# Patient Record
Sex: Male | Born: 1955
Health system: Southern US, Community
[De-identification: ages and names within clinical notes are randomized; demographics above are authoritative.]

## PROBLEM LIST (undated history)

## (undated) DIAGNOSIS — H269 Unspecified cataract: Secondary | ICD-10-CM

## (undated) DIAGNOSIS — Z972 Presence of dental prosthetic device (complete) (partial): Secondary | ICD-10-CM

## (undated) DIAGNOSIS — E7800 Pure hypercholesterolemia, unspecified: Secondary | ICD-10-CM

## (undated) DIAGNOSIS — K635 Polyp of colon: Secondary | ICD-10-CM

## (undated) DIAGNOSIS — Z973 Presence of spectacles and contact lenses: Secondary | ICD-10-CM

## (undated) DIAGNOSIS — M1990 Unspecified osteoarthritis, unspecified site: Secondary | ICD-10-CM

## (undated) DIAGNOSIS — C61 Malignant neoplasm of prostate: Secondary | ICD-10-CM

## (undated) HISTORY — DX: Pure hypercholesterolemia, unspecified: E78.00

## (undated) HISTORY — PX: KNEE ARTHROPLASTY: SHX992

## (undated) HISTORY — DX: Polyp of colon: K63.5

## (undated) HISTORY — PX: PROSTATE BIOPSY: SHX241

## (undated) HISTORY — DX: Unspecified cataract: H26.9

## (undated) HISTORY — DX: Unspecified osteoarthritis, unspecified site: M19.90

## (undated) HISTORY — PX: COLONOSCOPY: SHX174

---

## 2005-03-27 ENCOUNTER — Ambulatory Visit (HOSPITAL_COMMUNITY): Admission: RE | Admit: 2005-03-27 | Discharge: 2005-03-27 | Payer: Self-pay | Admitting: Family Medicine

## 2007-07-13 ENCOUNTER — Ambulatory Visit (HOSPITAL_COMMUNITY): Admission: RE | Admit: 2007-07-13 | Discharge: 2007-07-13 | Payer: Self-pay | Admitting: Gastroenterology

## 2007-07-13 ENCOUNTER — Ambulatory Visit: Payer: Self-pay | Admitting: Gastroenterology

## 2008-07-06 ENCOUNTER — Ambulatory Visit (HOSPITAL_COMMUNITY): Admission: RE | Admit: 2008-07-06 | Discharge: 2008-07-06 | Payer: Self-pay | Admitting: Family Medicine

## 2010-07-04 ENCOUNTER — Ambulatory Visit (HOSPITAL_COMMUNITY): Admission: RE | Admit: 2010-07-04 | Discharge: 2010-07-04 | Payer: Self-pay | Admitting: Family Medicine

## 2010-07-09 ENCOUNTER — Ambulatory Visit: Payer: Self-pay | Admitting: Oncology

## 2010-07-12 LAB — CBC WITH DIFFERENTIAL/PLATELET
BASO%: 0.4 % (ref 0.0–2.0)
Basophils Absolute: 0 10*3/uL (ref 0.0–0.1)
EOS%: 0.8 % (ref 0.0–7.0)
Eosinophils Absolute: 0 10*3/uL (ref 0.0–0.5)
HCT: 41 % (ref 38.4–49.9)
HGB: 13.3 g/dL (ref 13.0–17.1)
LYMPH%: 41.4 % (ref 14.0–49.0)
MCH: 28 pg (ref 27.2–33.4)
MCHC: 32.5 g/dL (ref 32.0–36.0)
MCV: 86.3 fL (ref 79.3–98.0)
MONO#: 0.5 10*3/uL (ref 0.1–0.9)
MONO%: 16.6 % — ABNORMAL HIGH (ref 0.0–14.0)
NEUT#: 1.2 10*3/uL — ABNORMAL LOW (ref 1.5–6.5)
NEUT%: 40.8 % (ref 39.0–75.0)
Platelets: 332 10*3/uL (ref 140–400)
RBC: 4.76 10*6/uL (ref 4.20–5.82)
RDW: 13.7 % (ref 11.0–14.6)
WBC: 2.9 10*3/uL — ABNORMAL LOW (ref 4.0–10.3)
lymph#: 1.2 10*3/uL (ref 0.9–3.3)

## 2010-07-12 LAB — COMPREHENSIVE METABOLIC PANEL
ALT: 15 U/L (ref 0–53)
AST: 17 U/L (ref 0–37)
Albumin: 4.3 g/dL (ref 3.5–5.2)
Alkaline Phosphatase: 58 U/L (ref 39–117)
BUN: 17 mg/dL (ref 6–23)
CO2: 26 mEq/L (ref 19–32)
Calcium: 9.9 mg/dL (ref 8.4–10.5)
Chloride: 105 mEq/L (ref 96–112)
Creatinine, Ser: 0.8 mg/dL (ref 0.40–1.50)
Glucose, Bld: 94 mg/dL (ref 70–99)
Potassium: 4 mEq/L (ref 3.5–5.3)
Sodium: 141 mEq/L (ref 135–145)
Total Bilirubin: 0.4 mg/dL (ref 0.3–1.2)
Total Protein: 7.7 g/dL (ref 6.0–8.3)

## 2010-07-12 LAB — LACTATE DEHYDROGENASE: LDH: 110 U/L (ref 94–250)

## 2010-07-12 LAB — CHCC SMEAR

## 2010-07-27 ENCOUNTER — Ambulatory Visit (HOSPITAL_COMMUNITY): Admission: RE | Admit: 2010-07-27 | Discharge: 2010-07-27 | Payer: Self-pay | Admitting: Family Medicine

## 2010-08-03 ENCOUNTER — Encounter (HOSPITAL_COMMUNITY)
Admission: RE | Admit: 2010-08-03 | Discharge: 2010-09-02 | Payer: Self-pay | Source: Home / Self Care | Attending: Family Medicine | Admitting: Family Medicine

## 2010-09-24 ENCOUNTER — Ambulatory Visit (HOSPITAL_COMMUNITY)
Admission: RE | Admit: 2010-09-24 | Discharge: 2010-09-24 | Payer: Self-pay | Source: Home / Self Care | Attending: Neurosurgery | Admitting: Neurosurgery

## 2010-10-06 ENCOUNTER — Encounter: Payer: Self-pay | Admitting: Family Medicine

## 2010-11-13 ENCOUNTER — Other Ambulatory Visit: Payer: Self-pay | Admitting: Oncology

## 2010-11-13 ENCOUNTER — Encounter (HOSPITAL_BASED_OUTPATIENT_CLINIC_OR_DEPARTMENT_OTHER): Payer: BC Managed Care – PPO | Admitting: Oncology

## 2010-11-13 DIAGNOSIS — D72819 Decreased white blood cell count, unspecified: Secondary | ICD-10-CM

## 2010-11-13 LAB — CBC WITH DIFFERENTIAL/PLATELET
BASO%: 0.8 % (ref 0.0–2.0)
Basophils Absolute: 0 10*3/uL (ref 0.0–0.1)
EOS%: 1.9 % (ref 0.0–7.0)
Eosinophils Absolute: 0.1 10*3/uL (ref 0.0–0.5)
HCT: 38 % — ABNORMAL LOW (ref 38.4–49.9)
HGB: 12.6 g/dL — ABNORMAL LOW (ref 13.0–17.1)
LYMPH%: 50.3 % — ABNORMAL HIGH (ref 14.0–49.0)
MCH: 29 pg (ref 27.2–33.4)
MCHC: 33.3 g/dL (ref 32.0–36.0)
MCV: 87.2 fL (ref 79.3–98.0)
MONO#: 0.6 10*3/uL (ref 0.1–0.9)
MONO%: 15.8 % — ABNORMAL HIGH (ref 0.0–14.0)
NEUT#: 1.2 10*3/uL — ABNORMAL LOW (ref 1.5–6.5)
NEUT%: 31.2 % — ABNORMAL LOW (ref 39.0–75.0)
Platelets: 273 10*3/uL (ref 140–400)
RBC: 4.36 10*6/uL (ref 4.20–5.82)
RDW: 13 % (ref 11.0–14.6)
WBC: 3.7 10*3/uL — ABNORMAL LOW (ref 4.0–10.3)
lymph#: 1.9 10*3/uL (ref 0.9–3.3)

## 2010-11-13 LAB — CHCC SMEAR

## 2011-01-29 NOTE — Op Note (Signed)
NAME:  Ricardo Pacheco, Ricardo Pacheco               ACCOUNT NO.:  [id number]   MEDICAL RECORD NO.:  [id number]          PATIENT TYPE:  AMB   LOCATION:  DAY                           FACILITY:  [facility]   PHYSICIAN:  Kassie Mends, M.D.      DATE OF BIRTH:  [birth year]   DATE OF PROCEDURE:  07/13/2007  DATE OF DISCHARGE:                               OPERATIVE REPORT   REFERRING PHYSICIAN:  Donna Bernard, M.D.   PROCEDURE:  Colonoscopy.   INDICATION FOR EXAM:  Ricardo Pacheco is a 55 year old male who presents for  average-risk colon cancer screening.   FINDINGS:  1. Frequent sigmoid diverticula.  Otherwise no polyps, masses,      inflammatory changes or AVMs seen.  2. Normal retroflexed view of the rectum.   DIAGNOSIS:  Mild sigmoid diverticulosis.   RECOMMENDATIONS:  1. Screening colonoscopy in 10 years.  2. He should follow a high-fiber diet.  He is given a handout on high-      fiber diet and diverticulosis.   MEDICATIONS:  1. Demerol 50 mg IV.  2. Versed 5 mg IV.   PROCEDURE TECHNIQUE:  Physical exam was performed.  Informed consent was  obtained from the patient after explaining the benefits and risks of the  procedure.  The alternatives were discussed.  The patient was connected  to the monitor and placed in the left lateral position.  Continuous  oxygen was provided by nasal cannula and IV medicine administered  through an indwelling cannula.  After administration of sedation and  rectal exam, the  patient's rectum was intubated and the scope was advanced under direct  visualization to the cecum.  The scope was removed slowly by carefully  examining the color, texture, anatomy and integrity of the mucosa on the  way out.  The patient was recovered in endoscopy and discharged home in  satisfactory condition.      Kassie Mends, M.D.  Electronically Signed     SM/MEDQ  D:  07/13/2007  T:  07/13/2007  Job:  [id number]   cc:   Donna Bernard, M.D.  Fax: [phone]

## 2011-03-26 ENCOUNTER — Other Ambulatory Visit: Payer: Self-pay | Admitting: Oncology

## 2011-03-26 ENCOUNTER — Encounter (HOSPITAL_BASED_OUTPATIENT_CLINIC_OR_DEPARTMENT_OTHER): Payer: BC Managed Care – PPO | Admitting: Oncology

## 2011-03-26 DIAGNOSIS — D72819 Decreased white blood cell count, unspecified: Secondary | ICD-10-CM

## 2011-03-26 LAB — COMPREHENSIVE METABOLIC PANEL
ALT: 16 U/L (ref 0–53)
AST: 16 U/L (ref 0–37)
Albumin: 4.1 g/dL (ref 3.5–5.2)
Alkaline Phosphatase: 57 U/L (ref 39–117)
BUN: 14 mg/dL (ref 6–23)
CO2: 25 mEq/L (ref 19–32)
Calcium: 9.6 mg/dL (ref 8.4–10.5)
Chloride: 106 mEq/L (ref 96–112)
Creatinine, Ser: 0.8 mg/dL (ref 0.50–1.35)
Glucose, Bld: 96 mg/dL (ref 70–99)
Potassium: 4 mEq/L (ref 3.5–5.3)
Sodium: 141 mEq/L (ref 135–145)
Total Bilirubin: 0.4 mg/dL (ref 0.3–1.2)
Total Protein: 7.6 g/dL (ref 6.0–8.3)

## 2011-03-26 LAB — CBC WITH DIFFERENTIAL/PLATELET
BASO%: 0.4 % (ref 0.0–2.0)
Basophils Absolute: 0 10*3/uL (ref 0.0–0.1)
EOS%: 1.9 % (ref 0.0–7.0)
Eosinophils Absolute: 0.1 10*3/uL (ref 0.0–0.5)
HCT: 40 % (ref 38.4–49.9)
HGB: 13.5 g/dL (ref 13.0–17.1)
LYMPH%: 38.3 % (ref 14.0–49.0)
MCH: 29.3 pg (ref 27.2–33.4)
MCHC: 33.8 g/dL (ref 32.0–36.0)
MCV: 86.9 fL (ref 79.3–98.0)
MONO#: 0.5 10*3/uL (ref 0.1–0.9)
MONO%: 14.5 % — ABNORMAL HIGH (ref 0.0–14.0)
NEUT#: 1.5 10*3/uL (ref 1.5–6.5)
NEUT%: 44.9 % (ref 39.0–75.0)
Platelets: 297 10*3/uL (ref 140–400)
RBC: 4.61 10*6/uL (ref 4.20–5.82)
RDW: 13.4 % (ref 11.0–14.6)
WBC: 3.2 10*3/uL — ABNORMAL LOW (ref 4.0–10.3)
lymph#: 1.2 10*3/uL (ref 0.9–3.3)

## 2011-08-28 ENCOUNTER — Telehealth: Payer: Self-pay | Admitting: Oncology

## 2011-08-28 NOTE — Telephone Encounter (Signed)
l/m on home # with appt info for 09/2011/aom

## 2011-10-14 ENCOUNTER — Encounter: Payer: Self-pay | Admitting: *Deleted

## 2011-10-16 ENCOUNTER — Ambulatory Visit: Payer: BC Managed Care – PPO | Admitting: Oncology

## 2011-10-16 ENCOUNTER — Other Ambulatory Visit: Payer: BC Managed Care – PPO | Admitting: Lab

## 2011-11-08 ENCOUNTER — Ambulatory Visit (HOSPITAL_BASED_OUTPATIENT_CLINIC_OR_DEPARTMENT_OTHER): Payer: Managed Care, Other (non HMO) | Admitting: Oncology

## 2011-11-08 ENCOUNTER — Other Ambulatory Visit: Payer: Managed Care, Other (non HMO) | Admitting: Lab

## 2011-11-08 DIAGNOSIS — D709 Neutropenia, unspecified: Secondary | ICD-10-CM

## 2011-11-08 LAB — CBC WITH DIFFERENTIAL/PLATELET
BASO%: 0.5 % (ref 0.0–2.0)
Basophils Absolute: 0 10*3/uL (ref 0.0–0.1)
EOS%: 0.9 % (ref 0.0–7.0)
Eosinophils Absolute: 0 10*3/uL (ref 0.0–0.5)
HCT: 41.7 % (ref 38.4–49.9)
HGB: 13.9 g/dL (ref 13.0–17.1)
LYMPH%: 35.4 % (ref 14.0–49.0)
MCH: 29 pg (ref 27.2–33.4)
MCHC: 33.2 g/dL (ref 32.0–36.0)
MCV: 87.4 fL (ref 79.3–98.0)
MONO#: 0.7 10*3/uL (ref 0.1–0.9)
MONO%: 14.4 % — ABNORMAL HIGH (ref 0.0–14.0)
NEUT#: 2.3 10*3/uL (ref 1.5–6.5)
NEUT%: 48.8 % (ref 39.0–75.0)
Platelets: 303 10*3/uL (ref 140–400)
RBC: 4.77 10*6/uL (ref 4.20–5.82)
RDW: 13.7 % (ref 11.0–14.6)
WBC: 4.8 10*3/uL (ref 4.0–10.3)
lymph#: 1.7 10*3/uL (ref 0.9–3.3)

## 2011-11-08 LAB — CHCC SMEAR

## 2011-11-08 NOTE — Progress Notes (Signed)
Hematology and Oncology Follow Up Visit  Ricardo Pacheco [id number] 04-04-1956 56 y.o. 11/08/2011 2:51 PM  CC: Ricardo Pacheco A. Gerda Diss, MD    Principle Diagnosis: This is a 56 year old gentleman with mild neutropenia most likely reactive versus due to ethnic variation.  Interim History: Ricardo Pacheco presents today for a followup visit.  A pleasant gentleman who I saw back in October 2011 for a mild neutropenia with absolute neutrophil count of 1200.  He had normal hemoglobin and hematocrit, normal platelet counts, normal peripheral smear and he is currently on active surveillance.  Since the last time I saw him he had not really reported any new symptoms from what I can tell.  He had not reported any opportunistic infection.  He had not reported any hospitalization.  He had not reported any sinopulmonary infection or skin infection.  His overall performance status and activity level remains excellent.  He does report some occasional mechanical back pain, but really overall unchanged.  Medications: I have reviewed the patient's current medications. Current outpatient prescriptions:chlorzoxazone (PARAFON) 500 MG tablet, Take 500 mg by mouth 4 (four) times daily as needed., Disp: , Rfl: ;  cyclobenzaprine (FLEXERIL) 10 MG tablet, Take 10 mg by mouth 3 (three) times daily as needed., Disp: , Rfl: ;  diazepam (VALIUM) 10 MG tablet, Take 10 mg by mouth every 6 (six) hours as needed., Disp: , Rfl:  HYDROcodone-acetaminophen (VICODIN) 5-500 MG per tablet, Take 1 tablet by mouth every 6 (six) hours as needed., Disp: , Rfl: ;  Multiple Vitamins-Minerals (MULTIVITAMIN PO), Take by mouth., Disp: , Rfl: ;  nabumetone (RELAFEN) 500 MG tablet, Take 500 mg by mouth 2 (two) times daily., Disp: , Rfl:   Allergies: No Known Allergies  Past Medical History, Surgical history, Social history, and Family History were reviewed and updated.  Review of Systems: Constitutional:  Negative for fever, chills, night sweats, anorexia, weight  loss, pain. Cardiovascular: no chest pain or dyspnea on exertion Respiratory: no cough, shortness of breath, or wheezing Neurological: no TIA or stroke symptoms Dermatological: negative ENT: negative Skin: Negative. Gastrointestinal: no abdominal pain, change in bowel habits, or black or bloody stools Genito-Urinary: no dysuria, trouble voiding, or hematuria Hematological and Lymphatic: negative Breast: negative Musculoskeletal: negative Remaining ROS negative. Physical Exam: Blood pressure 105/71, pulse 78, temperature 97.8 F (36.6 C), temperature source Oral, height 6\' 1"  (1.854 m), weight 200 lb 4.8 oz (90.855 kg). ECOG:  General appearance: alert Head: Normocephalic, without obvious abnormality, atraumatic Neck: no adenopathy, no carotid bruit, no JVD, supple, symmetrical, trachea midline and thyroid not enlarged, symmetric, no tenderness/mass/nodules Lymph nodes: Cervical, supraclavicular, and axillary nodes normal. Heart:regular rate and rhythm, S1, S2 normal, no murmur, click, rub or gallop Lung:chest clear, no wheezing, rales, normal symmetric air entry Abdomin: soft, non-tender, without masses or organomegaly EXT:no erythema, induration, or nodules   Lab Results: Lab Results  Component Value Date   WBC 4.8 11/08/2011   HGB 13.9 11/08/2011   HCT 41.7 11/08/2011   MCV 87.4 11/08/2011   PLT 303 11/08/2011     Chemistry      Component Value Date/Time   NA 141 03/26/2011 0937   K 4.0 03/26/2011 0937   CL 106 03/26/2011 0937   CO2 25 03/26/2011 0937   BUN 14 03/26/2011 0937   CREATININE 0.80 03/26/2011 0937      Component Value Date/Time   CALCIUM 9.6 03/26/2011 0937   ALKPHOS 57 03/26/2011 0937   AST 16 03/26/2011 0937   ALT 16 03/26/2011  1610   BILITOT 0.4 03/26/2011 0937      Impression and Plan:  56 year old gentleman with the following issues. 1. Neutropenia.  His absolute neutrophil count today is normal and his neutropenia has resolved.  I do not think he has a  lymphoproliferative disorder.  I think in all likelihood this is a more reactive or ethnic variation related and again it looks like from a benign etiology.  For the time being do not recommend any further intervention.  I do not recommend any bone marrow biopsy.  No further hematology work up is needed. Follow up will be as needed. I will be happy to see him in the future if things change. 2. Back pain that is osteoarthritic in nature.  No major changes, follow up with his PCP  [facility], MD 2/22/20132:51 PM

## 2012-12-11 ENCOUNTER — Ambulatory Visit (INDEPENDENT_AMBULATORY_CARE_PROVIDER_SITE_OTHER): Payer: Managed Care, Other (non HMO) | Admitting: Family Medicine

## 2012-12-11 ENCOUNTER — Encounter: Payer: Self-pay | Admitting: Family Medicine

## 2012-12-11 VITALS — BP 110/70 | Temp 98.3°F | Ht 73.0 in | Wt 216.2 lb

## 2012-12-11 DIAGNOSIS — N400 Enlarged prostate without lower urinary tract symptoms: Secondary | ICD-10-CM

## 2012-12-11 DIAGNOSIS — M722 Plantar fascial fibromatosis: Secondary | ICD-10-CM | POA: Insufficient documentation

## 2012-12-11 DIAGNOSIS — G8929 Other chronic pain: Secondary | ICD-10-CM

## 2012-12-11 DIAGNOSIS — M549 Dorsalgia, unspecified: Secondary | ICD-10-CM

## 2012-12-11 MED ORDER — HYDROCODONE-ACETAMINOPHEN 10-325 MG PO TABS: 1.0000 | ORAL_TABLET | Freq: Four times a day (QID) | ORAL | Status: DC | PRN

## 2012-12-11 MED ORDER — MAGIC MOUTHWASH: 15.0000 mL | Freq: Four times a day (QID) | ORAL | Status: DC

## 2012-12-11 NOTE — Progress Notes (Signed)
  Subjective:    Patient ID: Ricardo Pacheco, male    DOB: [birth year], 57 y.o.   MRN: [id number]  HPI  Feet and ankles swelling intermittently. Worse after on feet. Wears steel toes. Painful heels.exercising regularly. At the gym. Left lumbar pain, chronic. Not yet ready for surg patient also notes soreness of tongue. Used to yeast extract on tongue and developed a sore and painful tongue.. Sensitive now. Claims compliance with his medications. Use his pain medicine sparingly for his chronic back pain. Also on Flomax for his chronic prostate symptoms. ROS otherwise negative.  Review of Systems ROS otherwise negative.    Objective:   Physical Exam Alert vital signs reviewed. HEENT reveals glossitis of tongue neck supple. Lungs clear. Heart rare rhythm. Low back tender to percussion.       Assessment & Plan:  Impression acute glossitis. #2 chronic back pain discussed. #3 prostate hypertrophy discussed. Plan maintain same meds. Medications refilled. followup the summer for physical exam. WSL

## 2012-12-11 NOTE — Patient Instructions (Signed)
Call dr. Nolen Mu in [place] for feet.

## 2012-12-11 NOTE — Progress Notes (Deleted)
  Subjective:    Patient ID: Ricardo Pacheco, male    DOB: 28-Jan-1956, 57 y.o.   MRN: [id number]  HPI Subjective:    Ricardo Pacheco is a 57 y.o. male who presents for evaluation of low back pain. The patient has had {history; pain back:5285::"recurrent self limited episodes of low back pain in the past"}. Symptoms have been present for {1-10:13787} {units:19031} and are {clinical course - history:17::"unchanged"}.  Onset was related to / precipitated by {causes; back pain:32249::"no known injury"}. The pain is located in the {back pain location:31199} and {radiation:20410}. The pain is describedas {pain quality:31200} and occurs {timing:31009}. {Pain rating:20411} Symptoms are exacerbated by {causes; aggravators pain back:31424}. Symptoms are improved by {pain treatments:32172}. He has also tried {pain treatments:32172} Uses pai {Common ambulatory SmartLinks:19316}  Review of Systems {ros - complete:30496}    Objective:   {Exam; back exam:5796::"Full range of motion without pain, no tenderness, no spasm, no curvature.","Normal reflexes, gait, strength and negative straight-leg raise."}    Assessment:    {back diagnosis:16452}    Plan:    {Plan; back pain:10213}    Review of Systems     Objective:   Physical Exam        Assessment & Plan:

## 2013-02-09 ENCOUNTER — Other Ambulatory Visit: Payer: Self-pay | Admitting: Family Medicine

## 2013-04-16 ENCOUNTER — Encounter: Payer: Self-pay | Admitting: Family Medicine

## 2013-04-16 ENCOUNTER — Ambulatory Visit (INDEPENDENT_AMBULATORY_CARE_PROVIDER_SITE_OTHER): Payer: Managed Care, Other (non HMO) | Admitting: Family Medicine

## 2013-04-16 VITALS — BP 120/78 | HR 70 | Ht 71.75 in | Wt 212.0 lb

## 2013-04-16 DIAGNOSIS — Z0000 Encounter for general adult medical examination without abnormal findings: Secondary | ICD-10-CM

## 2013-04-16 DIAGNOSIS — N400 Enlarged prostate without lower urinary tract symptoms: Secondary | ICD-10-CM

## 2013-04-16 DIAGNOSIS — M549 Dorsalgia, unspecified: Secondary | ICD-10-CM

## 2013-04-16 DIAGNOSIS — G8929 Other chronic pain: Secondary | ICD-10-CM

## 2013-04-16 DIAGNOSIS — Z79899 Other long term (current) drug therapy: Secondary | ICD-10-CM

## 2013-04-16 DIAGNOSIS — Z125 Encounter for screening for malignant neoplasm of prostate: Secondary | ICD-10-CM

## 2013-04-16 NOTE — Progress Notes (Signed)
Subjective:    Patient ID: Ricardo Pacheco, male    DOB: [birth year], 57 y.o.   MRN: [id number]  HPI Urinating better up less often at night. Tolerating flomax  Diet over all good Results for orders placed in visit on 11/08/11  CBC WITH DIFFERENTIAL      Result Value Range   WBC 4.8  4.0 - 10.3 10e3/uL   NEUT# 2.3  1.5 - 6.5 10e3/uL   HGB 13.9  13.0 - 17.1 g/dL   HCT 40.9  81.1 - 91.4 %   Platelets 303  140 - 400 10e3/uL   MCV 87.4  79.3 - 98.0 fL   MCH 29.0  27.2 - 33.4 pg   MCHC 33.2  32.0 - 36.0 g/dL   RBC 7.82  9.56 - 2.13 10e6/uL   RDW 13.7  11.0 - 14.6 %   lymph# 1.7  0.9 - 3.3 10e3/uL   MONO# 0.7  0.1 - 0.9 10e3/uL   Eosinophils Absolute 0.0  0.0 - 0.5 10e3/uL   Basophils Absolute 0.0  0.0 - 0.1 10e3/uL   NEUT% 48.8  39.0 - 75.0 %   LYMPH% 35.4  14.0 - 49.0 %   MONO% 14.4 (*) 0.0 - 14.0 %   EOS% 0.9  0.0 - 7.0 %   BASO% 0.5  0.0 - 2.0 %  [facility] SMEAR      Result Value Range   Smear Result Smear Available     Eats mmstly good foods  On veggies and fruits. Chicken and [place] etc.   exercising mostly regularly. Last colonoscopy 2008 and clear for 10 years.  Review of Systems  Constitutional: Negative for fever, activity change and appetite change.  HENT: Negative for congestion, rhinorrhea and neck pain.   Eyes: Negative for discharge.  Respiratory: Negative for cough and wheezing.   Cardiovascular: Negative for chest pain.  Gastrointestinal: Negative for vomiting, abdominal pain and blood in stool.  Genitourinary: Positive for frequency. Negative for difficulty urinating.  Musculoskeletal: Positive for back pain.  Skin: Negative for rash.  Allergic/Immunologic: Negative for environmental allergies and food allergies.  Neurological: Negative for weakness and headaches.  Psychiatric/Behavioral: Negative for agitation.  All other systems reviewed and are negative.       Objective:   Physical Exam  Vitals reviewed. Constitutional: He appears well-developed and  well-nourished.  HENT:  Head: Normocephalic and atraumatic.  Right Ear: External ear normal.  Left Ear: External ear normal.  Nose: Nose normal.  Mouth/Throat: Oropharynx is clear and moist.  Eyes: EOM are normal. Pupils are equal, round, and reactive to light.  Neck: Normal range of motion. Neck supple. No thyromegaly present.  Cardiovascular: Normal rate, regular rhythm and normal heart sounds.   No murmur heard. Pulmonary/Chest: Effort normal and breath sounds normal. No respiratory distress. He has no wheezes.  Abdominal: Soft. Bowel sounds are normal. He exhibits no distension and no mass. There is no tenderness.  Genitourinary: Penis normal.  Musculoskeletal: Normal range of motion. He exhibits no edema.  Lymphadenopathy:    He has no cervical adenopathy.  Neurological: He is alert. He exhibits normal muscle tone.  Skin: Skin is warm and dry. No erythema.  Multiple areas associated with actinic keratosis  Psychiatric: He has a normal mood and affect. His behavior is normal. Judgment normal.          Assessment & Plan:  Impression wellness exam #2 chronic back pain. Patient uses hydrocodone. We prescribed 40 per prescription. #3 prostate hypertrophy plan appropriate blood  work. Hemoccult cards. Diet exercise discussed in encourage. Advised we'll likely need to see in 6 months with new regulations regarding narcotic prescriptions.

## 2013-04-24 LAB — BASIC METABOLIC PANEL
BUN: 17 mg/dL (ref 6–23)
CO2: 31 mEq/L (ref 19–32)
Calcium: 9.6 mg/dL (ref 8.4–10.5)
Chloride: 104 mEq/L (ref 96–112)
Creat: 0.92 mg/dL (ref 0.50–1.35)
Glucose, Bld: 97 mg/dL (ref 70–99)
Potassium: 4.7 mEq/L (ref 3.5–5.3)
Sodium: 139 mEq/L (ref 135–145)

## 2013-04-24 LAB — HEPATIC FUNCTION PANEL
ALT: 17 U/L (ref 0–53)
AST: 18 U/L (ref 0–37)
Albumin: 3.9 g/dL (ref 3.5–5.2)
Alkaline Phosphatase: 62 U/L (ref 39–117)
Bilirubin, Direct: 0.1 mg/dL (ref 0.0–0.3)
Indirect Bilirubin: 0.3 mg/dL (ref 0.0–0.9)
Total Bilirubin: 0.4 mg/dL (ref 0.3–1.2)
Total Protein: 7.3 g/dL (ref 6.0–8.3)

## 2013-04-24 LAB — LIPID PANEL
Cholesterol: 206 mg/dL — ABNORMAL HIGH (ref 0–200)
HDL: 57 mg/dL (ref >39)
LDL Cholesterol: 137 mg/dL — ABNORMAL HIGH (ref 0–99)
Total CHOL/HDL Ratio: 3.6 Ratio
Triglycerides: 59 mg/dL (ref <150)
VLDL: 12 mg/dL (ref 0–40)

## 2013-04-24 LAB — PSA: PSA: 1.42 ng/mL (ref <=4.00)

## 2013-04-25 ENCOUNTER — Encounter: Payer: Self-pay | Admitting: Family Medicine

## 2013-05-03 ENCOUNTER — Other Ambulatory Visit (INDEPENDENT_AMBULATORY_CARE_PROVIDER_SITE_OTHER): Payer: Managed Care, Other (non HMO) | Admitting: *Deleted

## 2013-05-03 DIAGNOSIS — Z0000 Encounter for general adult medical examination without abnormal findings: Secondary | ICD-10-CM

## 2013-05-03 LAB — POC HEMOCCULT BLD/STL (HOME/3-CARD/SCREEN)
Card #2 Fecal Occult Blod, POC: NEGATIVE
Card #3 Fecal Occult Blood, POC: NEGATIVE
Fecal Occult Blood, POC: NEGATIVE

## 2013-07-09 ENCOUNTER — Telehealth: Payer: Self-pay | Admitting: Family Medicine

## 2013-07-09 ENCOUNTER — Other Ambulatory Visit: Payer: Self-pay

## 2013-07-09 DIAGNOSIS — G8929 Other chronic pain: Secondary | ICD-10-CM

## 2013-07-09 MED ORDER — HYDROCODONE-ACETAMINOPHEN 10-325 MG PO TABS: 1.0000 | ORAL_TABLET | Freq: Four times a day (QID) | ORAL | Status: DC | PRN

## 2013-07-09 NOTE — Telephone Encounter (Signed)
Last office visit 04/16/13.

## 2013-07-09 NOTE — Telephone Encounter (Signed)
° °  Medication HYDROcodone-acetaminophen (NORCO) 10-325 MG per tablet [[id number]]                                 HYDROcodone-acetaminophen (NORCO) 10-325 MG per tablet [[id number]]   Order Details    Dose: 1 tablet Route: Oral Frequency: Every 6 hours PRN for pain    Dispense Quantity: 40 tablet Refills: 5 Fills Remaining: 5            Sig: Take 1 tablet by mouth every 6 (six) hours as needed for pain.           Written Date: 12/11/12 Expiration Date: 06/09/13      Start Date: 12/11/12 End Date: --      Ordering Provider: -- Authorizing Provider: Merlyn Albert, MD Ordering User: Merlyn Albert, MD            Diagnosis Association: Chronic back pain (724.5 , 338.29)           Original Order: HYDROcodone-acetaminophen (NORCO) 10-325 MG per tablet [[id number]]        Pharmacy: [facility] - [place], [place] - [address]       Pharmacy Comments: --           Quantity Remaining: 200 tablet Quantity Filled: 0 tablet             Order Class    Print            All Administrations of HYDROcodone-acetaminophen (NORCO) 10-325 MG per tablet    No Administrations Recorded                  Warnings History    No Warning History Available                Order History Outpatient    Date/Time Action Taken User Additional Information   12/11/12 1504 Sign Merlyn Albert, MD Reorder from Order: [phone]              HYDROcodone-acetaminophen (NORCO) 10-325 MG per tablet   Please refill

## 2013-07-09 NOTE — Telephone Encounter (Signed)
Ok times one. Explain new situation

## 2013-07-09 NOTE — Telephone Encounter (Signed)
Notified patient prescription is ready for pickup. Explained to patient the new guidelines for pain medication.

## 2013-09-15 ENCOUNTER — Encounter: Payer: Self-pay | Admitting: Family Medicine

## 2013-09-15 ENCOUNTER — Ambulatory Visit (INDEPENDENT_AMBULATORY_CARE_PROVIDER_SITE_OTHER): Payer: Managed Care, Other (non HMO) | Admitting: Family Medicine

## 2013-09-15 VITALS — BP 132/88 | Ht 73.0 in | Wt 220.4 lb

## 2013-09-15 DIAGNOSIS — G8929 Other chronic pain: Secondary | ICD-10-CM

## 2013-09-15 DIAGNOSIS — M549 Dorsalgia, unspecified: Secondary | ICD-10-CM

## 2013-09-15 MED ORDER — HYDROCODONE-ACETAMINOPHEN 10-325 MG PO TABS: 1.0000 | ORAL_TABLET | Freq: Four times a day (QID) | ORAL | Status: DC | PRN

## 2013-09-15 MED ORDER — NABUMETONE 500 MG PO TABS: ORAL_TABLET | ORAL | Status: DC

## 2013-09-15 MED ORDER — TAMSULOSIN HCL 0.4 MG PO CAPS: 0.4000 mg | ORAL_CAPSULE | Freq: Every day | ORAL | Status: DC

## 2013-09-15 MED ORDER — CYCLOBENZAPRINE HCL 10 MG PO TABS: ORAL_TABLET | ORAL | Status: DC

## 2013-09-15 NOTE — Progress Notes (Signed)
   Subjective:    Patient ID: Ricardo Pacheco, male    DOB: 20-Dec-1955, 57 y.o.   MRN: [id number]  HPI Patient is here today to get a refill on his medications, especially his Hydrocodone.   Patient has no concerns.  Uses it two or three times per wk,  Back pain when it does flare up is fairly severe. Primarily in the lumbar region. Please see prior notes. Not surgical at this time. Needs hydrocodone to help the severe pain. Anti-inflammatory medications are not enough.   Review of Systems No chest pain no change in urinary or bowel habits no blood in stool ROS otherwise negative    Objective:   Physical Exam  Alert no apparent distress vitals stable. Lungs clear. Heart regular in rhythm. Lumbar region tender to palpation. Negative to straight leg raise today.      Assessment & Plan:  Impression 1 chronic back pain with need for narcotics-discussed plan exercise encourage medicines refilled. #60 hydrocodone with one refill. Patient uses only sporadically so we can see every 6 months. Do not forget to do screening physicals. WSL

## 2014-01-01 ENCOUNTER — Other Ambulatory Visit: Payer: Self-pay | Admitting: Family Medicine

## 2014-01-03 NOTE — Telephone Encounter (Signed)
Last seen 09/15/13

## 2014-05-06 ENCOUNTER — Encounter: Payer: Self-pay | Admitting: Family Medicine

## 2014-05-06 ENCOUNTER — Ambulatory Visit (INDEPENDENT_AMBULATORY_CARE_PROVIDER_SITE_OTHER): Payer: Managed Care, Other (non HMO) | Admitting: Family Medicine

## 2014-05-06 VITALS — BP 120/84 | Ht 71.75 in | Wt 215.0 lb

## 2014-05-06 DIAGNOSIS — Z0000 Encounter for general adult medical examination without abnormal findings: Secondary | ICD-10-CM

## 2014-05-06 DIAGNOSIS — M549 Dorsalgia, unspecified: Secondary | ICD-10-CM

## 2014-05-06 DIAGNOSIS — G8929 Other chronic pain: Secondary | ICD-10-CM

## 2014-05-06 DIAGNOSIS — Z79899 Other long term (current) drug therapy: Secondary | ICD-10-CM

## 2014-05-06 DIAGNOSIS — Z125 Encounter for screening for malignant neoplasm of prostate: Secondary | ICD-10-CM

## 2014-05-06 MED ORDER — HYDROCODONE-ACETAMINOPHEN 10-325 MG PO TABS: 1.0000 | ORAL_TABLET | Freq: Four times a day (QID) | ORAL | Status: DC | PRN
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
## 2014-05-07 ENCOUNTER — Encounter: Payer: Self-pay | Admitting: *Deleted

## 2014-05-07 LAB — BASIC METABOLIC PANEL
BUN: 18 mg/dL (ref 6–23)
CO2: 27 mEq/L (ref 19–32)
Calcium: 9.1 mg/dL (ref 8.4–10.5)
Chloride: 106 mEq/L (ref 96–112)
Creat: 0.81 mg/dL (ref 0.50–1.35)
Glucose, Bld: 109 mg/dL — ABNORMAL HIGH (ref 70–99)
Potassium: 4 mEq/L (ref 3.5–5.3)
Sodium: 140 mEq/L (ref 135–145)

## 2014-05-07 LAB — LIPID PANEL
Cholesterol: 207 mg/dL — ABNORMAL HIGH (ref 0–200)
HDL: 64 mg/dL (ref >39)
LDL Cholesterol: 134 mg/dL — ABNORMAL HIGH (ref 0–99)
Total CHOL/HDL Ratio: 3.2 Ratio
Triglycerides: 47 mg/dL (ref <150)
VLDL: 9 mg/dL (ref 0–40)

## 2014-05-07 LAB — HEPATIC FUNCTION PANEL
ALT: 20 U/L (ref 0–53)
AST: 16 U/L (ref 0–37)
Albumin: 4 g/dL (ref 3.5–5.2)
Alkaline Phosphatase: 54 U/L (ref 39–117)
Bilirubin, Direct: 0.1 mg/dL (ref 0.0–0.3)
Indirect Bilirubin: 0.3 mg/dL (ref 0.2–1.2)
Total Bilirubin: 0.4 mg/dL (ref 0.2–1.2)
Total Protein: 7.2 g/dL (ref 6.0–8.3)

## 2014-05-09 LAB — PSA: PSA: 1.35 ng/mL (ref <=4.00)

## 2014-05-17 LAB — POC HEMOCCULT BLD/STL (HOME/3-CARD/SCREEN)
Card #2 Fecal Occult Blod, POC: NEGATIVE
Card #3 Fecal Occult Blood, POC: NEGATIVE
Fecal Occult Blood, POC: NEGATIVE

## 2014-09-05 ENCOUNTER — Other Ambulatory Visit: Payer: Self-pay | Admitting: Family Medicine

## 2015-05-05 ENCOUNTER — Telehealth: Payer: Self-pay | Admitting: Family Medicine

## 2015-05-05 DIAGNOSIS — Z13220 Encounter for screening for lipoid disorders: Secondary | ICD-10-CM

## 2015-05-05 DIAGNOSIS — N400 Enlarged prostate without lower urinary tract symptoms: Secondary | ICD-10-CM

## 2015-05-05 DIAGNOSIS — Z79899 Other long term (current) drug therapy: Secondary | ICD-10-CM

## 2015-05-05 NOTE — Telephone Encounter (Signed)
Pt needs labs for appt,  Aware of [facility]  Last labs 05/06/14   Hep, PSA, BMP, Lip

## 2015-05-08 NOTE — Addendum Note (Signed)
Addended by: Launa Grill on: 05/08/2015 09:08 AM   Modules accepted: Orders

## 2015-05-08 NOTE — Telephone Encounter (Signed)
Spoke with patient and informed him that the following labs were ordered for [facility]: Hepatic, PSA, BMP, and Lipid. Patient verbalized understanding.

## 2015-05-08 NOTE — Telephone Encounter (Signed)
Rep same 

## 2015-05-13 LAB — BASIC METABOLIC PANEL
BUN/Creatinine Ratio: 21 — ABNORMAL HIGH (ref 9–20)
BUN: 16 mg/dL (ref 6–24)
CO2: 24 mmol/L (ref 18–29)
Calcium: 9.5 mg/dL (ref 8.7–10.2)
Chloride: 104 mmol/L (ref 97–108)
Creatinine, Ser: 0.78 mg/dL (ref 0.76–1.27)
GFR calc Af Amer: 114 mL/min/{1.73_m2} (ref >59)
GFR calc non Af Amer: 99 mL/min/{1.73_m2} (ref >59)
Glucose: 101 mg/dL — ABNORMAL HIGH (ref 65–99)
Potassium: 4.1 mmol/L (ref 3.5–5.2)
Sodium: 142 mmol/L (ref 134–144)

## 2015-05-13 LAB — PSA: Prostate Specific Ag, Serum: 1.7 ng/mL (ref 0.0–4.0)

## 2015-05-13 LAB — LIPID PANEL
Chol/HDL Ratio: 3.6 ratio units (ref 0.0–5.0)
Cholesterol, Total: 204 mg/dL — ABNORMAL HIGH (ref 100–199)
HDL: 56 mg/dL (ref >39)
LDL Calculated: 137 mg/dL — ABNORMAL HIGH (ref 0–99)
Triglycerides: 56 mg/dL (ref 0–149)
VLDL Cholesterol Cal: 11 mg/dL (ref 5–40)

## 2015-05-13 LAB — HEPATIC FUNCTION PANEL
ALT: 19 IU/L (ref 0–44)
AST: 17 IU/L (ref 0–40)
Albumin: 3.9 g/dL (ref 3.5–5.5)
Alkaline Phosphatase: 61 IU/L (ref 39–117)
Bilirubin Total: 0.2 mg/dL (ref 0.0–1.2)
Bilirubin, Direct: 0.08 mg/dL (ref 0.00–0.40)
Total Protein: 7.3 g/dL (ref 6.0–8.5)

## 2015-05-19 ENCOUNTER — Ambulatory Visit (INDEPENDENT_AMBULATORY_CARE_PROVIDER_SITE_OTHER): Payer: Managed Care, Other (non HMO) | Admitting: Family Medicine

## 2015-05-19 ENCOUNTER — Encounter: Payer: Self-pay | Admitting: Family Medicine

## 2015-05-19 VITALS — BP 110/64 | Ht 71.25 in | Wt 220.0 lb

## 2015-05-19 DIAGNOSIS — Z0000 Encounter for general adult medical examination without abnormal findings: Secondary | ICD-10-CM

## 2015-05-19 DIAGNOSIS — G8929 Other chronic pain: Secondary | ICD-10-CM | POA: Diagnosis not present

## 2015-05-19 DIAGNOSIS — M549 Dorsalgia, unspecified: Secondary | ICD-10-CM | POA: Diagnosis not present

## 2015-05-19 DIAGNOSIS — N400 Enlarged prostate without lower urinary tract symptoms: Secondary | ICD-10-CM | POA: Diagnosis not present

## 2015-05-19 MED ORDER — HYDROCODONE-ACETAMINOPHEN 10-325 MG PO TABS: 1.0000 | ORAL_TABLET | Freq: Four times a day (QID) | ORAL | Status: DC | PRN

## 2015-05-19 MED ORDER — CHLORZOXAZONE 500 MG PO TABS: ORAL_TABLET | ORAL | Status: DC

## 2015-05-19 MED ORDER — NABUMETONE 500 MG PO TABS: ORAL_TABLET | ORAL | Status: DC

## 2015-05-19 MED ORDER — CYCLOBENZAPRINE HCL 10 MG PO TABS: ORAL_TABLET | ORAL | Status: DC

## 2015-05-19 MED ORDER — TAMSULOSIN HCL 0.4 MG PO CAPS: 0.4000 mg | ORAL_CAPSULE | Freq: Every day | ORAL | Status: DC

## 2015-05-19 NOTE — Patient Instructions (Signed)
Results for orders placed or performed in visit on 05/05/15  Hepatic function panel  Result Value Ref Range   Total Protein 7.3 6.0 - 8.5 g/dL   Albumin 3.9 3.5 - 5.5 g/dL   Bilirubin Total 0.2 0.0 - 1.2 mg/dL   Bilirubin, Direct 0.08 0.00 - 0.40 mg/dL   Alkaline Phosphatase 61 39 - 117 IU/L   AST 17 0 - 40 IU/L   ALT 19 0 - 44 IU/L  PSA  Result Value Ref Range   Prostate Specific Ag, Serum 1.7 0.0 - 4.0 ng/mL  Basic metabolic panel  Result Value Ref Range   Glucose 101 (H) 65 - 99 mg/dL   BUN 16 6 - 24 mg/dL   Creatinine, Ser 0.78 0.76 - 1.27 mg/dL   GFR calc non Af Amer 99 >59 mL/min/1.73   GFR calc Af Amer 114 >59 mL/min/1.73   BUN/Creatinine Ratio 21 (H) 9 - 20   Sodium 142 134 - 144 mmol/L   Potassium 4.1 3.5 - 5.2 mmol/L   Chloride 104 97 - 108 mmol/L   CO2 24 18 - 29 mmol/L   Calcium 9.5 8.7 - 10.2 mg/dL  Lipid panel  Result Value Ref Range   Cholesterol, Total 204 (H) 100 - 199 mg/dL   Triglycerides 56 0 - 149 mg/dL   HDL 56 >39 mg/dL   VLDL Cholesterol Cal 11 5 - 40 mg/dL   LDL Calculated 137 (H) 0 - 99 mg/dL   Chol/HDL Ratio 3.6 0.0 - 5.0 ratio units

## 2015-05-19 NOTE — Progress Notes (Signed)
Subjective:    Patient ID: Ricardo Pacheco, male    DOB: [birth year], 59 y.o.   MRN: [id number]  HPI The patient comes in today for a wellness visit.  2008 neg colon ten yr rec  A review of their health history was completed.  A review of medications was also completed.  Any needed refills; need refills on all meds. Takes hydrocodone for back pain. Med helps with back pain.Back pain off and on, causing some challenges.  Uses prn hydrocodone, recently flared up   Pt states he usually gets two scripts and that last until his next physical.   Eating habits: health conscious  Falls/  MVA accidents in past few months: none  Regular exercise: walks most days, goes to gym some  Specialist pt sees on regular basis: none  Preventative health issues were discussed.   Additional concerns: fatigue. Wants something to boost energy. Over the past few months, noticed fatigue and tiredness,  Not always the best sleep.  Just one coffee daily at work Results for orders placed or performed in visit on 05/05/15  Hepatic function panel  Result Value Ref Range   Total Protein 7.3 6.0 - 8.5 g/dL   Albumin 3.9 3.5 - 5.5 g/dL   Bilirubin Total 0.2 0.0 - 1.2 mg/dL   Bilirubin, Direct 0.08 0.00 - 0.40 mg/dL   Alkaline Phosphatase 61 39 - 117 IU/L   AST 17 0 - 40 IU/L   ALT 19 0 - 44 IU/L  PSA  Result Value Ref Range   Prostate Specific Ag, Serum 1.7 0.0 - 4.0 ng/mL  Basic metabolic panel  Result Value Ref Range   Glucose 101 (H) 65 - 99 mg/dL   BUN 16 6 - 24 mg/dL   Creatinine, Ser 0.78 0.76 - 1.27 mg/dL   GFR calc non Af Amer 99 >59 mL/min/1.73   GFR calc Af Amer 114 >59 mL/min/1.73   BUN/Creatinine Ratio 21 (H) 9 - 20   Sodium 142 134 - 144 mmol/L   Potassium 4.1 3.5 - 5.2 mmol/L   Chloride 104 97 - 108 mmol/L   CO2 24 18 - 29 mmol/L   Calcium 9.5 8.7 - 10.2 mg/dL  Lipid panel  Result Value Ref Range   Cholesterol, Total 204 (H) 100 - 199 mg/dL   Triglycerides 56 0 - 149 mg/dL   HDL 56 >39 mg/dL   VLDL Cholesterol Cal 11 5 - 40 mg/dL   LDL Calculated 137 (H) 0 - 99 mg/dL   Chol/HDL Ratio 3.6 0.0 - 5.0 ratio units    For a while now, running tired Back pain.      Review of Systems  Constitutional: Negative for fever, activity change and appetite change.  HENT: Negative for congestion and rhinorrhea.   Eyes: Negative for discharge.  Respiratory: Negative for cough and wheezing.   Cardiovascular: Negative for chest pain.  Gastrointestinal: Negative for vomiting, abdominal pain and blood in stool.  Genitourinary: Negative for frequency and difficulty urinating.  Musculoskeletal: Negative for neck pain.  Skin: Negative for rash.  Allergic/Immunologic: Negative for environmental allergies and food allergies.  Neurological: Negative for weakness and headaches.  Psychiatric/Behavioral: Negative for agitation.  All other systems reviewed and are negative.  medications for prostate hypertrophy still working and handling well.     Objective:   Physical Exam  Constitutional: He appears well-developed and well-nourished.  HENT:  Head: Normocephalic and atraumatic.  Right Ear: External ear normal.  Left Ear: External ear normal.  Nose: Nose normal.  Mouth/Throat: Oropharynx is clear and moist.  Eyes: EOM are normal. Pupils are equal, round, and reactive to light.  Neck: Normal range of motion. Neck supple. No thyromegaly present.  Cardiovascular: Normal rate, regular rhythm and normal heart sounds.   No murmur heard. Pulmonary/Chest: Effort normal and breath sounds normal. No respiratory distress. He has no wheezes.  Abdominal: Soft. Bowel sounds are normal. He exhibits no distension and no mass. There is no tenderness.  Genitourinary: Penis normal.  Prostate exam within normal limits  Musculoskeletal: Normal range of motion. He exhibits no edema.  Lymphadenopathy:    He has no cervical adenopathy.  Neurological: He is alert. He exhibits normal muscle  tone.  Skin: Skin is warm and dry. No erythema.  Psychiatric: He has a normal mood and affect. His behavior is normal. Judgment normal.  Vitals reviewed.         Assessment & Plan:  Impression #1 well adult exam #2 chronic back pain discussed. Patient definitely needs occasional narcotics to help his flares. #3 fatigue discussed. Likely secondary to excessive work and heat and suboptimum sleep. Plan blood work discussed. Medications refilled. Narcotics written for. Local measures discussed recheck in one year WSL

## 2015-08-23 ENCOUNTER — Ambulatory Visit (HOSPITAL_COMMUNITY)
Admission: RE | Admit: 2015-08-23 | Discharge: 2015-08-23 | Disposition: A | Discharge status: Home / Self Care | Payer: Managed Care, Other (non HMO) | Source: Ambulatory Visit | Attending: Family Medicine | Admitting: Family Medicine

## 2015-08-23 ENCOUNTER — Ambulatory Visit (INDEPENDENT_AMBULATORY_CARE_PROVIDER_SITE_OTHER): Payer: Managed Care, Other (non HMO) | Admitting: Family Medicine

## 2015-08-23 ENCOUNTER — Encounter: Payer: Self-pay | Admitting: Family Medicine

## 2015-08-23 VITALS — BP 120/80 | Ht 71.25 in | Wt 227.1 lb

## 2015-08-23 DIAGNOSIS — M79644 Pain in right finger(s): Secondary | ICD-10-CM | POA: Diagnosis present

## 2015-08-23 DIAGNOSIS — M19041 Primary osteoarthritis, right hand: Secondary | ICD-10-CM | POA: Insufficient documentation

## 2015-08-23 NOTE — Progress Notes (Signed)
   Subjective:    Patient ID: Ricardo Pacheco, male    DOB: 11-03-1955, 59 y.o.   MRN: [id number]  HPI Patient had injury to finger several weeks ago. Finger has been swollen and painful to move. Has tried finger splints, and pain patches.   patient is tried various things without success to getting it to go away. He states injury occurred when a heavy metal door hit it he describes pain and discomfort over the past several weeks Review of Systems  pain and discomfort in the PIP joint has difficult time making fists extending his hand all the way denies wrist pain elbow pain    Objective:   Physical Exam   swelling of the PIP joint noted with decreased range of motion has difficult time flexing Foley can extend it ligaments are stable  no wrist tenderness no forearm tenderness     Assessment & Plan:   contusion of the middle finger noted with some swelling of the PIP joint decreased range of motion x-rays indicated gentle range of motion exercises recommended anti-inflammatory when necessary if ongoing trouble follow-up    patient was told if x-rays negative ongoing trouble next [facility] physical therapy call us if any problems

## 2016-05-02 ENCOUNTER — Telehealth: Payer: Self-pay | Admitting: Family Medicine

## 2016-05-02 DIAGNOSIS — Z125 Encounter for screening for malignant neoplasm of prostate: Secondary | ICD-10-CM

## 2016-05-02 DIAGNOSIS — Z13220 Encounter for screening for lipoid disorders: Secondary | ICD-10-CM

## 2016-05-02 DIAGNOSIS — Z79899 Other long term (current) drug therapy: Secondary | ICD-10-CM

## 2016-05-02 NOTE — Telephone Encounter (Signed)
Repeat same 

## 2016-05-02 NOTE — Telephone Encounter (Signed)
bw orders please for PE in sept   Last labs 05/12/15 Lip, BMP, PSA, Hep,

## 2016-05-03 NOTE — Telephone Encounter (Signed)
Left message on voicemail notifying patient that blood work has been ordered.  

## 2016-05-11 LAB — LIPID PANEL
Chol/HDL Ratio: 3.2 ratio units (ref 0.0–5.0)
Cholesterol, Total: 198 mg/dL (ref 100–199)
HDL: 61 mg/dL (ref >39)
LDL Calculated: 127 mg/dL — ABNORMAL HIGH (ref 0–99)
Triglycerides: 51 mg/dL (ref 0–149)
VLDL Cholesterol Cal: 10 mg/dL (ref 5–40)

## 2016-05-11 LAB — BASIC METABOLIC PANEL
BUN/Creatinine Ratio: 16 (ref 10–24)
BUN: 14 mg/dL (ref 8–27)
CO2: 26 mmol/L (ref 18–29)
Calcium: 9.4 mg/dL (ref 8.6–10.2)
Chloride: 104 mmol/L (ref 96–106)
Creatinine, Ser: 0.85 mg/dL (ref 0.76–1.27)
GFR calc Af Amer: 109 mL/min/{1.73_m2} (ref >59)
GFR calc non Af Amer: 95 mL/min/{1.73_m2} (ref >59)
Glucose: 95 mg/dL (ref 65–99)
Potassium: 4 mmol/L (ref 3.5–5.2)
Sodium: 142 mmol/L (ref 134–144)

## 2016-05-11 LAB — HEPATIC FUNCTION PANEL
ALT: 20 IU/L (ref 0–44)
AST: 19 IU/L (ref 0–40)
Albumin: 4.1 g/dL (ref 3.6–4.8)
Alkaline Phosphatase: 71 IU/L (ref 39–117)
Bilirubin Total: 0.3 mg/dL (ref 0.0–1.2)
Bilirubin, Direct: 0.1 mg/dL (ref 0.00–0.40)
Total Protein: 7.2 g/dL (ref 6.0–8.5)

## 2016-05-11 LAB — PSA: Prostate Specific Ag, Serum: 2.5 ng/mL (ref 0.0–4.0)

## 2016-05-17 ENCOUNTER — Encounter: Payer: Self-pay | Admitting: Family Medicine

## 2016-05-17 ENCOUNTER — Ambulatory Visit (INDEPENDENT_AMBULATORY_CARE_PROVIDER_SITE_OTHER): Payer: 59 | Admitting: Family Medicine

## 2016-05-17 VITALS — BP 138/90 | Ht 71.25 in | Wt 216.0 lb

## 2016-05-17 DIAGNOSIS — N400 Enlarged prostate without lower urinary tract symptoms: Secondary | ICD-10-CM

## 2016-05-17 DIAGNOSIS — M549 Dorsalgia, unspecified: Secondary | ICD-10-CM | POA: Diagnosis not present

## 2016-05-17 DIAGNOSIS — G8929 Other chronic pain: Secondary | ICD-10-CM | POA: Diagnosis not present

## 2016-05-17 DIAGNOSIS — Z0000 Encounter for general adult medical examination without abnormal findings: Secondary | ICD-10-CM

## 2016-05-17 MED ORDER — NABUMETONE 500 MG PO TABS: ORAL_TABLET | ORAL | 11 refills | Status: DC

## 2016-05-17 MED ORDER — TAMSULOSIN HCL 0.4 MG PO CAPS: 0.4000 mg | ORAL_CAPSULE | Freq: Every day | ORAL | 11 refills | Status: DC

## 2016-05-17 MED ORDER — HYDROCODONE-ACETAMINOPHEN 10-325 MG PO TABS: 1.0000 | ORAL_TABLET | Freq: Four times a day (QID) | ORAL | 0 refills | Status: DC | PRN

## 2016-05-17 MED ORDER — CYCLOBENZAPRINE HCL 10 MG PO TABS: ORAL_TABLET | ORAL | 11 refills | Status: DC

## 2016-05-17 NOTE — Progress Notes (Signed)
Subjective:    Patient ID: Zahkai Pacheco, male    DOB: 08-21-1956, 60 y.o.   MRN: [id number]  HPI The patient comes in today for a wellness visit.  Results for orders placed or performed in visit on 05/02/16  Lipid panel  Result Value Ref Range   Cholesterol, Total 198 100 - 199 mg/dL   Triglycerides 51 0 - 149 mg/dL   HDL 61 >39 mg/dL   VLDL Cholesterol Cal 10 5 - 40 mg/dL   LDL Calculated 127 (H) 0 - 99 mg/dL   Chol/HDL Ratio 3.2 0.0 - 5.0 ratio units  Basic metabolic panel  Result Value Ref Range   Glucose 95 65 - 99 mg/dL   BUN 14 8 - 27 mg/dL   Creatinine, Ser 0.85 0.76 - 1.27 mg/dL   GFR calc non Af Amer 95 >59 mL/min/1.73   GFR calc Af Amer 109 >59 mL/min/1.73   BUN/Creatinine Ratio 16 10 - 24   Sodium 142 134 - 144 mmol/L   Potassium 4.0 3.5 - 5.2 mmol/L   Chloride 104 96 - 106 mmol/L   CO2 26 18 - 29 mmol/L   Calcium 9.4 8.6 - 10.2 mg/dL  PSA  Result Value Ref Range   Prostate Specific Ag, Serum 2.5 0.0 - 4.0 ng/mL  Hepatic function panel  Result Value Ref Range   Total Protein 7.2 6.0 - 8.5 g/dL   Albumin 4.1 3.6 - 4.8 g/dL   Bilirubin Total 0.3 0.0 - 1.2 mg/dL   Bilirubin, Direct 0.10 0.00 - 0.40 mg/dL   Alkaline Phosphatase 71 39 - 117 IU/L   AST 19 0 - 40 IU/L   ALT 20 0 - 44 IU/L   Brothers with pros t canc er    A review of their health history was completed.  A review of medications was also completed.  Any needed refills; nabumetone, tamsulosin, hydrocodone, flexeril  Using the pain med faith fully     Eating habits: health conscious  Falls/  MVA accidents in past few months: none  Regular exercise: walking and swimming, t mill whene ver possib;e  Patient dosed with chronic back pain. Uses Relafen when necessary. As hydrocodone when necessary during bad flares. Overall this worked well for him. Patient states needs in order to maintain function.  Still on Flomax. States deftly helping with urinating. No obvious side effects. Definitely  would like to remain on  Specialist pt sees on regular basis: none  Preventative health issues were discussed.   Additional concerns: none    Review of Systems  Constitutional: Negative.  Negative for activity change, appetite change and fever.  HENT: Negative for congestion and rhinorrhea.   Eyes: Negative for discharge.  Respiratory: Negative for cough and wheezing.   Cardiovascular: Negative for chest pain.  Gastrointestinal: Negative for abdominal pain, blood in stool and vomiting.  Genitourinary: Negative for difficulty urinating and frequency.  Musculoskeletal: Negative for neck pain.  Skin: Negative for rash.  Allergic/Immunologic: Negative for environmental allergies and food allergies.  Neurological: Negative for weakness and headaches.  Psychiatric/Behavioral: Negative for agitation.  All other systems reviewed and are negative.      Objective:   Physical Exam  Constitutional: He appears well-developed and well-nourished.  HENT:  Head: Normocephalic and atraumatic.  Right Ear: External ear normal.  Left Ear: External ear normal.  Nose: Nose normal.  Mouth/Throat: Oropharynx is clear and moist.  Eyes: EOM are normal. Pupils are equal, round, and reactive to light.  Neck:  Normal range of motion. Neck supple. No thyromegaly present.  Cardiovascular: Normal rate, regular rhythm and normal heart sounds.   No murmur heard. Pulmonary/Chest: Effort normal and breath sounds normal. No respiratory distress. He has no wheezes.  Abdominal: Soft. Bowel sounds are normal. He exhibits no distension and no mass. There is no tenderness.  Genitourinary: Penis normal.  Genitourinary Comments: Prostate within normal limits  Musculoskeletal: Normal range of motion. He exhibits no edema.  Lymphadenopathy:    He has no cervical adenopathy.  Neurological: He is alert. He exhibits normal muscle tone.  Skin: Skin is warm and dry. No erythema.  Psychiatric: He has a normal mood and  affect. His behavior is normal. Judgment normal.          Assessment & Plan:  Impression 1 well daughter exam anticipatory guidance given. Up-to-date on colonoscopy. Due next year. #2 chronic back pain. With need for when necessary narcotics discussed will refill #3 chronic prostate hypertrophy. Blood work reviewed. Shingles vaccine encourage WSL

## 2016-11-19 ENCOUNTER — Other Ambulatory Visit: Payer: Self-pay | Admitting: Family Medicine

## 2016-11-22 ENCOUNTER — Telehealth: Payer: Self-pay | Admitting: Family Medicine

## 2016-11-22 MED ORDER — SULFACETAMIDE SODIUM 10 % OP SOLN: OPHTHALMIC | 0 refills | Status: DC

## 2016-11-22 NOTE — Telephone Encounter (Signed)
Spoke with patient and informed him that eye drops were called into pharmacy. Patient verbalized understanding.

## 2016-11-22 NOTE — Telephone Encounter (Signed)
Patient has pink eye with crusting around it. Would like drops called in.  [employer]

## 2017-02-11 ENCOUNTER — Telehealth: Payer: Self-pay | Admitting: Family Medicine

## 2017-02-11 NOTE — Telephone Encounter (Signed)
Pt is needing a letter of medical necessity in order for him to use his fsa to pay for quell pain relief. Please advise.

## 2017-02-14 ENCOUNTER — Other Ambulatory Visit: Payer: Self-pay | Admitting: Family Medicine

## 2017-02-14 NOTE — Telephone Encounter (Signed)
Last seen September 2017

## 2017-02-17 ENCOUNTER — Encounter: Payer: Self-pay | Admitting: Family Medicine

## 2017-02-17 NOTE — Telephone Encounter (Signed)
Dr. Richardson Landry,  We are unable to pull letter off of EPIC.  Did you hand write the letter?  Patient calling to check on it.

## 2017-02-17 NOTE — Telephone Encounter (Signed)
Patient notified

## 2017-02-17 NOTE — Telephone Encounter (Signed)
done

## 2017-05-16 ENCOUNTER — Telehealth: Payer: Self-pay | Admitting: Family Medicine

## 2017-05-16 DIAGNOSIS — Z125 Encounter for screening for malignant neoplasm of prostate: Secondary | ICD-10-CM

## 2017-05-16 DIAGNOSIS — Z131 Encounter for screening for diabetes mellitus: Secondary | ICD-10-CM

## 2017-05-16 DIAGNOSIS — Z13220 Encounter for screening for lipoid disorders: Secondary | ICD-10-CM

## 2017-05-16 NOTE — Telephone Encounter (Signed)
Lipid, liver, met 7 and psa- 05/2016

## 2017-05-16 NOTE — Telephone Encounter (Signed)
Has physical scheduled for 05-30-17 and would like lab order put in for [facility]. thx °

## 2017-05-16 NOTE — Telephone Encounter (Signed)
Blood work ordered in EPIC. Patient notified. 

## 2017-05-16 NOTE — Telephone Encounter (Signed)
same

## 2017-05-23 ENCOUNTER — Encounter: Payer: 59 | Admitting: Family Medicine

## 2017-05-23 DIAGNOSIS — Z131 Encounter for screening for diabetes mellitus: Secondary | ICD-10-CM | POA: Diagnosis not present

## 2017-05-23 DIAGNOSIS — Z13220 Encounter for screening for lipoid disorders: Secondary | ICD-10-CM | POA: Diagnosis not present

## 2017-05-23 DIAGNOSIS — Z125 Encounter for screening for malignant neoplasm of prostate: Secondary | ICD-10-CM | POA: Diagnosis not present

## 2017-05-24 LAB — HEPATIC FUNCTION PANEL
ALT: 17 IU/L (ref 0–44)
AST: 17 IU/L (ref 0–40)
Albumin: 4.2 g/dL (ref 3.6–4.8)
Alkaline Phosphatase: 66 IU/L (ref 39–117)
Bilirubin Total: 0.4 mg/dL (ref 0.0–1.2)
Bilirubin, Direct: 0.1 mg/dL (ref 0.00–0.40)
Total Protein: 7.6 g/dL (ref 6.0–8.5)

## 2017-05-24 LAB — BASIC METABOLIC PANEL
BUN/Creatinine Ratio: 16 (ref 10–24)
BUN: 13 mg/dL (ref 8–27)
CO2: 25 mmol/L (ref 20–29)
Calcium: 10 mg/dL (ref 8.6–10.2)
Chloride: 105 mmol/L (ref 96–106)
Creatinine, Ser: 0.81 mg/dL (ref 0.76–1.27)
GFR calc Af Amer: 111 mL/min/{1.73_m2} (ref >59)
GFR calc non Af Amer: 96 mL/min/{1.73_m2} (ref >59)
Glucose: 100 mg/dL — ABNORMAL HIGH (ref 65–99)
Potassium: 4.4 mmol/L (ref 3.5–5.2)
Sodium: 143 mmol/L (ref 134–144)

## 2017-05-24 LAB — LIPID PANEL
Chol/HDL Ratio: 3.4 ratio (ref 0.0–5.0)
Cholesterol, Total: 200 mg/dL — ABNORMAL HIGH (ref 100–199)
HDL: 59 mg/dL (ref >39)
LDL Calculated: 129 mg/dL — ABNORMAL HIGH (ref 0–99)
Triglycerides: 59 mg/dL (ref 0–149)
VLDL Cholesterol Cal: 12 mg/dL (ref 5–40)

## 2017-05-24 LAB — PSA: Prostate Specific Ag, Serum: 1.9 ng/mL (ref 0.0–4.0)

## 2017-05-30 ENCOUNTER — Ambulatory Visit (INDEPENDENT_AMBULATORY_CARE_PROVIDER_SITE_OTHER): Payer: 59 | Admitting: Family Medicine

## 2017-05-30 ENCOUNTER — Encounter: Payer: Self-pay | Admitting: Family Medicine

## 2017-05-30 VITALS — BP 118/80 | Ht 71.0 in | Wt 213.2 lb

## 2017-05-30 DIAGNOSIS — Z0000 Encounter for general adult medical examination without abnormal findings: Secondary | ICD-10-CM

## 2017-05-30 DIAGNOSIS — N400 Enlarged prostate without lower urinary tract symptoms: Secondary | ICD-10-CM

## 2017-05-30 DIAGNOSIS — M5441 Lumbago with sciatica, right side: Secondary | ICD-10-CM

## 2017-05-30 DIAGNOSIS — G8929 Other chronic pain: Secondary | ICD-10-CM

## 2017-05-30 MED ORDER — HYDROCODONE-ACETAMINOPHEN 10-325 MG PO TABS: 1.0000 | ORAL_TABLET | Freq: Four times a day (QID) | ORAL | 0 refills | Status: DC | PRN

## 2017-05-30 MED ORDER — CYCLOBENZAPRINE HCL 10 MG PO TABS: ORAL_TABLET | ORAL | 11 refills | Status: DC

## 2017-05-30 MED ORDER — TAMSULOSIN HCL 0.4 MG PO CAPS: 0.4000 mg | ORAL_CAPSULE | Freq: Every day | ORAL | 11 refills | Status: DC

## 2017-05-30 MED ORDER — NABUMETONE 500 MG PO TABS: ORAL_TABLET | ORAL | 11 refills | Status: DC

## 2017-05-30 NOTE — Progress Notes (Deleted)
   Subjective:    Patient ID: Ricardo Pacheco, male    DOB: 1955/10/13, 61 y.o.   MRN: [id number]  HPI    Review of Systems     Objective:   Physical Exam        Assessment & Plan:

## 2017-05-30 NOTE — Progress Notes (Signed)
Subjective:    Patient ID: Ricardo Pacheco, male    DOB: Jul 20, 1956, 61 y.o.   MRN: [id number]  HPI The patient comes in today for a wellness visit.    A review of their health history was completed.  A review of medications was also completed.  Any needed refills; yes   Eating habits:  Patient states does not eat much. Tries to eat healthy.   Falls/  MVA accidents in past few months: None   Regular exercise:  Patient states does not exercise outside of work.   Specialist pt sees on regular basis: none  Preventative health issues were discussed.   Additional concerns: Patient has concerns of stiffness to right hand.  Last colonoscopy age 48   Results for orders placed or performed in visit on 05/16/17  Lipid panel  Result Value Ref Range   Cholesterol, Total 200 (H) 100 - 199 mg/dL   Triglycerides 59 0 - 149 mg/dL   HDL 59 >39 mg/dL   VLDL Cholesterol Cal 12 5 - 40 mg/dL   LDL Calculated 129 (H) 0 - 99 mg/dL   Chol/HDL Ratio 3.4 0.0 - 5.0 ratio  Basic metabolic panel  Result Value Ref Range   Glucose 100 (H) 65 - 99 mg/dL   BUN 13 8 - 27 mg/dL   Creatinine, Ser 0.81 0.76 - 1.27 mg/dL   GFR calc non Af Amer 96 >59 mL/min/1.73   GFR calc Af Amer 111 >59 mL/min/1.73   BUN/Creatinine Ratio 16 10 - 24   Sodium 143 134 - 144 mmol/L   Potassium 4.4 3.5 - 5.2 mmol/L   Chloride 105 96 - 106 mmol/L   CO2 25 20 - 29 mmol/L   Calcium 10.0 8.6 - 10.2 mg/dL  Hepatic function panel  Result Value Ref Range   Total Protein 7.6 6.0 - 8.5 g/dL   Albumin 4.2 3.6 - 4.8 g/dL   Bilirubin Total 0.4 0.0 - 1.2 mg/dL   Bilirubin, Direct 0.10 0.00 - 0.40 mg/dL   Alkaline Phosphatase 66 39 - 117 IU/L   AST 17 0 - 40 IU/L   ALT 17 0 - 44 IU/L  PSA  Result Value Ref Range   Prostate Specific Ag, Serum 1.9 0.0 - 4.0 ng/mL   Flu shot given at work   Exercise overall good, stays on feet at work nd a lot of walking all day long, not going to gyn these days   Impression: Chronic pain.  Patient compliant with medication. No substantial side effects. [place] controlled substance registry reviewed to ensure compliance and proper use of medication. Patient aware goal of medicine is not complete resolution of pain but to control his symptoms to improve his functional capacity. Aware of potential adverse side effects   Patient has known history of prostate hypertrophy. Uses Flomax daily at bedtime for it. States definitely helps. Will maintain.  Of note uses pain medicine this intermittently during flares Review of Systems  Constitutional: Negative for activity change, appetite change and fever.  HENT: Negative for congestion and rhinorrhea.   Eyes: Negative for discharge.  Respiratory: Negative for cough and wheezing.   Cardiovascular: Negative for chest pain.  Gastrointestinal: Negative for abdominal pain, blood in stool and vomiting.  Genitourinary: Negative for difficulty urinating and frequency.  Musculoskeletal: Negative for neck pain.  Skin: Negative for rash.  Allergic/Immunologic: Negative for environmental allergies and food allergies.  Neurological: Negative for weakness and headaches.  Psychiatric/Behavioral: Negative for agitation.  All other  systems reviewed and are negative.      Objective:   Physical Exam  Constitutional: He appears well-developed and well-nourished.  HENT:  Head: Normocephalic and atraumatic.  Right Ear: External ear normal.  Left Ear: External ear normal.  Nose: Nose normal.  Mouth/Throat: Oropharynx is clear and moist.  Eyes: Pupils are equal, round, and reactive to light. EOM are normal.  Neck: Normal range of motion. Neck supple. No thyromegaly present.  Cardiovascular: Normal rate, regular rhythm and normal heart sounds.   No murmur heard. Pulmonary/Chest: Effort normal and breath sounds normal. No respiratory distress. He has no wheezes.  Abdominal: Soft. Bowel sounds are normal. He exhibits no distension and no mass.  There is no tenderness.  Genitourinary: Penis normal.  Genitourinary Comments: Prostate mild diffuse enlargement consistency in contour within normal limits  Musculoskeletal: Normal range of motion. He exhibits no edema.  Low back some pain to percussion negative straight leg raise  Lymphadenopathy:    He has no cervical adenopathy.  Neurological: He is alert. He exhibits normal muscle tone.  Skin: Skin is warm and dry. No erythema.  Psychiatric: He has a normal mood and affect. His behavior is normal. Judgment normal.  Vitals reviewed.         Assessment & Plan:  Needs collnoscopy Impression 1 well adult exam. Diet discussed exercise discussed. Anticipatory guidance given. Blood work reviewed. #2 chronic back pain ongoing need for occasional hydrocodone. Discussed. Will prescribe medication. Appropriate for this patient. He uses only when necessary and not excessively. Aware of side effects 3 prostate hypertrophy will maintain current treatment plan medications refilled. Diet exercise discussed. We'll work on GI referral next week. Shingles vaccine and  Patient compliant with pain medication. Continues to experience the pain which led to initiation of analgesic intervention. No significant negative side effects. States definitely needs the pain medication to maintain current level of functioning. Does not receive controlled substance pain medication elsewhere.

## 2017-05-30 NOTE — Progress Notes (Deleted)
   Subjective:    Patient ID: Ricardo Pacheco, male    DOB: 1956-02-03, 61 y.o.   MRN: [id number]  HPI    Review of Systems     Objective:   Physical Exam        Assessment & Plan:

## 2017-05-30 NOTE — Patient Instructions (Signed)
Du putyrens's contracture of the hand (trigger finger) Trigger Finger Trigger finger (stenosing tenosynovitis) is a condition that causes a finger to get stuck in a bent position. Each finger has a tough, cord-like tissue that connects muscle to bone (tendon), and each tendon is surrounded by a tunnel of tissue (tendon sheath). To move your finger, your tendon needs to slide freely through the sheath. Trigger finger happens when the tendon or the sheath thickens, making it difficult to move your finger. Trigger finger can affect any finger or a thumb. It may affect more than one finger. Mild cases may clear up with rest and medicine. Severe cases require more treatment. What are the causes? Trigger finger is caused by a thickened finger tendon or tendon sheath. The cause of this thickening is not known. What increases the risk? The following factors may make you more likely to develop this condition:  Doing activities that require a strong grip.  Having rheumatoid arthritis, gout, or diabetes.  Being 25-54 years old.  Being a woman.  What are the signs or symptoms? Symptoms of this condition include:  Pain when bending or straightening your finger.  Tenderness or swelling where your finger attaches to the palm of your hand.  A lump in the palm of your hand or on the inside of your finger.  Hearing a popping sound when you try to straighten your finger.  Feeling a popping, catching, or locking sensation when you try to straighten your finger.  Being unable to straighten your finger.  How is this diagnosed? This condition is diagnosed based on your symptoms and a physical exam. How is this treated? This condition may be treated by:  Resting your finger and avoiding activities that make symptoms worse.  Wearing a finger splint to keep your finger in a slightly bent position.  Taking NSAIDs to relieve pain and swelling.  Injecting medicine (steroids) into the tendon sheath to  reduce swelling and irritation. Injections may need to be repeated.  Having surgery to open the tendon sheath. This may be done if other treatments do not work and you cannot straighten your finger. You may need physical therapy after surgery.  Follow these instructions at home:  Use moist heat to help reduce pain and swelling as told by your health care provider.  Rest your finger and avoid activities that make pain worse. Return to normal activities as told by your health care provider.  If you have a splint, wear it as told by your health care provider.  Take over-the-counter and prescription medicines only as told by your health care provider.  Keep all follow-up visits as told by your health care provider. This is important. Contact a health care provider if:  Your symptoms are not improving with home care. Summary  Trigger finger (stenosing tenosynovitis) causes your finger to get stuck in a bent position, and it can make it difficult and painful to straighten your finger.  This condition develops when a finger tendon or tendon sheath thickens.  Treatment starts with resting, wearing a splint, and taking NSAIDs.  In severe cases, surgery to open the tendon sheath may be needed. This information is not intended to replace advice given to you by your health care provider. Make sure you discuss any questions you have with your health care provider. Document Released: 06/22/2004 Document Revised: 08/13/2016 Document Reviewed: 08/13/2016 Elsevier Interactive Patient Education  2017 [employer].

## 2017-06-02 ENCOUNTER — Encounter: Payer: Self-pay | Admitting: Family Medicine

## 2017-06-05 ENCOUNTER — Telehealth: Payer: Self-pay

## 2017-06-05 NOTE — Telephone Encounter (Signed)
Pt called to set up his colonoscopy. He isn't on blood thinners, no heart attacks or GI problems. He is off every Friday except Oct 5th. Please call [phone]

## 2017-06-09 ENCOUNTER — Telehealth: Payer: Self-pay

## 2017-06-09 NOTE — Telephone Encounter (Signed)
[phone] PATIENT RECEIVED LETTER TO SCHEDULE TCS  NO CURRENT GI PROBLEMS

## 2017-06-11 ENCOUNTER — Telehealth: Payer: Self-pay

## 2017-06-11 NOTE — Telephone Encounter (Signed)
LMOM to call.

## 2017-06-11 NOTE — Telephone Encounter (Signed)
See previous note

## 2017-06-12 NOTE — Telephone Encounter (Signed)
Gastroenterology Pre-Procedure Review  Request Date: 06/11/2017 Requesting Physician: Dr. Baltazar Apo  PATIENT REVIEW QUESTIONS: The patient responded to the following health history questions as indicated:    Last colonoscopy was 07/13/2007 by Dr. Oneida Alar  1. Diabetes Melitis: no 2. Joint replacements in the past 12 months: no 3. Major health problems in the past 3 months: no 4. Has an artificial valve or MVP: no 5. Has a defibrillator: no 6. Has been advised in past to take antibiotics in advance of a procedure like teeth cleaning: no 7. Family history of colon cancer: no  8. Alcohol Use: no 9. History of sleep apnea: no  10. History of coronary artery or other vascular stents placed within the last 12 months: no 11. History of any prior anesthesia complications: no    MEDICATIONS & ALLERGIES:    Patient reports the following regarding taking any blood thinners:   Plavix? no Aspirin? no Coumadin? no Brilinta? no Xarelto? no Eliquis? no Pradaxa? no Savaysa? no Effient? no  Patient confirms/reports the following medications:  Current Outpatient Prescriptions  Medication Sig Dispense Refill  . chlorzoxazone (PARAFON) 500 MG tablet Take by mouth 4 (four) times daily as needed for muscle spasms. Pt said he has it on hand, but rarely takes it    . cyclobenzaprine (FLEXERIL) 10 MG tablet TAKE (1) TABLET BY MOUTH EVERY EIGHT HOURS. (Patient taking differently: Pt takes one tablet every 2-3 days) 42 tablet 11  . HYDROcodone-acetaminophen (NORCO) 10-325 MG tablet Take 1 tablet by mouth every 6 (six) hours as needed for moderate pain. (Patient taking differently: Take 1 tablet by mouth every 6 (six) hours as needed for moderate pain. Pt just had a physical. He keeps these and just takes one tablet every 3 or 4 weeks, according to his work load. PCP lets him have some to keep on hand.) 60 tablet 0  . Multiple Vitamins-Minerals (MULTIVITAMIN PO) Take by mouth.    . nabumetone (RELAFEN)  500 MG tablet TAKE ONE TABLET TWICE A DAY WITH FOOD AS NEEDED. 42 tablet 11  . tamsulosin (FLOMAX) 0.4 MG CAPS capsule Take 1 capsule (0.4 mg total) by mouth daily. 30 capsule 11   No current facility-administered medications for this visit.     Patient confirms/reports the following allergies:  No Known Allergies  No orders of the defined types were placed in this encounter.   AUTHORIZATION INFORMATION Primary Insurance:   ID #:  Group #:  Pre-Cert / Auth required:  Pre-Cert / Auth #:   Secondary Insurance:   ID #:   Group #:  Pre-Cert / Auth required:  Pre-Cert / Auth #:   SCHEDULE INFORMATION: Procedure has been scheduled as follows:  Date:    07/18/2017                   Time: 2:15 PM Location: [facility] Short Stay  This Gastroenterology Pre-Precedure Review Form is being routed to the following provider(s): Barney Drain, MD

## 2017-06-12 NOTE — Telephone Encounter (Signed)
See triage

## 2017-06-15 NOTE — Telephone Encounter (Signed)
Ok to schedule.

## 2017-06-16 ENCOUNTER — Other Ambulatory Visit: Payer: Self-pay

## 2017-06-16 DIAGNOSIS — Z1211 Encounter for screening for malignant neoplasm of colon: Secondary | ICD-10-CM

## 2017-06-16 MED ORDER — NA SULFATE-K SULFATE-MG SULF 17.5-3.13-1.6 GM/177ML PO SOLN: 1.0000 | ORAL | 0 refills | Status: DC

## 2017-06-16 NOTE — Telephone Encounter (Signed)
Rx sent to the pharmacy and instructions mailed to pt.  

## 2017-06-27 ENCOUNTER — Telehealth: Payer: Self-pay | Admitting: *Deleted

## 2017-06-27 NOTE — Telephone Encounter (Signed)
Fax from [employer]. Notice of approval for hydrocodone 10/325 mg from 06/26/17 - 06/26/2018. [place] apoth notified. Letter of approval scanned into pt's chart.

## 2017-07-15 ENCOUNTER — Telehealth: Payer: Self-pay

## 2017-07-15 NOTE — Telephone Encounter (Signed)
PA # [id number] for the screening colonoscopy.

## 2017-07-15 NOTE — Telephone Encounter (Signed)
Tried to call pt, LMOAM/LMOVM for him to call office. Had a cancellation for 07/18/17 and want to ask pt if he can arrive earlier to the hospital for his TCS.

## 2017-07-16 NOTE — Telephone Encounter (Signed)
Open slot was filled (pt can disregard my call).

## 2017-07-18 ENCOUNTER — Encounter (HOSPITAL_COMMUNITY)
Admission: RE | Disposition: A | Discharge status: Home Health | Payer: Self-pay | Source: Ambulatory Visit | Attending: Gastroenterology

## 2017-07-18 ENCOUNTER — Ambulatory Visit (HOSPITAL_COMMUNITY)
Admission: RE | Admit: 2017-07-18 | Discharge: 2017-07-18 | Disposition: A | Discharge status: Home Health | Payer: 59 | Source: Ambulatory Visit | Attending: Gastroenterology | Admitting: Gastroenterology

## 2017-07-18 ENCOUNTER — Encounter (HOSPITAL_COMMUNITY): Payer: Self-pay | Admitting: *Deleted

## 2017-07-18 DIAGNOSIS — Z823 Family history of stroke: Secondary | ICD-10-CM | POA: Diagnosis not present

## 2017-07-18 DIAGNOSIS — Z8042 Family history of malignant neoplasm of prostate: Secondary | ICD-10-CM | POA: Insufficient documentation

## 2017-07-18 DIAGNOSIS — D123 Benign neoplasm of transverse colon: Secondary | ICD-10-CM | POA: Insufficient documentation

## 2017-07-18 DIAGNOSIS — K644 Residual hemorrhoidal skin tags: Secondary | ICD-10-CM | POA: Insufficient documentation

## 2017-07-18 DIAGNOSIS — Z1212 Encounter for screening for malignant neoplasm of rectum: Secondary | ICD-10-CM | POA: Diagnosis not present

## 2017-07-18 DIAGNOSIS — K635 Polyp of colon: Secondary | ICD-10-CM

## 2017-07-18 DIAGNOSIS — K5730 Diverticulosis of large intestine without perforation or abscess without bleeding: Secondary | ICD-10-CM | POA: Insufficient documentation

## 2017-07-18 DIAGNOSIS — Z8249 Family history of ischemic heart disease and other diseases of the circulatory system: Secondary | ICD-10-CM | POA: Diagnosis not present

## 2017-07-18 DIAGNOSIS — Z800 Family history of malignant neoplasm of digestive organs: Secondary | ICD-10-CM | POA: Diagnosis not present

## 2017-07-18 DIAGNOSIS — Z79899 Other long term (current) drug therapy: Secondary | ICD-10-CM | POA: Diagnosis not present

## 2017-07-18 DIAGNOSIS — Z1211 Encounter for screening for malignant neoplasm of colon: Secondary | ICD-10-CM | POA: Diagnosis not present

## 2017-07-18 DIAGNOSIS — Z87891 Personal history of nicotine dependence: Secondary | ICD-10-CM | POA: Insufficient documentation

## 2017-07-18 DIAGNOSIS — K648 Other hemorrhoids: Secondary | ICD-10-CM | POA: Diagnosis not present

## 2017-07-18 HISTORY — PX: POLYPECTOMY: SHX5525

## 2017-07-18 HISTORY — PX: COLONOSCOPY: SHX5424

## 2017-07-18 SURGERY — COLONOSCOPY
Anesthesia: Moderate Sedation

## 2017-07-18 MED ORDER — MIDAZOLAM HCL 5 MG/5ML IJ SOLN
INTRAMUSCULAR | Status: DC | PRN
  Administered 2017-07-18 (×3): 2 mg via INTRAVENOUS

## 2017-07-18 MED ORDER — MEPERIDINE HCL 100 MG/ML IJ SOLN
INTRAMUSCULAR | Status: AC
  Filled 2017-07-18: qty 2

## 2017-07-18 MED ORDER — SODIUM CHLORIDE 0.9 % IV SOLN
INTRAVENOUS | Status: DC
  Administered 2017-07-18: 1000 mL via INTRAVENOUS

## 2017-07-18 MED ORDER — MEPERIDINE HCL 100 MG/ML IJ SOLN
INTRAMUSCULAR | Status: DC | PRN
  Administered 2017-07-18 (×3): 25 mg via INTRAVENOUS

## 2017-07-18 MED ORDER — MIDAZOLAM HCL 5 MG/5ML IJ SOLN
INTRAMUSCULAR | Status: AC
  Filled 2017-07-18: qty 10

## 2017-07-18 NOTE — Op Note (Signed)
[facility] Patient Name: Ricardo Pacheco Procedure Date: 07/18/2017 2:14 PM MRN: [id number] Date of Birth: September 08, 1956 Attending MD: Barney Drain MD, MD CSN: [id number] Age: 61 Admit Type: Outpatient Procedure:                Colonoscopy WITH EMR/ELEVIEW Indications:              Screening for colorectal malignant neoplasm Providers:                Barney Drain MD, MD, Lurline Del, RN, Charlsie Quest.                            Theda Sers RN, RN Referring MD:             Rosemary Holms, MD Medicines:                Meperidine 75 mg IV, Midazolam 6 mg IV Complications:            No immediate complications. Estimated Blood Loss:     Estimated blood loss was minimal. Procedure:                Pre-Anesthesia Assessment:                           - Prior to the procedure, a History and Physical                            was performed, and patient medications and                            allergies were reviewed. The patient's tolerance of                            previous anesthesia was also reviewed. The risks                            and benefits of the procedure and the sedation                            options and risks were discussed with the patient.                            All questions were answered, and informed consent                            was obtained. Prior Anticoagulants: The patient has                            taken previous NSAID medication, last dose was 4                            days prior to procedure. ASA Grade Assessment: II -                            A patient with mild systemic disease. After  reviewing the risks and benefits, the patient was                            deemed in satisfactory condition to undergo the                            procedure. After obtaining informed consent, the                            colonoscope was passed under direct vision.                            Throughout the procedure, the  patient's blood                            pressure, pulse, and oxygen saturations were                            monitored continuously. The EC-[id number] ([id number])                            scope was introduced through the anus and advanced                            to the the cecum, identified by appendiceal orifice                            and ileocecal valve. The ileocecal valve,                            appendiceal orifice, and rectum were photographed.                            The colonoscopy was technically difficult and                            complex due to significant looping. Successful                            completion of the procedure was aided by                            straightening and shortening the scope to obtain                            bowel loop reduction and COLOWRAP. The patient                            tolerated the procedure well. The quality of the                            bowel preparation was good. Scope In: 2:34:15 PM Scope Out: 3:08:13 PM Scope Withdrawal Time: 0 hours 31 minutes 23 seconds  Total Procedure Duration: 0 hours  33 minutes 58 seconds  Findings:      A 6 mm polyp was found in the hepatic flexure. The polyp was flat.       Preparations were made for mucosal resection. ELEVIEW was injected to       raise the lesion. Snare mucosal resection was performed. Resection was       complete, and retrieval was complete. Estimated blood loss was minimal.      Multiple small and large-mouthed diverticula were found in the       recto-sigmoid colon, sigmoid colon and descending colon.      The recto-sigmoid colon and sigmoid colon were moderately redundant.      External hemorrhoids were found during retroflexion. The hemorrhoids       were moderate. Impression:               - One 6 mm polyp at the hepatic flexure, removed                            with mucosal resection. Resected and retrieved VIA                            EMR.                            - Diverticulosis in the recto-sigmoid colon, in the                            sigmoid colon and in the descending colon.                           - Redundant LEFT colon.                           - Internal hemorrhoids. Moderate Sedation:      Moderate (conscious) sedation was administered by the endoscopy nurse       and supervised by the endoscopist. The following parameters were       monitored: oxygen saturation, heart rate, blood pressure, and response       to care. Total physician intraservice time was 49 minutes. Recommendation:           - Repeat colonoscopy in 5-10 years for surveillance.                           - High fiber diet.                           - Continue present medications.                           - Await pathology results.                           - Patient has a contact number available for                            emergencies. The signs and symptoms of potential  delayed complications were discussed with the                            patient. Return to normal activities tomorrow.                            Written discharge instructions were provided to the                            patient. Procedure Code(s):        --- Professional ---                           [phone], Colonoscopy, flexible; with endoscopic                            mucosal resection                           [phone], Moderate sedation services provided by the                            same physician or other qualified health care                            professional performing the diagnostic or                            therapeutic service that the sedation supports,                            requiring the presence of an independent trained                            observer to assist in the monitoring of the                            patient's level of consciousness and physiological                            status; initial  15 minutes of intraservice time,                            patient age 48 years or older                           [phone], Moderate sedation services; each additional                            15 minutes intraservice time                           99153, Moderate sedation services; each additional                            15 minutes intraservice  time Diagnosis Code(s):        --- Professional ---                           Z12.11, Encounter for screening for malignant                            neoplasm of colon                           D12.3, Benign neoplasm of transverse colon (hepatic                            flexure or splenic flexure)                           K64.8, Other hemorrhoids                           K57.30, Diverticulosis of large intestine without                            perforation or abscess without bleeding                           Q43.8, Other specified congenital malformations of                            intestine CPT copyright 2016 American Medical Association. All rights reserved. The codes documented in this report are preliminary and upon coder review may  be revised to meet current compliance requirements. Barney Drain, MD Barney Drain MD, MD 07/18/2017 3:24:38 PM This report has been signed electronically. Number of Addenda: 0

## 2017-07-18 NOTE — H&P (Signed)
Primary Care Physician:  Mikey Kirschner, MD Primary Gastroenterologist:  Dr. Oneida Alar  Pre-Procedure History & Physical: HPI:  Ricardo Pacheco is a 61 y.o. male here for COLON CANCER SCREENING.  Past Medical History:  Diagnosis Date  . Decreased white blood cell count     Past Surgical History:  Procedure Laterality Date  . COLONOSCOPY      Prior to Admission medications   Medication Sig Start Date End Date Taking? Authorizing Provider  chlorzoxazone (PARAFON) 500 MG tablet Take 500 mg by mouth 4 (four) times daily as needed for muscle spasms. Pt said he has it on hand, but rarely takes it    Yes [provider]  cyclobenzaprine (FLEXERIL) 10 MG tablet TAKE (1) TABLET BY MOUTH EVERY EIGHT HOURS. Patient taking differently: Take 10 mg by mouth 3 (three) times daily as needed for muscle spasms (back stiffness/spasms).  05/30/17  Yes Mikey Kirschner, MD  HYDROcodone-acetaminophen (NORCO) 10-325 MG tablet Take 1 tablet by mouth every 6 (six) hours as needed for moderate pain. 05/30/17  Yes Mikey Kirschner, MD  Multiple Vitamin (MULTIVITAMIN WITH MINERALS) TABS tablet Take 1 tablet by mouth daily.   Yes [provider]  Na Sulfate-K Sulfate-Mg Sulf (SUPREP BOWEL PREP KIT) 17.5-3.13-1.6 GM/180ML SOLN Take 1 kit by mouth as directed. 06/16/17  Yes Fields, Sandi L, MD  nabumetone (RELAFEN) 500 MG tablet TAKE ONE TABLET TWICE A DAY WITH FOOD AS NEEDED. Patient taking differently: Take 500 mg by mouth 2 (two) times daily as needed (for inflammation.). TAKE ONE TABLET TWICE A DAY WITH FOOD AS NEEDED. 05/30/17  Yes Mikey Kirschner, MD  tamsulosin (FLOMAX) 0.4 MG CAPS capsule Take 1 capsule (0.4 mg total) by mouth daily. Patient taking differently: Take 0.4 mg by mouth at bedtime.  05/30/17  Yes Mikey Kirschner, MD    Allergies as of 06/16/2017  . (No Known Allergies)    Family History  Problem Relation Age of Onset  . Hypertension Mother   . Stroke Father   . Prostate  cancer Brother   . Throat cancer Brother   . Prostate cancer Brother     Social History   Social History  . Marital status: Married    Spouse name: N/A  . Number of children: N/A  . Years of education: N/A   Occupational History  . Not on file.   Social History Main Topics  . Smoking status: Former Research scientist (life sciences)  . Smokeless tobacco: Never Used  . Alcohol use No  . Drug use: No  . Sexual activity: Not on file   Other Topics Concern  . Not on file   Social History Narrative  . No narrative on file    Review of Systems: See HPI, otherwise negative ROS   Physical Exam: BP 122/87   Pulse 82   Temp 98.5 F (36.9 C) (Oral)   Resp 15   Ht 6' 1" (1.854 m)   Wt 216 lb (98 kg)   SpO2 98%   BMI 28.50 kg/m  General:   Alert,  pleasant and cooperative in NAD Head:  Normocephalic and atraumatic. Neck:  Supple; Lungs:  Clear throughout to auscultation.    Heart:  Regular rate and rhythm. Abdomen:  Soft, nontender and nondistended. Normal bowel sounds, without guarding, and without rebound.   Neurologic:  Alert and  oriented x4;  grossly normal neurologically.  Impression/Plan:     SCREENING  Plan:  1. TCS TODAY DISCUSSED PROCEDURE, BENEFITS, & RISKS: <  1% chance of medication reaction, bleeding, perforation, or rupture of spleen/liver.

## 2017-07-18 NOTE — Discharge Instructions (Signed)
YOU HAD ONE POLYP REMOVED.  You have small internal hemorrhoids and diverticulosis in your left colon.   DRINK WATER TO KEEP YOUR URINE LIGHT YELLOW.  FOLLOW A HIGH FIBER DIET. AVOID ITEMS THAT CAUSE BLOATING & GAS. See info below.  Next colonoscopy in  5-10 years(2023-2025). Colonoscopy Care After Read the instructions outlined below and refer to this sheet in the next week. These discharge instructions provide you with general information on caring for yourself after you leave the hospital. While your treatment has been planned according to the most current medical practices available, unavoidable complications occasionally occur. If you have any problems or questions after discharge, call DR. Mikaelyn Arthurs, [phone].  ACTIVITY  You may resume your regular activity, but move at a slower pace for the next 24 hours.   Take frequent rest periods for the next 24 hours.   Walking will help get rid of the air and reduce the bloated feeling in your belly (abdomen).   No driving for 24 hours (because of the medicine (anesthesia) used during the test).   You may shower.   Do not sign any important legal documents or operate any machinery for 24 hours (because of the anesthesia used during the test).    NUTRITION  Drink plenty of fluids.   You may resume your normal diet as instructed by your doctor.   Begin with a light meal and progress to your normal diet. Heavy or fried foods are harder to digest and may make you feel sick to your stomach (nauseated).   Avoid alcoholic beverages for 24 hours or as instructed.    MEDICATIONS  You may resume your normal medications.   WHAT YOU CAN EXPECT TODAY  Some feelings of bloating in the abdomen.   Passage of more gas than usual.   Spotting of blood in your stool or on the toilet paper  .  IF YOU HAD POLYPS REMOVED DURING THE COLONOSCOPY:  Eat a soft diet IF YOU HAVE NAUSEA, BLOATING, ABDOMINAL PAIN, OR VOMITING.    FINDING OUT  THE RESULTS OF YOUR TEST Not all test results are available during your visit. DR. Oneida Alar WILL CALL YOU WITHIN 14 DAYS OF YOUR PROCEDUE WITH YOUR RESULTS. Do not assume everything is normal if you have not heard from DR. Emiley Digiacomo, CALL HER OFFICE AT [phone].  SEEK IMMEDIATE MEDICAL ATTENTION AND CALL THE OFFICE: [phone] IF:  You have more than a spotting of blood in your stool.   Your belly is swollen (abdominal distention).   You are nauseated or vomiting.   You have a temperature over 101F.   You have abdominal pain or discomfort that is severe or gets worse throughout the day.  High-Fiber Diet A high-fiber diet changes your normal diet to include more whole grains, legumes, fruits, and vegetables. Changes in the diet involve replacing refined carbohydrates with unrefined foods. The calorie level of the diet is essentially unchanged. The Dietary Reference Intake (recommended amount) for adult males is 38 grams per day. For adult females, it is 25 grams per day. Pregnant and lactating women should consume 28 grams of fiber per day. Fiber is the intact part of a plant that is not broken down during digestion. Functional fiber is fiber that has been isolated from the plant to provide a beneficial effect in the body. PURPOSE  Increase stool bulk.   Ease and regulate bowel movements.   Lower cholesterol.   REDUCE RISK OF COLON CANCER  INDICATIONS THAT YOU NEED MORE FIBER  Constipation and hemorrhoids.   Uncomplicated diverticulosis (intestine condition) and irritable bowel syndrome.   Weight management.   As a protective measure against hardening of the arteries (atherosclerosis), diabetes, and cancer.   GUIDELINES FOR INCREASING FIBER IN THE DIET  Start adding fiber to the diet slowly. A gradual increase of about 5 more grams (2 slices of whole-wheat bread, 2 servings of most fruits or vegetables, or 1 bowl of high-fiber cereal) per day is best. Too rapid an increase in  fiber may result in constipation, flatulence, and bloating.   Drink enough water and fluids to keep your urine clear or pale yellow. Water, juice, or caffeine-free drinks are recommended. Not drinking enough fluid may cause constipation.   Eat a variety of high-fiber foods rather than one type of fiber.   Try to increase your intake of fiber through using high-fiber foods rather than fiber pills or supplements that contain small amounts of fiber.   The goal is to change the types of food eaten. Do not supplement your present diet with high-fiber foods, but replace foods in your present diet.   INCLUDE A VARIETY OF FIBER SOURCES  Replace refined and processed grains with whole grains, canned fruits with fresh fruits, and incorporate other fiber sources. White rice, white breads, and most bakery goods contain little or no fiber.   Brown whole-grain rice, buckwheat oats, and many fruits and vegetables are all good sources of fiber. These include: broccoli, Brussels sprouts, cabbage, cauliflower, beets, sweet potatoes, white potatoes (skin on), carrots, tomatoes, eggplant, squash, berries, fresh fruits, and dried fruits.   Cereals appear to be the richest source of fiber. Cereal fiber is found in whole grains and bran. Bran is the fiber-rich outer coat of cereal grain, which is largely removed in refining. In whole-grain cereals, the bran remains. In breakfast cereals, the largest amount of fiber is found in those with "bran" in their names. The fiber content is sometimes indicated on the label.   You may need to include additional fruits and vegetables each day.   In baking, for 1 cup white flour, you may use the following substitutions:   1 cup whole-wheat flour minus 2 tablespoons.   1/2 cup white flour plus 1/2 cup whole-wheat flour.   Polyps, Colon  A polyp is extra tissue that grows inside your body. Colon polyps grow in the large intestine. The large intestine, also called the colon, is  part of your digestive system. It is a long, hollow tube at the end of your digestive tract where your body makes and stores stool. Most polyps are not dangerous. They are benign. This means they are not cancerous. But over time, some types of polyps can turn into cancer. Polyps that are smaller than a pea are usually not harmful. But larger polyps could someday become or may already be cancerous. To be safe, doctors remove all polyps and test them.   WHO GETS POLYPS? Anyone can get polyps, but certain people are more likely than others. You may have a greater chance of getting polyps if:  You are over 50.   You have had polyps before.   Someone in your family has had polyps.   Someone in your family has had cancer of the large intestine.   Find out if someone in your family has had polyps. You may also be more likely to get polyps if you:   Eat a lot of fatty foods   Smoke   Drink alcohol  Do not exercise  Eat too much    PREVENTION There is not one sure way to prevent polyps. You might be able to lower your risk of getting them if you:  Eat more fruits and vegetables and less fatty food.   Do not smoke.   Avoid alcohol.   Exercise every day.   Lose weight if you are overweight.   Eating more calcium and folate can also lower your risk of getting polyps. Some foods that are rich in calcium are milk, cheese, and broccoli. Some foods that are rich in folate are chickpeas, kidney beans, and spinach.     Diverticulosis Diverticulosis is a common condition that develops when small pouches (diverticula) form in the wall of the colon. The risk of diverticulosis increases with age. It happens more often in people who eat a low-fiber diet. Most individuals with diverticulosis have no symptoms. Those individuals with symptoms usually experience belly (abdominal) pain, constipation, or loose stools (diarrhea).  HOME CARE INSTRUCTIONS  Increase the amount of fiber in your diet as  directed by your caregiver or dietician. This may reduce symptoms of diverticulosis.   Drink at least 6 to 8 glasses of water each day to prevent constipation.   Try not to strain when you have a bowel movement.   Avoiding nuts and seeds to prevent complications is NOT NECESSARY.   FOODS HAVING HIGH FIBER CONTENT INCLUDE:  Fruits. Apple, peach, pear, tangerine, raisins, prunes.   Vegetables. Brussels sprouts, asparagus, broccoli, cabbage, carrot, cauliflower, romaine lettuce, spinach, summer squash, tomato, winter squash, zucchini.   Starchy Vegetables. Baked beans, kidney beans, lima beans, split peas, lentils, potatoes (with skin).   Grains. Whole wheat bread, brown rice, bran flake cereal, plain oatmeal, white rice, shredded wheat, bran muffins.    SEEK IMMEDIATE MEDICAL CARE IF:  You develop increasing pain or severe bloating.   You have an oral temperature above 101F.   You develop vomiting or bowel movements that are bloody or black.   Hemorrhoids Hemorrhoids are dilated (enlarged) veins around the rectum. Sometimes clots will form in the veins. This makes them swollen and painful. These are called thrombosed hemorrhoids. Causes of hemorrhoids include:  Constipation.   Straining to have a bowel movement.   HEAVY LIFTING  HOME CARE INSTRUCTIONS  Eat a well balanced diet and drink 6 to 8 glasses of water every day to avoid constipation. You may also use a bulk laxative.   Avoid straining to have bowel movements.   Keep anal area dry and clean.   Do not use a donut shaped pillow or sit on the toilet for long periods. This increases blood pooling and pain.   Move your bowels when your body has the urge; this will require less straining and will decrease pain and pressure.

## 2017-07-22 ENCOUNTER — Encounter (HOSPITAL_COMMUNITY): Payer: Self-pay | Admitting: Gastroenterology

## 2017-08-18 ENCOUNTER — Telehealth: Payer: Self-pay | Admitting: General Practice

## 2017-08-18 NOTE — Telephone Encounter (Signed)
I spoke with the patient at [phone] and made him aware that we coded his chart correctly.  He stated he will appeal this with his insurance company and thanked me for calling.

## 2017-08-20 ENCOUNTER — Telehealth: Payer: Self-pay | Admitting: Family Medicine

## 2017-08-20 DIAGNOSIS — M549 Dorsalgia, unspecified: Principal | ICD-10-CM

## 2017-08-20 DIAGNOSIS — G8929 Other chronic pain: Secondary | ICD-10-CM

## 2017-08-20 NOTE — Telephone Encounter (Signed)
Patient dropped off disability form to be filled out from department of [place]. After review forms this needs to be filled out by primary care doctor its very detailed and might need office visit to fill out.Forms are in your yellow folder for review.

## 2017-08-29 NOTE — Telephone Encounter (Signed)
Which specialist do we need to refer to? Also what reason do they need to be seen by the specialist Please advise.

## 2017-08-29 NOTE — Telephone Encounter (Signed)
Contact pt, the exam and evaluation and letter  that this form demands is beyond my trainang and skill as a family doc to do, they are asking me to document evaluations that I do not even know how to perform, will require specilist (at least one) likely one in physical med and rehab (like dr Brien Few)

## 2017-09-02 NOTE — Addendum Note (Signed)
Addended by: Dairl Ponder on: 09/02/2017 08:22 AM   Modules accepted: Orders

## 2017-09-02 NOTE — Telephone Encounter (Signed)
psee prev response try dr Brien Few physical med specialist,

## 2017-09-02 NOTE — Telephone Encounter (Signed)
Patient is aware and picked up the forms to take to specialist.

## 2017-09-02 NOTE — Telephone Encounter (Signed)
Referral ordered in Epic. 

## 2017-09-03 ENCOUNTER — Encounter: Payer: Self-pay | Admitting: Family Medicine

## 2017-09-05 ENCOUNTER — Telehealth: Payer: Self-pay | Admitting: Family Medicine

## 2017-09-05 DIAGNOSIS — M65341 Trigger finger, right ring finger: Secondary | ICD-10-CM | POA: Diagnosis not present

## 2017-09-05 DIAGNOSIS — M65331 Trigger finger, right middle finger: Secondary | ICD-10-CM | POA: Diagnosis not present

## 2017-09-05 NOTE — Telephone Encounter (Signed)
cakll pt, let him know t specialist insisting I do the mri work up first before being seen, will need a visit with me first before I order

## 2017-09-05 NOTE — Telephone Encounter (Signed)
Pt spoke with [facility] yesterday (referral was sent to them on 09/03/17)  They told pt we need to order a lumbar MRI for the patient & then send the results to them to review so that they can get him an appointment scheduled.  (I checked & pt's plan does not require prior auth) (at lest for this year)  Please advise

## 2017-09-05 NOTE — Telephone Encounter (Signed)
Patient notified and scheduled follow up office visit.

## 2017-09-10 ENCOUNTER — Encounter: Payer: Self-pay | Admitting: Family Medicine

## 2017-09-10 ENCOUNTER — Ambulatory Visit (INDEPENDENT_AMBULATORY_CARE_PROVIDER_SITE_OTHER): Payer: 59 | Admitting: Family Medicine

## 2017-09-10 VITALS — BP 130/84 | Ht 73.0 in | Wt 224.0 lb

## 2017-09-10 DIAGNOSIS — M549 Dorsalgia, unspecified: Secondary | ICD-10-CM

## 2017-09-10 DIAGNOSIS — M5431 Sciatica, right side: Secondary | ICD-10-CM

## 2017-09-10 DIAGNOSIS — G8929 Other chronic pain: Secondary | ICD-10-CM | POA: Diagnosis not present

## 2017-09-10 MED ORDER — PREDNISONE 20 MG PO TABS: ORAL_TABLET | ORAL | 0 refills | Status: DC

## 2017-09-10 NOTE — Progress Notes (Signed)
   Subjective:    Patient ID: Ricardo Pacheco, male    DOB: 12-12-1955, 61 y.o.   MRN: [id number]  HPIFollow up back pain. Specialist wants MRI done first before seeing pt. Have been having the pain for years. Pain in low back. Has tried chloroxazone, flexeril, hydrocodone, relafen.   Patient experiencing progressive back pain.  Radiates into the right leg.  Severe at times.  Affect ability to do daily job.  Pain bad in the morning pain bad at work.  Radiates all the way down to the right foot.  History of having seen a neurosurgeon in the past.  Never felt to be neurosurgical.  Patient frustrated feels he needs to definitely take this further. Notes intermittent sensation of right leg weakness and paresthesias to right foot.  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 times  Felt a lot of impact at that time but did not recall srious back injury   Pt had physical therapy and was felt to not have surg need at the last visit    Low back pain and severe  Then rad around the pelvis  Worse early in the morn   With any phys activites  Gets trouble with severe pain   Uses hydrocodone prn   Pain rad down into the right leg  pts job now requires a lot of shoveling, casuees pain to flare up             Review of Systems No headache, no major weight loss or weight gain, no chest pain no back pain abdominal pain no change in bowel habits complete ROS otherwise negative     Objective:   Physical Exam Alert and oriented, vitals reviewed and stable, NAD ENT-TM's and ext canals WNL bilat via otoscopic exam Soft palate, tonsils and post pharynx WNL via oropharyngeal exam Neck-symmetric, no masses; thyroid nonpalpable and nontender Pulmonary-no tachypnea or accessory muscle use; Clear  without wheezes via auscultation Card--no abnrml murmurs, rhythm reg and rate WNL Carotid pulses symmetric, without bruits Positive right leg straight leg raise.  Positive right sciatic notch tenderness.  Positive low back pain.  Right leg reflexes and strength currently intact       Assessment & Plan:  Impression progressive sciatica.  Becoming severe.  Impacting patient tremendously.  Trying to refer him back to neurosurgeon a demanded MRI first.  Patient does need one.  MRI ordered.  Prednisone taper.  Further recommendations based on results  Greater than 50% of this 25 minute face to face visit was spent in counseling and discussion and coordination of care regarding the above diagnosis/diagnosies

## 2017-09-15 ENCOUNTER — Ambulatory Visit (HOSPITAL_COMMUNITY)
Admission: RE | Admit: 2017-09-15 | Discharge: 2017-09-15 | Disposition: A | Discharge status: Home / Self Care | Payer: 59 | Source: Ambulatory Visit | Attending: Family Medicine | Admitting: Family Medicine

## 2017-09-15 DIAGNOSIS — M545 Low back pain: Secondary | ICD-10-CM | POA: Diagnosis not present

## 2017-09-15 DIAGNOSIS — M5431 Sciatica, right side: Secondary | ICD-10-CM | POA: Diagnosis not present

## 2017-09-15 DIAGNOSIS — M549 Dorsalgia, unspecified: Secondary | ICD-10-CM | POA: Diagnosis present

## 2017-09-15 DIAGNOSIS — G8929 Other chronic pain: Secondary | ICD-10-CM | POA: Insufficient documentation

## 2017-09-15 DIAGNOSIS — M5137 Other intervertebral disc degeneration, lumbosacral region: Secondary | ICD-10-CM | POA: Diagnosis not present

## 2017-09-15 DIAGNOSIS — M5136 Other intervertebral disc degeneration, lumbar region: Secondary | ICD-10-CM | POA: Insufficient documentation

## 2017-10-10 DIAGNOSIS — M65331 Trigger finger, right middle finger: Secondary | ICD-10-CM | POA: Diagnosis not present

## 2017-10-10 DIAGNOSIS — M65341 Trigger finger, right ring finger: Secondary | ICD-10-CM | POA: Insufficient documentation

## 2017-11-11 DIAGNOSIS — K045 Chronic apical periodontitis: Secondary | ICD-10-CM | POA: Diagnosis not present

## 2017-11-17 ENCOUNTER — Encounter: Payer: Self-pay | Admitting: Family Medicine

## 2017-12-05 DIAGNOSIS — M51369 Other intervertebral disc degeneration, lumbar region without mention of lumbar back pain or lower extremity pain: Secondary | ICD-10-CM | POA: Insufficient documentation

## 2017-12-05 DIAGNOSIS — M5136 Other intervertebral disc degeneration, lumbar region: Secondary | ICD-10-CM | POA: Diagnosis not present

## 2017-12-25 DIAGNOSIS — M5136 Other intervertebral disc degeneration, lumbar region: Secondary | ICD-10-CM | POA: Diagnosis not present

## 2018-01-16 DIAGNOSIS — M6281 Muscle weakness (generalized): Secondary | ICD-10-CM | POA: Diagnosis not present

## 2018-01-16 DIAGNOSIS — M2560 Stiffness of unspecified joint, not elsewhere classified: Secondary | ICD-10-CM | POA: Diagnosis not present

## 2018-01-16 DIAGNOSIS — M545 Low back pain: Secondary | ICD-10-CM | POA: Diagnosis not present

## 2018-01-20 DIAGNOSIS — M2560 Stiffness of unspecified joint, not elsewhere classified: Secondary | ICD-10-CM | POA: Diagnosis not present

## 2018-01-20 DIAGNOSIS — M6281 Muscle weakness (generalized): Secondary | ICD-10-CM | POA: Diagnosis not present

## 2018-01-20 DIAGNOSIS — M545 Low back pain: Secondary | ICD-10-CM | POA: Diagnosis not present

## 2018-01-22 DIAGNOSIS — M6281 Muscle weakness (generalized): Secondary | ICD-10-CM | POA: Diagnosis not present

## 2018-01-22 DIAGNOSIS — M545 Low back pain: Secondary | ICD-10-CM | POA: Diagnosis not present

## 2018-01-22 DIAGNOSIS — M2560 Stiffness of unspecified joint, not elsewhere classified: Secondary | ICD-10-CM | POA: Diagnosis not present

## 2018-01-27 DIAGNOSIS — M2560 Stiffness of unspecified joint, not elsewhere classified: Secondary | ICD-10-CM | POA: Diagnosis not present

## 2018-01-27 DIAGNOSIS — M6281 Muscle weakness (generalized): Secondary | ICD-10-CM | POA: Diagnosis not present

## 2018-01-27 DIAGNOSIS — M545 Low back pain: Secondary | ICD-10-CM | POA: Diagnosis not present

## 2018-01-29 DIAGNOSIS — M2560 Stiffness of unspecified joint, not elsewhere classified: Secondary | ICD-10-CM | POA: Diagnosis not present

## 2018-01-29 DIAGNOSIS — M6281 Muscle weakness (generalized): Secondary | ICD-10-CM | POA: Diagnosis not present

## 2018-01-29 DIAGNOSIS — M545 Low back pain: Secondary | ICD-10-CM | POA: Diagnosis not present

## 2018-02-03 DIAGNOSIS — M545 Low back pain: Secondary | ICD-10-CM | POA: Diagnosis not present

## 2018-02-03 DIAGNOSIS — M2560 Stiffness of unspecified joint, not elsewhere classified: Secondary | ICD-10-CM | POA: Diagnosis not present

## 2018-02-03 DIAGNOSIS — M6281 Muscle weakness (generalized): Secondary | ICD-10-CM | POA: Diagnosis not present

## 2018-02-05 DIAGNOSIS — M2560 Stiffness of unspecified joint, not elsewhere classified: Secondary | ICD-10-CM | POA: Diagnosis not present

## 2018-02-05 DIAGNOSIS — M545 Low back pain: Secondary | ICD-10-CM | POA: Diagnosis not present

## 2018-02-05 DIAGNOSIS — M6281 Muscle weakness (generalized): Secondary | ICD-10-CM | POA: Diagnosis not present

## 2018-02-10 DIAGNOSIS — M2560 Stiffness of unspecified joint, not elsewhere classified: Secondary | ICD-10-CM | POA: Diagnosis not present

## 2018-02-10 DIAGNOSIS — M6281 Muscle weakness (generalized): Secondary | ICD-10-CM | POA: Diagnosis not present

## 2018-02-10 DIAGNOSIS — M545 Low back pain: Secondary | ICD-10-CM | POA: Diagnosis not present

## 2018-02-12 DIAGNOSIS — M2560 Stiffness of unspecified joint, not elsewhere classified: Secondary | ICD-10-CM | POA: Diagnosis not present

## 2018-02-12 DIAGNOSIS — M6281 Muscle weakness (generalized): Secondary | ICD-10-CM | POA: Diagnosis not present

## 2018-02-12 DIAGNOSIS — M545 Low back pain: Secondary | ICD-10-CM | POA: Diagnosis not present

## 2018-02-17 DIAGNOSIS — M545 Low back pain: Secondary | ICD-10-CM | POA: Diagnosis not present

## 2018-02-17 DIAGNOSIS — M2560 Stiffness of unspecified joint, not elsewhere classified: Secondary | ICD-10-CM | POA: Diagnosis not present

## 2018-02-17 DIAGNOSIS — M6281 Muscle weakness (generalized): Secondary | ICD-10-CM | POA: Diagnosis not present

## 2018-02-19 DIAGNOSIS — M545 Low back pain: Secondary | ICD-10-CM | POA: Diagnosis not present

## 2018-02-19 DIAGNOSIS — M6281 Muscle weakness (generalized): Secondary | ICD-10-CM | POA: Diagnosis not present

## 2018-02-19 DIAGNOSIS — M2560 Stiffness of unspecified joint, not elsewhere classified: Secondary | ICD-10-CM | POA: Diagnosis not present

## 2018-02-27 DIAGNOSIS — M65331 Trigger finger, right middle finger: Secondary | ICD-10-CM | POA: Diagnosis not present

## 2018-02-27 DIAGNOSIS — M65341 Trigger finger, right ring finger: Secondary | ICD-10-CM | POA: Diagnosis not present

## 2018-03-16 DIAGNOSIS — M5136 Other intervertebral disc degeneration, lumbar region: Secondary | ICD-10-CM | POA: Diagnosis not present

## 2018-04-02 DIAGNOSIS — M5136 Other intervertebral disc degeneration, lumbar region: Secondary | ICD-10-CM | POA: Diagnosis not present

## 2018-04-24 DIAGNOSIS — M5136 Other intervertebral disc degeneration, lumbar region: Secondary | ICD-10-CM | POA: Diagnosis not present

## 2018-04-24 DIAGNOSIS — M549 Dorsalgia, unspecified: Secondary | ICD-10-CM | POA: Diagnosis not present

## 2018-05-15 ENCOUNTER — Telehealth: Payer: Self-pay | Admitting: Family Medicine

## 2018-05-15 DIAGNOSIS — Z79899 Other long term (current) drug therapy: Secondary | ICD-10-CM

## 2018-05-15 DIAGNOSIS — Z125 Encounter for screening for malignant neoplasm of prostate: Secondary | ICD-10-CM

## 2018-05-15 DIAGNOSIS — Z13220 Encounter for screening for lipoid disorders: Secondary | ICD-10-CM

## 2018-05-15 NOTE — Telephone Encounter (Signed)
Patient has physical scheduled 10/4 and needing lab papers.

## 2018-06-01 ENCOUNTER — Telehealth: Payer: Self-pay | Admitting: *Deleted

## 2018-06-01 NOTE — Telephone Encounter (Signed)
Error

## 2018-06-01 NOTE — Telephone Encounter (Signed)
Blood work ordered in Epic. Patient notified. 

## 2018-06-01 NOTE — Telephone Encounter (Signed)
Rep same 

## 2018-06-01 NOTE — Addendum Note (Signed)
Addended by: Dairl Ponder on: 06/01/2018 02:47 PM   Modules accepted: Orders

## 2018-06-01 NOTE — Telephone Encounter (Signed)
Last labs 05/2017: Lipid, Liver, Met 7 and psa

## 2018-06-06 DIAGNOSIS — Z79899 Other long term (current) drug therapy: Secondary | ICD-10-CM | POA: Diagnosis not present

## 2018-06-06 DIAGNOSIS — Z13220 Encounter for screening for lipoid disorders: Secondary | ICD-10-CM | POA: Diagnosis not present

## 2018-06-06 DIAGNOSIS — Z125 Encounter for screening for malignant neoplasm of prostate: Secondary | ICD-10-CM | POA: Diagnosis not present

## 2018-06-07 LAB — BASIC METABOLIC PANEL
BUN/Creatinine Ratio: 20 (ref 10–24)
BUN: 15 mg/dL (ref 8–27)
CO2: 26 mmol/L (ref 20–29)
Calcium: 9.6 mg/dL (ref 8.6–10.2)
Chloride: 102 mmol/L (ref 96–106)
Creatinine, Ser: 0.75 mg/dL — ABNORMAL LOW (ref 0.76–1.27)
GFR calc Af Amer: 114 mL/min/{1.73_m2} (ref >59)
GFR calc non Af Amer: 98 mL/min/{1.73_m2} (ref >59)
Glucose: 88 mg/dL (ref 65–99)
Potassium: 4 mmol/L (ref 3.5–5.2)
Sodium: 141 mmol/L (ref 134–144)

## 2018-06-07 LAB — HEPATIC FUNCTION PANEL
ALT: 19 IU/L (ref 0–44)
AST: 17 IU/L (ref 0–40)
Albumin: 4.2 g/dL (ref 3.6–4.8)
Alkaline Phosphatase: 66 IU/L (ref 39–117)
Bilirubin Total: 0.3 mg/dL (ref 0.0–1.2)
Bilirubin, Direct: 0.06 mg/dL (ref 0.00–0.40)
Total Protein: 7.3 g/dL (ref 6.0–8.5)

## 2018-06-07 LAB — LIPID PANEL
Chol/HDL Ratio: 2.8 ratio (ref 0.0–5.0)
Cholesterol, Total: 196 mg/dL (ref 100–199)
HDL: 70 mg/dL (ref >39)
LDL Calculated: 120 mg/dL — ABNORMAL HIGH (ref 0–99)
Triglycerides: 32 mg/dL (ref 0–149)
VLDL Cholesterol Cal: 6 mg/dL (ref 5–40)

## 2018-06-07 LAB — PSA: Prostate Specific Ag, Serum: 2.4 ng/mL (ref 0.0–4.0)

## 2018-06-19 ENCOUNTER — Encounter: Payer: Self-pay | Admitting: Family Medicine

## 2018-06-19 ENCOUNTER — Ambulatory Visit (INDEPENDENT_AMBULATORY_CARE_PROVIDER_SITE_OTHER): Payer: 59 | Admitting: Family Medicine

## 2018-06-19 VITALS — BP 118/78 | Ht 73.0 in | Wt 190.1 lb

## 2018-06-19 DIAGNOSIS — Z0000 Encounter for general adult medical examination without abnormal findings: Secondary | ICD-10-CM

## 2018-06-19 DIAGNOSIS — N400 Enlarged prostate without lower urinary tract symptoms: Secondary | ICD-10-CM

## 2018-06-19 DIAGNOSIS — M5441 Lumbago with sciatica, right side: Secondary | ICD-10-CM | POA: Diagnosis not present

## 2018-06-19 DIAGNOSIS — G8929 Other chronic pain: Secondary | ICD-10-CM | POA: Diagnosis not present

## 2018-06-19 MED ORDER — TAMSULOSIN HCL 0.4 MG PO CAPS: 0.4000 mg | ORAL_CAPSULE | Freq: Every day | ORAL | 11 refills | Status: DC

## 2018-06-19 MED ORDER — HYDROCODONE-ACETAMINOPHEN 10-325 MG PO TABS: 1.0000 | ORAL_TABLET | Freq: Four times a day (QID) | ORAL | 0 refills | Status: DC | PRN

## 2018-06-19 NOTE — Progress Notes (Signed)
Subjective:    Patient ID: Ricardo Pacheco, male    DOB: Jan 23, 1956, 62 y.o.   MRN: [id number]  HPI  The patient comes in today for a wellness visit, and chronic pain     A review of their health history was completed.  A review of medications was also completed.  Any needed refills; Yes   Eating habits: Good  Falls/  MVA accidents in past few months: None  Regular exercise: Yes  Specialist pt sees on regular basis: Yes, he sees Dr.Brooks neurosurgeon, and [facility] for injection is his hands, Dr. Nelva Bush for "shots in his back"  Preventative health issues were discussed.   Additional concerns: None  This patient was seen today for chronic pain  The medication list was reviewed and updated.   -Compliance with medication: Yes  - Number patient states they take daily: 1 per day prn  -when was the last dose patient took? Yesterday  The patient was advised the importance of maintaining medication and not using illegal substances with these.  Here for refills and follow up  The patient was educated that we can provide 3 monthly scripts for their medication, it is their responsibility to follow the instructions.  Side effects or complications from medications: None  Patient is aware that pain medications are meant to minimize the severity of the pain to allow their pain levels to improve to allow for better function. They are aware of that pain medications cannot totally remove their pain.  Due for UDT ( at least once per year) :   l 4 and l5 has collapsed as far as the disc, ws looking sat surgery, now considering epidural   pts job going away within twelve mo or so   Getting injections every three months with the hand  Pt now working with the [place]  Results for orders placed or performed in visit on 05/15/18  Lipid panel  Result Value Ref Range   Cholesterol, Total 196 100 - 199 mg/dL   Triglycerides 32 0 - 149 mg/dL   HDL 70 >39 mg/dL   VLDL Cholesterol Cal 6 5 - 40  mg/dL   LDL Calculated 120 (H) 0 - 99 mg/dL   Chol/HDL Ratio 2.8 0.0 - 5.0 ratio  Hepatic function panel  Result Value Ref Range   Total Protein 7.3 6.0 - 8.5 g/dL   Albumin 4.2 3.6 - 4.8 g/dL   Bilirubin Total 0.3 0.0 - 1.2 mg/dL   Bilirubin, Direct 0.06 0.00 - 0.40 mg/dL   Alkaline Phosphatase 66 39 - 117 IU/L   AST 17 0 - 40 IU/L   ALT 19 0 - 44 IU/L  Basic metabolic panel  Result Value Ref Range   Glucose 88 65 - 99 mg/dL   BUN 15 8 - 27 mg/dL   Creatinine, Ser 0.75 (L) 0.76 - 1.27 mg/dL   GFR calc non Af Amer 98 >59 mL/min/1.73   GFR calc Af Amer 114 >59 mL/min/1.73   BUN/Creatinine Ratio 20 10 - 24   Sodium 141 134 - 144 mmol/L   Potassium 4.0 3.5 - 5.2 mmol/L   Chloride 102 96 - 106 mmol/L   CO2 26 20 - 29 mmol/L   Calcium 9.6 8.6 - 10.2 mg/dL  PSA  Result Value Ref Range   Prostate Specific Ag, Serum 2.4 0.0 - 4.0 ng/mL   Good resuts on the blood      Review of Systems  Constitutional: Negative for activity change, appetite change and  fever.  HENT: Negative for congestion and rhinorrhea.   Eyes: Negative for discharge.  Respiratory: Negative for cough and wheezing.   Cardiovascular: Negative for chest pain.  Gastrointestinal: Negative for abdominal pain, blood in stool and vomiting.  Genitourinary: Negative for difficulty urinating and frequency.  Musculoskeletal: Negative for neck pain.  Skin: Negative for rash.  Allergic/Immunologic: Negative for environmental allergies and food allergies.  Neurological: Negative for weakness and headaches.  Psychiatric/Behavioral: Negative for agitation.  All other systems reviewed and are negative.      Objective:   Physical Exam  Constitutional: He appears well-developed and well-nourished.  HENT:  Head: Normocephalic and atraumatic.  Right Ear: External ear normal.  Left Ear: External ear normal.  Nose: Nose normal.  Mouth/Throat: Oropharynx is clear and moist.  Eyes: Pupils are equal, round, and reactive to  light. EOM are normal.  Neck: Normal range of motion. Neck supple. No thyromegaly present.  Cardiovascular: Normal rate, regular rhythm and normal heart sounds.  No murmur heard. Pulmonary/Chest: Effort normal and breath sounds normal. No respiratory distress. He has no wheezes.  Abdominal: Soft. Bowel sounds are normal. He exhibits no distension and no mass. There is no tenderness.  Genitourinary: Penis normal.  Musculoskeletal: Normal range of motion. He exhibits no edema.  Lymphadenopathy:    He has no cervical adenopathy.  Neurological: He is alert. He exhibits normal muscle tone.  Skin: Skin is warm and dry. No erythema.  Psychiatric: He has a normal mood and affect. His behavior is normal. Judgment normal.  Vitals reviewed.         Assessment & Plan:  Impression wellness exam.  Diet discussed.  Exercise discussed.  Discussed.  Blood work discussed with  Impression: Chronic pain. Patient compliant with medication. No substantial side effects. [place] controlled substance registry reviewed to ensure compliance and proper use of medication. Patient aware goal of medicine is not complete resolution of pain but to control his symptoms to improve his functional capacity. Aware of potential adverse side effects  Patient uses his medication intake.  Tolerates well no obvious side effects present today  3.  Prostate hypertrophy.  Occasional handles medication well.  No obvious side effects.  Medicines refilled

## 2018-06-19 NOTE — Patient Instructions (Signed)
Results for orders placed or performed in visit on 05/15/18  Lipid panel  Result Value Ref Range   Cholesterol, Total 196 100 - 199 mg/dL   Triglycerides 32 0 - 149 mg/dL   HDL 70 >39 mg/dL   VLDL Cholesterol Cal 6 5 - 40 mg/dL   LDL Calculated 120 (H) 0 - 99 mg/dL   Chol/HDL Ratio 2.8 0.0 - 5.0 ratio  Hepatic function panel  Result Value Ref Range   Total Protein 7.3 6.0 - 8.5 g/dL   Albumin 4.2 3.6 - 4.8 g/dL   Bilirubin Total 0.3 0.0 - 1.2 mg/dL   Bilirubin, Direct 0.06 0.00 - 0.40 mg/dL   Alkaline Phosphatase 66 39 - 117 IU/L   AST 17 0 - 40 IU/L   ALT 19 0 - 44 IU/L  Basic metabolic panel  Result Value Ref Range   Glucose 88 65 - 99 mg/dL   BUN 15 8 - 27 mg/dL   Creatinine, Ser 0.75 (L) 0.76 - 1.27 mg/dL   GFR calc non Af Amer 98 >59 mL/min/1.73   GFR calc Af Amer 114 >59 mL/min/1.73   BUN/Creatinine Ratio 20 10 - 24   Sodium 141 134 - 144 mmol/L   Potassium 4.0 3.5 - 5.2 mmol/L   Chloride 102 96 - 106 mmol/L   CO2 26 20 - 29 mmol/L   Calcium 9.6 8.6 - 10.2 mg/dL  PSA  Result Value Ref Range   Prostate Specific Ag, Serum 2.4 0.0 - 4.0 ng/mL

## 2018-07-24 ENCOUNTER — Other Ambulatory Visit: Payer: Self-pay | Admitting: Family Medicine

## 2018-08-04 ENCOUNTER — Other Ambulatory Visit: Payer: Self-pay | Admitting: Family Medicine

## 2018-08-27 DIAGNOSIS — M5136 Other intervertebral disc degeneration, lumbar region: Secondary | ICD-10-CM | POA: Diagnosis not present

## 2018-09-08 ENCOUNTER — Other Ambulatory Visit: Payer: Self-pay | Admitting: Family Medicine

## 2018-12-01 ENCOUNTER — Other Ambulatory Visit: Payer: Self-pay | Admitting: Family Medicine

## 2019-02-09 ENCOUNTER — Other Ambulatory Visit: Payer: Self-pay | Admitting: Family Medicine

## 2019-05-11 ENCOUNTER — Other Ambulatory Visit: Payer: Self-pay | Admitting: Family Medicine

## 2019-05-14 DIAGNOSIS — M65352 Trigger finger, left little finger: Secondary | ICD-10-CM | POA: Insufficient documentation

## 2019-06-23 ENCOUNTER — Telehealth: Payer: Self-pay | Admitting: Family Medicine

## 2019-06-23 DIAGNOSIS — Z79899 Other long term (current) drug therapy: Secondary | ICD-10-CM

## 2019-06-23 DIAGNOSIS — Z13220 Encounter for screening for lipoid disorders: Secondary | ICD-10-CM

## 2019-06-23 DIAGNOSIS — Z125 Encounter for screening for malignant neoplasm of prostate: Secondary | ICD-10-CM

## 2019-06-23 NOTE — Telephone Encounter (Signed)
Blood work ordered in Epic. Left message to return call 

## 2019-06-23 NOTE — Telephone Encounter (Signed)
Last labs completed on 06/06/18 PSA, BMET, Hepatic and Lipid. Please advise. Thank you

## 2019-06-23 NOTE — Telephone Encounter (Signed)
Patient notifed

## 2019-06-23 NOTE — Telephone Encounter (Signed)
Patient has appointment schedule for physical 10/20 and needing labs done

## 2019-06-23 NOTE — Telephone Encounter (Signed)
Rep same 

## 2019-06-26 LAB — LIPID PANEL
Chol/HDL Ratio: 2.7 ratio (ref 0.0–5.0)
Cholesterol, Total: 194 mg/dL (ref 100–199)
HDL: 71 mg/dL (ref >39)
LDL Chol Calc (NIH): 115 mg/dL — ABNORMAL HIGH (ref 0–99)
Triglycerides: 40 mg/dL (ref 0–149)
VLDL Cholesterol Cal: 8 mg/dL (ref 5–40)

## 2019-06-26 LAB — BASIC METABOLIC PANEL
BUN/Creatinine Ratio: 16 (ref 10–24)
BUN: 14 mg/dL (ref 8–27)
CO2: 25 mmol/L (ref 20–29)
Calcium: 9.5 mg/dL (ref 8.6–10.2)
Chloride: 105 mmol/L (ref 96–106)
Creatinine, Ser: 0.87 mg/dL (ref 0.76–1.27)
GFR calc Af Amer: 106 mL/min/{1.73_m2} (ref >59)
GFR calc non Af Amer: 92 mL/min/{1.73_m2} (ref >59)
Glucose: 101 mg/dL — ABNORMAL HIGH (ref 65–99)
Potassium: 4.3 mmol/L (ref 3.5–5.2)
Sodium: 141 mmol/L (ref 134–144)

## 2019-06-26 LAB — HEPATIC FUNCTION PANEL
ALT: 21 IU/L (ref 0–44)
AST: 20 IU/L (ref 0–40)
Albumin: 4.1 g/dL (ref 3.8–4.8)
Alkaline Phosphatase: 76 IU/L (ref 39–117)
Bilirubin Total: 0.3 mg/dL (ref 0.0–1.2)
Bilirubin, Direct: 0.1 mg/dL (ref 0.00–0.40)
Total Protein: 7.2 g/dL (ref 6.0–8.5)

## 2019-06-26 LAB — PSA: Prostate Specific Ag, Serum: 2.5 ng/mL (ref 0.0–4.0)

## 2019-07-06 ENCOUNTER — Other Ambulatory Visit: Payer: Self-pay

## 2019-07-06 ENCOUNTER — Encounter: Payer: Self-pay | Admitting: Family Medicine

## 2019-07-06 ENCOUNTER — Ambulatory Visit (INDEPENDENT_AMBULATORY_CARE_PROVIDER_SITE_OTHER): Payer: PRIVATE HEALTH INSURANCE | Admitting: Family Medicine

## 2019-07-06 VITALS — BP 128/84 | Temp 97.0°F | Ht 73.0 in | Wt 208.6 lb

## 2019-07-06 DIAGNOSIS — Z23 Encounter for immunization: Secondary | ICD-10-CM | POA: Diagnosis not present

## 2019-07-06 DIAGNOSIS — M5441 Lumbago with sciatica, right side: Secondary | ICD-10-CM

## 2019-07-06 DIAGNOSIS — Z0000 Encounter for general adult medical examination without abnormal findings: Secondary | ICD-10-CM | POA: Diagnosis not present

## 2019-07-06 DIAGNOSIS — G8929 Other chronic pain: Secondary | ICD-10-CM | POA: Diagnosis not present

## 2019-07-06 DIAGNOSIS — N400 Enlarged prostate without lower urinary tract symptoms: Secondary | ICD-10-CM | POA: Diagnosis not present

## 2019-07-06 DIAGNOSIS — Z79899 Other long term (current) drug therapy: Secondary | ICD-10-CM | POA: Diagnosis not present

## 2019-07-06 MED ORDER — HYDROCODONE-ACETAMINOPHEN 10-325 MG PO TABS: 1.0000 | ORAL_TABLET | Freq: Four times a day (QID) | ORAL | 0 refills | Status: DC | PRN

## 2019-07-06 MED ORDER — HYDROCODONE-ACETAMINOPHEN 10-325 MG PO TABS: ORAL_TABLET | ORAL | 0 refills | Status: DC

## 2019-07-06 MED ORDER — TAMSULOSIN HCL 0.4 MG PO CAPS: 0.4000 mg | ORAL_CAPSULE | Freq: Every day | ORAL | 5 refills | Status: DC

## 2019-07-06 NOTE — Progress Notes (Signed)
Subjective:    Patient ID: Ricardo Pacheco, male    DOB: June 05, 1956, 63 y.o.   MRN: [id number]  HPI  The patient comes in today for a wellness visit.    A review of their health history was completed.  A review of medications was also completed.  Any needed refills; yes  Eating habits: eats good  Falls/  MVA accidents in past few months: none  Regular exercise: walks all the time  Specialist pt sees on regular basis: ortho for injections in joints  Preventative health issues were discussed.   Additional concerns: discuss recent labs  Results for orders placed or performed in visit on 06/23/19  PSA  Result Value Ref Range   Prostate Specific Ag, Serum 2.5 0.0 - 4.0 ng/mL  Basic metabolic panel  Result Value Ref Range   Glucose 101 (H) 65 - 99 mg/dL   BUN 14 8 - 27 mg/dL   Creatinine, Ser 0.87 0.76 - 1.27 mg/dL   GFR calc non Af Amer 92 >59 mL/min/1.73   GFR calc Af Amer 106 >59 mL/min/1.73   BUN/Creatinine Ratio 16 10 - 24   Sodium 141 134 - 144 mmol/L   Potassium 4.3 3.5 - 5.2 mmol/L   Chloride 105 96 - 106 mmol/L   CO2 25 20 - 29 mmol/L   Calcium 9.5 8.6 - 10.2 mg/dL  Hepatic function panel  Result Value Ref Range   Total Protein 7.2 6.0 - 8.5 g/dL   Albumin 4.1 3.8 - 4.8 g/dL   Bilirubin Total 0.3 0.0 - 1.2 mg/dL   Bilirubin, Direct 0.10 0.00 - 0.40 mg/dL   Alkaline Phosphatase 76 39 - 117 IU/L   AST 20 0 - 40 IU/L   ALT 21 0 - 44 IU/L  Lipid panel  Result Value Ref Range   Cholesterol, Total 194 100 - 199 mg/dL   Triglycerides 40 0 - 149 mg/dL   HDL 71 >39 mg/dL   VLDL Cholesterol Cal 8 5 - 40 mg/dL   LDL Chol Calc (NIH) 115 (H) 0 - 99 mg/dL   Chol/HDL Ratio 2.7 0.0 - 5.0 ratio   Diet so so  Patient compliant with pain medication. Continues to experience the pain which led to initiation of analgesic intervention. No significant negative side effects. States definitely needs the pain medication to maintain current level of functioning. Does not receive  controlled substance pain medication elsewhere.  Exercising we'll  Last colon done 2018  Review of Systems  Constitutional: Negative for activity change, appetite change and fever.  HENT: Negative for congestion and rhinorrhea.   Eyes: Negative for discharge.  Respiratory: Negative for cough and wheezing.   Cardiovascular: Negative for chest pain.  Gastrointestinal: Negative for abdominal pain, blood in stool and vomiting.  Genitourinary: Negative for difficulty urinating and frequency.  Musculoskeletal: Negative for neck pain.  Skin: Negative for rash.  Allergic/Immunologic: Negative for environmental allergies and food allergies.  Neurological: Negative for weakness and headaches.  Psychiatric/Behavioral: Negative for agitation.  All other systems reviewed and are negative.      Objective:   Physical Exam Vitals signs reviewed.  Constitutional:      Appearance: He is well-developed.  HENT:     Head: Normocephalic and atraumatic.     Right Ear: External ear normal.     Left Ear: External ear normal.     Nose: Nose normal.  Eyes:     Pupils: Pupils are equal, round, and reactive to light.  Neck:  Musculoskeletal: Normal range of motion and neck supple.     Thyroid: No thyromegaly.  Cardiovascular:     Rate and Rhythm: Normal rate and regular rhythm.     Heart sounds: Normal heart sounds. No murmur.  Pulmonary:     Effort: Pulmonary effort is normal. No respiratory distress.     Breath sounds: Normal breath sounds. No wheezing.  Abdominal:     General: Bowel sounds are normal. There is no distension.     Palpations: Abdomen is soft. There is no mass.     Tenderness: There is no abdominal tenderness.  Genitourinary:    Penis: Normal.   Musculoskeletal: Normal range of motion.  Lymphadenopathy:     Cervical: No cervical adenopathy.  Skin:    General: Skin is warm and dry.     Findings: No erythema.  Neurological:     Mental Status: He is alert.     Motor: No  abnormal muscle tone.  Psychiatric:        Behavior: Behavior normal.        Judgment: Judgment normal.           Assessment & Plan:  Impression 1 wellness exam.  Diet discussed.  Exercise discussed.  Up-to-date on colonoscopy.  Screening blood work reviewed.  PSA excellentColon next one due ten yrs,   today  2.  Chronic pain  Impression: Chronic pain. Patient compliant with medication. No substantial side effects. [place] controlled substance registry reviewed to ensure compliance and proper use of medication. Patient aware goal of medicine is not complete resolution of pain but to control his symptoms to improve his functional capacity. Aware of potential adverse side effects   3.  Recurrent back pain uses as needed muscle spasm medicine refilled  4.  Prostate hypertrophy.  Flomax definitely helping will maintain for 1 year  Diet exercise discussed

## 2019-07-12 ENCOUNTER — Other Ambulatory Visit: Payer: Self-pay | Admitting: Family Medicine

## 2019-10-15 ENCOUNTER — Other Ambulatory Visit: Payer: Self-pay | Admitting: Family Medicine

## 2019-10-21 ENCOUNTER — Encounter: Payer: Self-pay | Admitting: Family Medicine

## 2019-12-08 ENCOUNTER — Other Ambulatory Visit: Payer: Self-pay | Admitting: Family Medicine

## 2019-12-08 NOTE — Telephone Encounter (Signed)
Pt is also requesting refill on HYDROcodone-acetaminophen (NORCO) 10-325 MG tablet

## 2019-12-10 MED ORDER — HYDROCODONE-ACETAMINOPHEN 10-325 MG PO TABS: 1.0000 | ORAL_TABLET | Freq: Four times a day (QID) | ORAL | 0 refills | Status: DC | PRN

## 2019-12-10 NOTE — Telephone Encounter (Signed)
Ok hydrocod times one, other meds times three

## 2020-02-10 ENCOUNTER — Ambulatory Visit (HOSPITAL_COMMUNITY)
Admission: RE | Admit: 2020-02-10 | Discharge: 2020-02-10 | Disposition: A | Discharge status: Home / Self Care | Payer: PRIVATE HEALTH INSURANCE | Source: Ambulatory Visit | Attending: Family Medicine | Admitting: Family Medicine

## 2020-02-10 ENCOUNTER — Other Ambulatory Visit: Payer: Self-pay

## 2020-02-10 ENCOUNTER — Encounter: Payer: Self-pay | Admitting: Family Medicine

## 2020-02-10 ENCOUNTER — Ambulatory Visit (INDEPENDENT_AMBULATORY_CARE_PROVIDER_SITE_OTHER): Payer: PRIVATE HEALTH INSURANCE | Admitting: Family Medicine

## 2020-02-10 VITALS — BP 118/82 | Temp 97.9°F | Wt 210.0 lb

## 2020-02-10 DIAGNOSIS — M25562 Pain in left knee: Secondary | ICD-10-CM | POA: Diagnosis present

## 2020-02-10 NOTE — Progress Notes (Signed)
   Subjective:    Patient ID: Ricardo Pacheco, male    DOB: [birth year], 64 y.o.   MRN: [id number]  Knee Pain  The pain is present in the left knee.   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  Focused on the medial knee region.  Very sharp with certain motions  Is also noticed swelling  Also has sensation of the pain causing her knee to give way at times   Review of Systems No hip pain no ankle pain    Objective:   Physical Exam  Alert vitals stable, NAD. Blood pressure good on repeat. HEENT normal. Lungs clear. Heart regular rate and rhythm. Left knee swollen medial joint line tenderness.  Positive Lachman sign      Assessment & Plan:  Impression probable medial meniscus injury.  Plan x-ray and follow-up knee.  Orthopedic referral.  Rationale discussed.  Anti-inflammatory medicine as needed

## 2020-02-16 DIAGNOSIS — M25562 Pain in left knee: Secondary | ICD-10-CM | POA: Insufficient documentation

## 2020-02-23 ENCOUNTER — Encounter: Payer: Self-pay | Admitting: Family Medicine

## 2020-03-03 ENCOUNTER — Other Ambulatory Visit: Payer: Self-pay | Admitting: Family Medicine

## 2020-03-03 NOTE — Telephone Encounter (Signed)
Please schedule and then route back.  

## 2020-03-03 NOTE — Telephone Encounter (Signed)
Pt made appt for Monday 06/28

## 2020-03-13 ENCOUNTER — Other Ambulatory Visit: Payer: Self-pay

## 2020-03-13 ENCOUNTER — Encounter: Payer: Self-pay | Admitting: Family Medicine

## 2020-03-13 ENCOUNTER — Ambulatory Visit (INDEPENDENT_AMBULATORY_CARE_PROVIDER_SITE_OTHER): Payer: PRIVATE HEALTH INSURANCE | Admitting: Family Medicine

## 2020-03-13 VITALS — BP 124/78 | HR 98 | Temp 96.3°F | Ht 73.0 in | Wt 209.4 lb

## 2020-03-13 DIAGNOSIS — Z0000 Encounter for general adult medical examination without abnormal findings: Secondary | ICD-10-CM | POA: Diagnosis not present

## 2020-03-13 DIAGNOSIS — M25462 Effusion, left knee: Secondary | ICD-10-CM | POA: Diagnosis not present

## 2020-03-13 DIAGNOSIS — Z125 Encounter for screening for malignant neoplasm of prostate: Secondary | ICD-10-CM

## 2020-03-13 DIAGNOSIS — M5441 Lumbago with sciatica, right side: Secondary | ICD-10-CM

## 2020-03-13 DIAGNOSIS — G8929 Other chronic pain: Secondary | ICD-10-CM

## 2020-03-13 MED ORDER — NABUMETONE 500 MG PO TABS: ORAL_TABLET | ORAL | 5 refills | Status: DC

## 2020-03-13 MED ORDER — CHLORZOXAZONE 500 MG PO TABS: ORAL_TABLET | ORAL | 5 refills | Status: DC

## 2020-03-13 MED ORDER — HYDROCODONE-ACETAMINOPHEN 10-325 MG PO TABS: ORAL_TABLET | ORAL | 0 refills | Status: AC

## 2020-03-13 NOTE — Progress Notes (Signed)
Patient ID: Ricardo Pacheco, male    DOB: 05/14/1956, 64 y.o.   MRN: [id number]   Chief Complaint  Patient presents with  . Follow-up    Pt here for med follow up. Pt is prescribed Hydrocodone 10-325 mg; pt is prescribed these due to back pain. Pt states he takes 0-1 tablet daily.    Subjective:    HPI   Pt having chronic back pain and left knee pain.   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  Pt taking prn norco 19m,  60 tablets and lasting whole year with 2 scripts.  Some days doesn't take the pain medication. Only using it prn when lumbar injections wear off from Orthopedics. [facility] for [facility] pain and injections. On 02/16/20- having swelling and fluid on knee.  Went to [facility] and seeing Dr. CTheda Sersfor the knee pain.  Dr. CCherly Beachleft knee with recent bursitis and I&D and f/u on MRI this week.   No new pain or injury to back.   Medical History JLiviohas a past medical history of Decreased white blood cell count.   Outpatient Encounter Medications as of 03/13/2020  Medication Sig  . cyclobenzaprine (FLEXERIL) 10 MG tablet TAKE (1) TABLET BY MOUTH EVERY EIGHT HOURS.  . HYDROcodone-acetaminophen (NORCO) 10-325 MG tablet Take 1 tablet by mouth every 6 (six) hours as needed for moderate pain.,  . Multiple Vitamin (MULTIVITAMIN WITH MINERALS) TABS tablet Take 1 tablet by mouth daily.  . tamsulosin (FLOMAX) 0.4 MG CAPS capsule Take 1 capsule (0.4 mg total) by mouth daily.  . [DISCONTINUED] chlorzoxazone (PARAFON) 500 MG tablet Take 500 mg by mouth 4 (four) times daily as needed for muscle spasms. Pt said he has it on hand, but rarely takes it   . [DISCONTINUED] HYDROcodone-acetaminophen (NORCO) 10-325 MG tablet Take 1 tablet by mouth every 6 (six) hours as needed for moderate pain.,  . [DISCONTINUED] HYDROcodone-acetaminophen (NORCO) 10-325 MG tablet Take 1 tablet by mouth every 6 (six) hours as needed for moderate pain.  . [DISCONTINUED]  nabumetone (RELAFEN) 500 MG tablet TAKE ONE TABLET TWICE A DAY WITH FOOD AS NEEDED.  . chlorzoxazone (PARAFON) 500 MG tablet TAKE 1 TABLET BY MOUTH THREE TIMES DAILY AS NEEDED FOR SPASMS.  . nabumetone (RELAFEN) 500 MG tablet TAKE ONE TABLET TWICE A DAY WITH FOOD AS NEEDED.   No facility-administered encounter medications on file as of 03/13/2020.     Review of Systems  Constitutional: Negative for chills and fever.  HENT: Negative for congestion, rhinorrhea and sore throat.   Respiratory: Negative for cough, shortness of breath and wheezing.   Cardiovascular: Negative for chest pain and leg swelling.  Gastrointestinal: Negative for abdominal pain, diarrhea, nausea and vomiting.  Genitourinary: Negative for dysuria and frequency.  Musculoskeletal: Positive for back pain (chronic ) and joint swelling (left knee swelling).  Skin: Negative for rash.  Neurological: Negative for dizziness, weakness and headaches.     Vitals BP 124/78   Pulse 98   Temp (!) 96.3 F (35.7 C)   Ht 6' 1"  (1.854 m)   Wt 209 lb 6.4 oz (95 kg)   SpO2 98%   BMI 27.63 kg/m   Objective:   Physical Exam Vitals and nursing note reviewed.  Constitutional:      General: He is not in acute distress.    Appearance: Normal appearance. He is not ill-appearing.  HENT:     Head: Normocephalic.  Cardiovascular:  Rate and Rhythm: Normal rate and regular rhythm.     Pulses: Normal pulses.     Heart sounds: Normal heart sounds. No murmur heard.   Pulmonary:     Effort: Pulmonary effort is normal. No respiratory distress.     Breath sounds: Normal breath sounds. No wheezing, rhonchi or rales.  Musculoskeletal:        General: Swelling (left knee) present. Normal range of motion.     Right lower leg: No edema.     Left lower leg: No edema.     Comments: No ttp over thoracic and lumbar spinous proc.  Normal rom of back.  Normal skin inspection.  Skin:    General: Skin is warm and dry.     Findings: No  rash.  Neurological:     General: No focal deficit present.     Mental Status: He is alert and oriented to person, place, and time.     Cranial Nerves: No cranial nerve deficit.     Motor: No weakness.     Gait: Gait normal.  Psychiatric:        Mood and Affect: Mood normal.        Behavior: Behavior normal.        Thought Content: Thought content normal.        Judgment: Judgment normal.      Assessment and Plan   1. Chronic right-sided low back pain with right-sided sciatica - HYDROcodone-acetaminophen (NORCO) 10-325 MG tablet; Take 1 tablet by mouth every 6 (six) hours as needed for moderate pain.,  Dispense: 60 tablet; Refill: 0 - chlorzoxazone (PARAFON) 500 MG tablet; TAKE 1 TABLET BY MOUTH THREE TIMES DAILY AS NEEDED FOR SPASMS.  Dispense: 42 tablet; Refill: 5 - nabumetone (RELAFEN) 500 MG tablet; TAKE ONE TABLET TWICE A DAY WITH FOOD AS NEEDED.  Dispense: 42 tablet; Refill: 5  2. Effusion of left knee  3. Laboratory tests ordered as part of a complete physical exam (CPE) - CMP14+EGFR; Future - Lipid panel; Future - CBC; Future  4. Screening PSA (prostate specific antigen) - PSA; Future   Chronic lumbar back pain and rt sciatica- Pt given script for norco and in 6 months pt can have another refill or on next visit. Pt only taking prn.   Taking nabumetone and chlorzoxazone for spasm.  Pt to f/u with ortho for left knee effusion/bursitis.  F/u 57mofor CPE.

## 2020-04-07 ENCOUNTER — Other Ambulatory Visit: Payer: Self-pay

## 2020-04-07 ENCOUNTER — Encounter (HOSPITAL_COMMUNITY): Payer: Self-pay | Admitting: Physical Therapy

## 2020-04-07 ENCOUNTER — Ambulatory Visit (HOSPITAL_COMMUNITY): Payer: PRIVATE HEALTH INSURANCE | Attending: Specialist | Admitting: Physical Therapy

## 2020-04-07 DIAGNOSIS — M6281 Muscle weakness (generalized): Secondary | ICD-10-CM | POA: Insufficient documentation

## 2020-04-07 DIAGNOSIS — M25662 Stiffness of left knee, not elsewhere classified: Secondary | ICD-10-CM | POA: Diagnosis not present

## 2020-04-07 DIAGNOSIS — R2689 Other abnormalities of gait and mobility: Secondary | ICD-10-CM | POA: Diagnosis present

## 2020-04-07 DIAGNOSIS — R600 Localized edema: Secondary | ICD-10-CM | POA: Insufficient documentation

## 2020-04-07 NOTE — Patient Instructions (Signed)
Quad Set    With other leg bent, foot flat, slowly tighten muscles on thigh of straight leg while counting out loud to __5__.  Repeat _10___ times. Do __1-2__ sessions per day.  [email or web address].[email or web address]   Copyright  VHI. All rights reserved.  Heel Slide    Bend knee and pull heel toward buttocks. Hold _3-5___ seconds. Return. Repeat with other knee. Repeat __10__ times. Do _2-3___ sessions per day.  [email or web address].[email or web address]   Copyright  VHI. All rights reserved.

## 2020-04-07 NOTE — Therapy (Signed)
[place], [place], [address] Phone: [phone]   Fax:  [phone]  Physical Therapy Evaluation  Patient Details  Name: Ricardo Pacheco MRN: [id number] Date of Birth: August 09, 1959 Referring Provider (PT): Sydnee Cabal, MD   Encounter Date: 04/08/2019   PT End of Session - 04/07/20 1540    Visit Number 1    Number of Visits 12    Date for PT Re-Evaluation 05/19/20    Authorization Type [employer]    Authorization - Visit Number 1    Authorization - Number of Visits 60    Progress Note Due on Visit 10    PT Start Time 7001    PT Stop Time 1425    PT Time Calculation (min) 40 min    Equipment Utilized During Treatment Gait belt    Activity Tolerance Patient tolerated treatment well    Behavior During Therapy [facility] for tasks assessed/performed           Past Medical History:  Diagnosis Date  . Decreased white blood cell count     Past Surgical History:  Procedure Laterality Date  . COLONOSCOPY    . COLONOSCOPY N/A 07/18/2017   Procedure: COLONOSCOPY;  Surgeon: Danie Binder, MD;  [facility];  Service: Endoscopy;  Laterality: N/A;  2:15 pm  . POLYPECTOMY  07/18/2017   Procedure: POLYPECTOMY;  Surgeon: Danie Binder, MD;  [facility];  Service: Endoscopy;;  hepatic flexure    There were no vitals filed for this visit.    Subjective Assessment - 04/07/20 1354    Subjective Patient reported that he had a left knee scope on 03/30/20. Patient reported that he feels like he has a lot of swelling. He reported that he is going back to see the MD on 04/13/20. He stated that before he had the surgery he had fluid removed from his knee. He reported that he does a lot of walking and moving around at work.    Pertinent History LT knee scope 03/30/20    Limitations Standing;Walking    How long can you sit comfortably? Not limited    How long can you stand comfortably? 5 minutes    How long can you  walk comfortably? 5 minutes    Diagnostic tests X-ray LT knee    Patient Stated Goals Get back to where he was and being able to work    Currently in Pain? Yes    Pain Score 3     Pain Location Knee    Pain Orientation Left    Pain Descriptors / Indicators Dull;Throbbing    Pain Type Acute pain;Surgical pain    Pain Onset 1 to 4 weeks ago    Pain Frequency Constant    Aggravating Factors  Laying on the left side    Pain Relieving Factors Iceing his knee              [facility] PT Assessment - 04/07/20 0001      Assessment   Medical Diagnosis Left knee scope    Referring Provider (PT) Sydnee Cabal, MD    Onset Date/Surgical Date 03/30/20    Next MD Visit 04/13/20    Prior Therapy Yes, for back      Precautions   Precautions None      Restrictions   Weight Bearing Restrictions No      Balance Screen   Has the patient fallen in the past 6 months No  Home Environment   Living Environment Private residence    Type of [place] One level   3 steps   Whe[place]      Prior Function   Level of Independence Independent    Vocation Full time employment    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, walking      Cognition   Overall Cognitive Status Within Functional Limits for tasks assessed      Observation/Other Assessments   Focus on Therapeutic Outcomes (FOTO)  40%      Observation/Other Assessments-Edema    Edema Circumferential      Circumferential Edema   Circumferential - Right 42.5 cm at knee joint    Circumferential - Left  44 cm at knee joint      Sensation   Light Touch Appears Intact      ROM / Strength   AROM / PROM / Strength Strength;AROM      AROM   AROM Assessment Site Knee    Right/Left Knee Left    Left Knee Extension 2    Left Knee Flexion 91      Strength   Strength Assessment Site Hip;Knee;Ankle    Right/Left Hip Right;Left    Right Hip Flexion 5/5    Right Hip Extension 4-/5    Right Hip  External Rotation  4-/5    Right Hip ABduction 4+/5    Left Hip Flexion 3+/5    Left Hip Extension 3-/5    Left Hip ABduction 4+/5    Right/Left Knee Right;Left    Right Knee Flexion 5/5    Right Knee Extension 5/5    Left Knee Flexion 3+/5    Left Knee Extension 3+/5    Right/Left Ankle Right;Left    Right Ankle Dorsiflexion 5/5    Left Ankle Dorsiflexion 5/5      Palpation   Palpation comment Tenderness to superior knee      Ambulation/Gait   Ambulation/Gait Yes    Ambulation Distance (Feet) 377 Feet   2MWT   Assistive device None    Gait Pattern Decreased stance time - left;Decreased step length - right    Ambulation Surface Level;Indoor      Balance   Balance Assessed Yes      Static Standing Balance   Static Standing - Balance Support No upper extremity supported    Static Standing Balance -  Activities  Single Leg Stance - Right Leg;Single Leg Stance - Left Leg    Static Standing - Comment/# of Minutes 3 seconds each LE                      Objective measurements completed on examination: See above findings.               PT Education - 04/07/20 1532    Education Details Discussed examination findings, POC, and initial HEP.    Person(s) Educated Patient    Methods Explanation;Handout    Comprehension Verbalized understanding            PT Short Term Goals - 04/07/20 1532      PT SHORT TERM GOAL #1   Title Patient will report understanding and regular compliance with HEP to improve mobility, strength and improve overall functional mobility.    Time 3    Period Weeks    Status New    Target Date 04/28/20      PT SHORT  TERM GOAL #2   Title Patient will demonstrate left knee extension/flexion active range of motion of at least 0-100 degrees to assist with more normalized gait pattern and stair ambulation.    Time 3    Period Weeks    Status New    Target Date 04/28/20             PT Long Term Goals - 04/07/20 1537      PT  LONG TERM GOAL #1   Title Patient will perform single limb stance on left/right lower extremity for 10 seconds in order to assist with stair ambulation.    Time 6    Period Weeks    Status New    Target Date 05/19/20      PT LONG TERM GOAL #2   Title Patient will improve ROM for left knee extension/flexion to 0-120 degrees to improve squatting, and other functional mobility.    Time 6    Period Weeks    Status New    Target Date 05/19/20      PT LONG TERM GOAL #3   Title Patient will demonstrate at least 4+/5 strength in all tested muscle groups to improve overall functional strength and mobility.    Time 6    Period Weeks    Status New    Target Date 05/19/20      PT LONG TERM GOAL #4   Title Patient will demonstrate ability to ambulate at least 1.0 m/s on 2MWT with minimal to no gait deviations for improved ambulation at work.    Time 6    Period Weeks    Status New    Target Date 05/19/20                  Plan - 04/07/20 1544    Clinical Impression Statement Patient is a 64 year old male who presents to outpatient physical therapy S/P left knee scope on 03/30/20. Patient presents with typical post-operative deficits including edema, weakness, decreased ROM, and decreased balance. Educated patient on initial HEP to improve AROM and discussed edema reduction techniques. Patient would benefit from continued skilled physical therapy to address the abovementioned deficits and help patient return to his PLOF and return to work.    Personal Factors and Comorbidities Age    Examination-Activity Limitations Stand;Locomotion Level;Squat;Stairs    Examination-Participation Restrictions Yard Work;Cleaning;Meal Prep;Community Activity    Stability/Clinical Decision Making Stable/Uncomplicated    Clinical Decision Making Low    Rehab Potential Good    PT Frequency 2x / week    PT Duration 6 weeks    PT Treatment/Interventions ADLs/Self Care Home Management;Aquatic  Therapy;Cryotherapy;DME Instruction;Electrical Stimulation;Traction;Moist Heat;Gait training;Stair training;Functional mobility training;Therapeutic activities;Therapeutic exercise;Balance training;Neuromuscular re-education;Patient/family education;Orthotic Fit/Training;Manual techniques;Passive range of motion;Dry needling;Taping    PT Next Visit Plan Focus on AROM initially and edema reduction. Gait training. Progress to strengthening and balance.    PT Home Exercise Plan 04/07/20: Quad sets, heel slides    Consulted and Agree with Plan of Care Patient           Patient will benefit from skilled therapeutic intervention in order to improve the following deficits and impairments:  Abnormal gait, Pain, Improper body mechanics, Decreased mobility, Decreased activity tolerance, Decreased endurance, Decreased range of motion, Decreased strength, Hypomobility, Decreased balance, Increased edema  Visit Diagnosis: Stiffness of left knee, not elsewhere classified  Muscle weakness (generalized)  Localized edema  Other abnormalities of gait and mobility     Problem List Patient Active  Problem List   Diagnosis Date Noted  . Polyp of hepatic flexure of colon   . Prostate hypertrophy 12/11/2012  . Plantar fasciitis 12/11/2012  . Chronic back pain 12/11/2012   Clarene Critchley PT, DPT 3:48 PM, 04/07/20 [place], [place], [address] Phone: [phone]   Fax:  [phone]  Name: Nashid Pellum MRN: [id number] Date of Birth: 1956/04/09

## 2020-04-19 ENCOUNTER — Ambulatory Visit (HOSPITAL_COMMUNITY): Payer: PRIVATE HEALTH INSURANCE | Attending: Specialist | Admitting: Physical Therapy

## 2020-04-19 ENCOUNTER — Other Ambulatory Visit: Payer: Self-pay

## 2020-04-19 ENCOUNTER — Encounter (HOSPITAL_COMMUNITY): Payer: Self-pay | Admitting: Physical Therapy

## 2020-04-19 DIAGNOSIS — M25662 Stiffness of left knee, not elsewhere classified: Secondary | ICD-10-CM | POA: Insufficient documentation

## 2020-04-19 DIAGNOSIS — M6281 Muscle weakness (generalized): Secondary | ICD-10-CM | POA: Insufficient documentation

## 2020-04-19 DIAGNOSIS — R2689 Other abnormalities of gait and mobility: Secondary | ICD-10-CM | POA: Insufficient documentation

## 2020-04-19 DIAGNOSIS — R600 Localized edema: Secondary | ICD-10-CM | POA: Diagnosis present

## 2020-04-19 NOTE — Therapy (Signed)
[place], [place], [address] Phone: [phone]   Fax:  [phone]  Physical Therapy Treatment  Patient Details  Name: Ricardo Pacheco MRN: [id number] Date of Birth: 1955-12-15 Referring Provider (PT): Sydnee Cabal, MD   Encounter Date: 04/19/2020   PT End of Session - 04/19/20 1742    Visit Number 2    Number of Visits 12    Date for PT Re-Evaluation 05/19/20    Authorization Type [employer]    Authorization - Visit Number 2    Authorization - Number of Visits 60    Progress Note Due on Visit 10    PT Start Time 1062    PT Stop Time 1815    PT Time Calculation (min) 40 min    Equipment Utilized During Treatment --    Activity Tolerance Patient tolerated treatment well    Behavior During Therapy [facility] for tasks assessed/performed           Past Medical History:  Diagnosis Date   Decreased white blood cell count     Past Surgical History:  Procedure Laterality Date   COLONOSCOPY     COLONOSCOPY N/A 07/18/2017   Procedure: COLONOSCOPY;  Surgeon: Danie Binder, MD;  [facility];  Service: Endoscopy;  Laterality: N/A;  2:15 pm   POLYPECTOMY  07/18/2017   Procedure: POLYPECTOMY;  Surgeon: Danie Binder, MD;  [facility];  Service: Endoscopy;;  hepatic flexure    There were no vitals filed for this visit.   Subjective Assessment - 04/19/20 1740    Subjective Patient says he is doing well overall. Says he has "just the normal stiff and soreness". Reports compliance with HEP.    Pertinent History LT knee scope 03/30/20    Limitations Standing;Walking    How long can you sit comfortably? Not limited    How long can you stand comfortably? 5 minutes    How long can you walk comfortably? 5 minutes    Diagnostic tests X-ray LT knee    Patient Stated Goals Get back to where he was and being able to work    Currently in Pain? Yes    Pain Score 5     Pain Location Knee    Pain  Orientation Left    Pain Descriptors / Indicators Aching;Dull;Sore    Pain Type Surgical pain    Pain Onset 1 to 4 weeks ago    Pain Frequency Constant                             [facility] Adult PT Treatment/Exercise - 04/19/20 0001      Exercises   Exercises Knee/Hip      Knee/Hip Exercises: Stretches   Gastroc Stretch Left;3 reps;30 seconds    Gastroc Stretch Limitations with strap       Knee/Hip Exercises: Supine   Quad Sets Left;2 sets;10 reps    Heel Slides Left;15 reps    Bridges Both;2 sets;10 reps    Straight Leg Raises Left;2 sets;10 reps    Knee Extension Left;AROM    Knee Extension Limitations 2    Knee Flexion Left;AROM    Knee Flexion Limitations 111      Knee/Hip Exercises: Sidelying   Hip ABduction Left;2 sets;10 reps      Manual Therapy   Manual Therapy Edema management    Manual therapy comments Manual treatment complete separate from all  other activity     Edema Management edema message to LT knee joint and distal quad for reduced pain and swelling                     PT Short Term Goals - 04/07/20 1532      PT SHORT TERM GOAL #1   Title Patient will report understanding and regular compliance with HEP to improve mobility, strength and improve overall functional mobility.    Time 3    Period Weeks    Status New    Target Date 04/28/20      PT SHORT TERM GOAL #2   Title Patient will demonstrate left knee extension/flexion active range of motion of at least 0-100 degrees to assist with more normalized gait pattern and stair ambulation.    Time 3    Period Weeks    Status New    Target Date 04/28/20             PT Long Term Goals - 04/07/20 1537      PT LONG TERM GOAL #1   Title Patient will perform single limb stance on left/right lower extremity for 10 seconds in order to assist with stair ambulation.    Time 6    Period Weeks    Status New    Target Date 05/19/20      PT LONG TERM GOAL #2   Title Patient  will improve ROM for left knee extension/flexion to 0-120 degrees to improve squatting, and other functional mobility.    Time 6    Period Weeks    Status New    Target Date 05/19/20      PT LONG TERM GOAL #3   Title Patient will demonstrate at least 4+/5 strength in all tested muscle groups to improve overall functional strength and mobility.    Time 6    Period Weeks    Status New    Target Date 05/19/20      PT LONG TERM GOAL #4   Title Patient will demonstrate ability to ambulate at least 1.0 m/s on 2MWT with minimal to no gait deviations for improved ambulation at work.    Time 6    Period Weeks    Status New    Target Date 05/19/20                 Plan - 04/19/20 1824    Clinical Impression Statement Patient tolerated session well today with no increased complaint of pain. Reviewed goals and HEP. Initiated ther ex program this date. Progressed table exercises for hip and knee strengthening as well as knee AROM mobility. Patient educated on proper form and function of all added exercise. Added manual edema message for pain and swelling. Patient educated on and issued updated HEP handout.    Personal Factors and Comorbidities Age    Examination-Activity Limitations Stand;Locomotion Level;Squat;Stairs    Examination-Participation Restrictions Yard Work;Cleaning;Meal Prep;Community Activity    Stability/Clinical Decision Making Stable/Uncomplicated    Rehab Potential Good    PT Frequency 2x / week    PT Duration 6 weeks    PT Treatment/Interventions ADLs/Self Care Home Management;Aquatic Therapy;Cryotherapy;DME Instruction;Electrical Stimulation;Traction;Moist Heat;Gait training;Stair training;Functional mobility training;Therapeutic activities;Therapeutic exercise;Balance training;Neuromuscular re-education;Patient/family education;Orthotic Fit/Training;Manual techniques;Passive range of motion;Dry needling;Taping    PT Next Visit Plan Focus on AROM initially and edema  reduction. Gait training. Progress to strengthening and balance.    PT Home Exercise Plan 04/07/20: Quad sets, heel slides 04/19/20: sidelying  hip abduction, calf stretch with strap    Consulted and Agree with Plan of Care Patient           Patient will benefit from skilled therapeutic intervention in order to improve the following deficits and impairments:  Abnormal gait, Pain, Improper body mechanics, Decreased mobility, Decreased activity tolerance, Decreased endurance, Decreased range of motion, Decreased strength, Hypomobility, Decreased balance, Increased edema  Visit Diagnosis: Stiffness of left knee, not elsewhere classified  Muscle weakness (generalized)  Localized edema  Other abnormalities of gait and mobility     Problem List Patient Active Problem List   Diagnosis Date Noted   Polyp of hepatic flexure of colon    Prostate hypertrophy 12/11/2012   Plantar fasciitis 12/11/2012   Chronic back pain 12/11/2012    6:30 PM, 04/19/20 Josue Hector PT DPT  Physical Therapist with [facility]  [phone] [place], [place], [address] Phone: [phone]   Fax:  [phone]  Name: Ricardo Pacheco MRN: [id number] Date of Birth: 08-May-1956

## 2020-04-21 ENCOUNTER — Other Ambulatory Visit: Payer: Self-pay

## 2020-04-21 ENCOUNTER — Ambulatory Visit (HOSPITAL_COMMUNITY): Payer: PRIVATE HEALTH INSURANCE

## 2020-04-21 ENCOUNTER — Encounter (HOSPITAL_COMMUNITY): Payer: Self-pay

## 2020-04-21 DIAGNOSIS — R600 Localized edema: Secondary | ICD-10-CM

## 2020-04-21 DIAGNOSIS — M25662 Stiffness of left knee, not elsewhere classified: Secondary | ICD-10-CM

## 2020-04-21 DIAGNOSIS — R2689 Other abnormalities of gait and mobility: Secondary | ICD-10-CM

## 2020-04-21 DIAGNOSIS — M6281 Muscle weakness (generalized): Secondary | ICD-10-CM

## 2020-04-21 NOTE — Therapy (Signed)
[place], [place], [address] Phone: [phone]   Fax:  [phone]  Physical Therapy Treatment  Patient Details  Name: Ricardo Pacheco MRN: [id number] Date of Birth: 1955/11/02 Referring Provider (PT): Sydnee Cabal, MD   Encounter Date: 04/21/2020   PT End of Session - 04/21/20 1634    Visit Number 3    Number of Visits 12    Date for PT Re-Evaluation 05/19/20    Authorization Type [employer]    Authorization - Visit Number 3    Authorization - Number of Visits 60    Progress Note Due on Visit 10    PT Start Time 1618   3' on bike, not included with charges   PT Stop Time 1700    PT Time Calculation (min) 42 min    Activity Tolerance Patient tolerated treatment well    Behavior During Therapy [facility] for tasks assessed/performed           Past Medical History:  Diagnosis Date   Decreased white blood cell count     Past Surgical History:  Procedure Laterality Date   COLONOSCOPY     COLONOSCOPY N/A 07/18/2017   Procedure: COLONOSCOPY;  Surgeon: Danie Binder, MD;  [facility];  Service: Endoscopy;  Laterality: N/A;  2:15 pm   POLYPECTOMY  07/18/2017   Procedure: POLYPECTOMY;  Surgeon: Danie Binder, MD;  [facility];  Service: Endoscopy;;  hepatic flexure    There were no vitals filed for this visit.   Subjective Assessment - 04/21/20 1621    Subjective Pt stated he is doing good today.  Reports compliance wiht HEP daily.  Pain scale range from 3-5/10 depending on activity.    Pertinent History LT knee scope 03/30/20    Patient Stated Goals Get back to where he was and being able to work    Currently in Pain? Yes    Pain Score 4     Pain Location Knee    Pain Orientation Left    Pain Descriptors / Indicators Aching;Sore    Pain Type Surgical pain    Pain Onset 1 to 4 weeks ago    Pain Frequency Constant    Aggravating Factors  Laying on the left side    Pain Relieving  Factors icing his knee    Effect of Pain on Daily Activities limits              [facility] PT Assessment - 04/21/20 0001      Assessment   Medical Diagnosis Left knee scope    Referring Provider (PT) Sydnee Cabal, MD    Onset Date/Surgical Date 03/30/20    Next MD Visit 05/12/20    Prior Therapy Yes, for back      Precautions   Precautions None                         [facility] Adult PT Treatment/Exercise - 04/21/20 0001      Exercises   Exercises Knee/Hip      Knee/Hip Exercises: Stretches   Quad Stretch 3 reps;30 seconds    Quad Stretch Limitations prone with rope    Knee: Self-Stretch to increase Flexion Left;5 reps;10 seconds    Knee: Self-Stretch Limitations knee drive on 12    Gastroc Stretch Left;3 reps;30 seconds    Gastroc Stretch Limitations slant board      Knee/Hip Exercises: Aerobic   Recumbent Bike  full revolution seat 14 x 3 min for mobility      Knee/Hip Exercises: Standing   Heel Raises 10 reps;3 seconds    Heel Raises Limitations incline slope    Terminal Knee Extension 10 reps;Theraband    Theraband Level (Terminal Knee Extension) Other (comment)   Purple   Terminal Knee Extension Limitations 10" holds      Knee/Hip Exercises: Seated   Sit to Sand 10 reps;without UE support   eccentric control     Knee/Hip Exercises: Supine   Short Arc Quad Sets 10 reps    Bridges Both;2 sets;10 reps    Knee Extension Left;AROM    Knee Extension Limitations 1    Knee Flexion Left;AROM    Knee Flexion Limitations 121      Knee/Hip Exercises: Prone   Hamstring Curl 10 reps      Manual Therapy   Manual Therapy Edema management    Manual therapy comments Manual treatment complete separate from all other activity     Edema Management Retrograde massage with LE elevated                    PT Short Term Goals - 04/07/20 1532      PT SHORT TERM GOAL #1   Title Patient will report understanding and regular compliance with HEP to improve  mobility, strength and improve overall functional mobility.    Time 3    Period Weeks    Status New    Target Date 04/28/20      PT SHORT TERM GOAL #2   Title Patient will demonstrate left knee extension/flexion active range of motion of at least 0-100 degrees to assist with more normalized gait pattern and stair ambulation.    Time 3    Period Weeks    Status New    Target Date 04/28/20             PT Long Term Goals - 04/07/20 1537      PT LONG TERM GOAL #1   Title Patient will perform single limb stance on left/right lower extremity for 10 seconds in order to assist with stair ambulation.    Time 6    Period Weeks    Status New    Target Date 05/19/20      PT LONG TERM GOAL #2   Title Patient will improve ROM for left knee extension/flexion to 0-120 degrees to improve squatting, and other functional mobility.    Time 6    Period Weeks    Status New    Target Date 05/19/20      PT LONG TERM GOAL #3   Title Patient will demonstrate at least 4+/5 strength in all tested muscle groups to improve overall functional strength and mobility.    Time 6    Period Weeks    Status New    Target Date 05/19/20      PT LONG TERM GOAL #4   Title Patient will demonstrate ability to ambulate at least 1.0 m/s on 2MWT with minimal to no gait deviations for improved ambulation at work.    Time 6    Period Weeks    Status New    Target Date 05/19/20                 Plan - 04/21/20 1636    Clinical Impression Statement Pt is progressing well towards POC.  Session focus on knee mobility and quad/gluteal strengthening.  Added standing stretches  and gait associated strengthening exercises.  Pt able to make full revolution with bike (seat 14).  Manual retrograde massage complete to address edema proximal knee.  AROM improving 1-121 degrees.    Personal Factors and Comorbidities Age    Examination-Activity Limitations Stand;Locomotion Level;Squat;Stairs    Examination-Participation  Restrictions Yard Work;Cleaning;Meal Prep;Community Activity    Stability/Clinical Decision Making Stable/Uncomplicated    Clinical Decision Making Low    Rehab Potential Good    PT Frequency 2x / week    PT Duration 6 weeks    PT Treatment/Interventions ADLs/Self Care Home Management;Aquatic Therapy;Cryotherapy;DME Instruction;Electrical Stimulation;Traction;Moist Heat;Gait training;Stair training;Functional mobility training;Therapeutic activities;Therapeutic exercise;Balance training;Neuromuscular re-education;Patient/family education;Orthotic Fit/Training;Manual techniques;Passive range of motion;Dry needling;Taping    PT Next Visit Plan Progress to strengthening and balance as AROM 1-121.  Manual for edema control PRN.    PT Home Exercise Plan 04/07/20: Quad sets, heel slides 04/19/20: sidelying hip abduction, calf stretch with strap           Patient will benefit from skilled therapeutic intervention in order to improve the following deficits and impairments:  Abnormal gait, Pain, Improper body mechanics, Decreased mobility, Decreased activity tolerance, Decreased endurance, Decreased range of motion, Decreased strength, Hypomobility, Decreased balance, Increased edema  Visit Diagnosis: Stiffness of left knee, not elsewhere classified  Muscle weakness (generalized)  Localized edema  Other abnormalities of gait and mobility     Problem List Patient Active Problem List   Diagnosis Date Noted   Polyp of hepatic flexure of colon    Prostate hypertrophy 12/11/2012   Plantar fasciitis 12/11/2012   Chronic back pain 12/11/2012   Ihor Austin, LPTA/CLT; CBIS [phone]  Aldona Lento 04/21/2020, 5:50 PM  [facility] [address] [place], [place], [address] Phone: [phone]   Fax:  [phone]  Name: Ricardo Pacheco MRN: [id number] Date of Birth: [birth year]

## 2020-04-24 ENCOUNTER — Ambulatory Visit (HOSPITAL_COMMUNITY): Payer: PRIVATE HEALTH INSURANCE | Admitting: Physical Therapy

## 2020-04-24 ENCOUNTER — Other Ambulatory Visit: Payer: Self-pay

## 2020-04-24 DIAGNOSIS — M25662 Stiffness of left knee, not elsewhere classified: Secondary | ICD-10-CM | POA: Diagnosis not present

## 2020-04-24 DIAGNOSIS — R2689 Other abnormalities of gait and mobility: Secondary | ICD-10-CM

## 2020-04-24 DIAGNOSIS — M6281 Muscle weakness (generalized): Secondary | ICD-10-CM

## 2020-04-24 DIAGNOSIS — R600 Localized edema: Secondary | ICD-10-CM

## 2020-04-24 NOTE — Therapy (Signed)
[place], [place], [address] Phone: [phone]   Fax:  [phone]  Physical Therapy Treatment  Patient Details  Name: Ricardo Pacheco MRN: [id number] Date of Birth: 1955-11-13 Referring Provider (PT): Sydnee Cabal, MD   Encounter Date: 04/24/2020   PT End of Session - 04/24/20 1618    Visit Number 4    Number of Visits 12    Date for PT Re-Evaluation 05/19/20    Authorization Type [employer]    Authorization - Visit Number 4    Authorization - Number of Visits 60    Progress Note Due on Visit 10    PT Start Time 2355    PT Stop Time 1615    PT Time Calculation (min) 45 min    Activity Tolerance Patient tolerated treatment well    Behavior During Therapy [facility] for tasks assessed/performed           Past Medical History:  Diagnosis Date  . Decreased white blood cell count     Past Surgical History:  Procedure Laterality Date  . COLONOSCOPY    . COLONOSCOPY N/A 07/18/2017   Procedure: COLONOSCOPY;  Surgeon: Danie Binder, MD;  [facility];  Service: Endoscopy;  Laterality: N/A;  2:15 pm  . POLYPECTOMY  07/18/2017   Procedure: POLYPECTOMY;  Surgeon: Danie Binder, MD;  [facility];  Service: Endoscopy;;  hepatic flexure    There were no vitals filed for this visit.   Subjective Assessment - 04/24/20 1556    Subjective pt states the only thing that is bothering him is the stiffness in the morning and prolonged standing causes aching and weakness.    Currently in Pain? Yes    Pain Score 3     Pain Location Knee    Pain Orientation Left                             [facility] Adult PT Treatment/Exercise - 04/24/20 0001      Knee/Hip Exercises: Stretches   Gastroc Stretch Left;3 reps;30 seconds    Gastroc Stretch Limitations slant board      Knee/Hip Exercises: Aerobic   Recumbent Bike full revolution seat 14 x 3 min for mobility      Knee/Hip Exercises:  Standing   Heel Raises 3 seconds;15 reps    Heel Raises Limitations incline slope    Forward Lunges Right;Left;15 reps;Limitations    Forward Lunges Limitations onto 4" box without UE assist    Lateral Step Up Left;15 reps;Hand Hold: 0;Step Height: 4"    Lateral Step Up Limitations eccentric control    Step Down Left;10 reps;Step Height: 4";Hand Hold: 0;Limitations    Step Down Limitations eccentric control    Stairs 7" with 1 HR reciprocally 5RT    SLS 30" each LE     Other Standing Knee Exercises tandem stance 30" X 2 each      Knee/Hip Exercises: Seated   Sit to Sand 10 reps;without UE support      Manual Therapy   Manual Therapy Edema management    Manual therapy comments Manual treatment complete separate from all other activity     Edema Management Retrograde massage with LE elevated                    PT Short Term Goals - 04/07/20 1532      PT SHORT TERM GOAL #  1   Title Patient will report understanding and regular compliance with HEP to improve mobility, strength and improve overall functional mobility.    Time 3    Period Weeks    Status New    Target Date 04/28/20      PT SHORT TERM GOAL #2   Title Patient will demonstrate left knee extension/flexion active range of motion of at least 0-100 degrees to assist with more normalized gait pattern and stair ambulation.    Time 3    Period Weeks    Status New    Target Date 04/28/20             PT Long Term Goals - 04/07/20 1537      PT LONG TERM GOAL #1   Title Patient will perform single limb stance on left/right lower extremity for 10 seconds in order to assist with stair ambulation.    Time 6    Period Weeks    Status New    Target Date 05/19/20      PT LONG TERM GOAL #2   Title Patient will improve ROM for left knee extension/flexion to 0-120 degrees to improve squatting, and other functional mobility.    Time 6    Period Weeks    Status New    Target Date 05/19/20      PT LONG TERM GOAL  #3   Title Patient will demonstrate at least 4+/5 strength in all tested muscle groups to improve overall functional strength and mobility.    Time 6    Period Weeks    Status New    Target Date 05/19/20      PT LONG TERM GOAL #4   Title Patient will demonstrate ability to ambulate at least 1.0 m/s on 2MWT with minimal to no gait deviations for improved ambulation at work.    Time 6    Period Weeks    Status New    Target Date 05/19/20                 Plan - 04/24/20 1619    Clinical Impression Statement Pt with normal ROM at this point so will cointinue focus on improving LE strength and actvitiy tolerance.  Added step downs both forward and lateral with cues for eccentric control.  pt able to negotiate stairs reciprocally this session without antalgia or noted deviations. Continued with retro massage at EOS to help reduce edema.  Suggested compression to use if edema continues to be an issue.    Personal Factors and Comorbidities Age    Examination-Activity Limitations Stand;Locomotion Level;Squat;Stairs    Examination-Participation Restrictions Yard Work;Cleaning;Meal Prep;Community Activity    Stability/Clinical Decision Making Stable/Uncomplicated    Rehab Potential Good    PT Frequency 2x / week    PT Duration 6 weeks    PT Treatment/Interventions ADLs/Self Care Home Management;Aquatic Therapy;Cryotherapy;DME Instruction;Electrical Stimulation;Traction;Moist Heat;Gait training;Stair training;Functional mobility training;Therapeutic activities;Therapeutic exercise;Balance training;Neuromuscular re-education;Patient/family education;Orthotic Fit/Training;Manual techniques;Passive range of motion;Dry needling;Taping    PT Next Visit Plan Progress to strengthening and balance as AROM 1-121.  Manual for edema control PRN.    PT Home Exercise Plan 04/07/20: Quad sets, heel slides 04/19/20: sidelying hip abduction, calf stretch with strap           Patient will benefit from  skilled therapeutic intervention in order to improve the following deficits and impairments:  Abnormal gait, Pain, Improper body mechanics, Decreased mobility, Decreased activity tolerance, Decreased endurance, Decreased range of motion, Decreased strength, Hypomobility,  Decreased balance, Increased edema  Visit Diagnosis: Stiffness of left knee, not elsewhere classified  Muscle weakness (generalized)  Localized edema  Other abnormalities of gait and mobility     Problem List Patient Active Problem List   Diagnosis Date Noted  . Polyp of hepatic flexure of colon   . Prostate hypertrophy 12/11/2012  . Plantar fasciitis 12/11/2012  . Chronic back pain 12/11/2012   Teena Irani, PTA/CLT [phone]  Teena Irani 04/24/2020, 4:21 PM  [facility] [address] [place], [place], [address] Phone: [phone]   Fax:  [phone]  Name: Ricardo Pacheco MRN: [id number] Date of Birth: Feb 03, 1956

## 2020-04-26 ENCOUNTER — Other Ambulatory Visit: Payer: Self-pay

## 2020-04-26 ENCOUNTER — Encounter (HOSPITAL_COMMUNITY): Payer: Self-pay | Admitting: Physical Therapy

## 2020-04-26 ENCOUNTER — Ambulatory Visit (HOSPITAL_COMMUNITY): Payer: PRIVATE HEALTH INSURANCE | Admitting: Physical Therapy

## 2020-04-26 DIAGNOSIS — R600 Localized edema: Secondary | ICD-10-CM

## 2020-04-26 DIAGNOSIS — R2689 Other abnormalities of gait and mobility: Secondary | ICD-10-CM

## 2020-04-26 DIAGNOSIS — M6281 Muscle weakness (generalized): Secondary | ICD-10-CM

## 2020-04-26 DIAGNOSIS — M25662 Stiffness of left knee, not elsewhere classified: Secondary | ICD-10-CM | POA: Diagnosis not present

## 2020-04-26 NOTE — Therapy (Signed)
[place], [place], [address] Phone: [phone]   Fax:  [phone]  Physical Therapy Treatment  Patient Details  Name: Ricardo Pacheco MRN: [id number] Date of Birth: 06/20/1956 Referring Provider (PT): Sydnee Cabal, MD   Encounter Date: 04/26/2020   PT End of Session - 04/26/20 1514    Visit Number 5    Number of Visits 12    Date for PT Re-Evaluation 05/19/20    Authorization Type [employer]    Authorization - Visit Number 5    Authorization - Number of Visits 60    Progress Note Due on Visit 10    PT Start Time 5188    PT Stop Time 1555    PT Time Calculation (min) 40 min    Activity Tolerance Patient tolerated treatment well    Behavior During Therapy [facility] for tasks assessed/performed           Past Medical History:  Diagnosis Date  . Decreased white blood cell count     Past Surgical History:  Procedure Laterality Date  . COLONOSCOPY    . COLONOSCOPY N/A 07/18/2017   Procedure: COLONOSCOPY;  Surgeon: Danie Binder, MD;  [facility];  Service: Endoscopy;  Laterality: N/A;  2:15 pm  . POLYPECTOMY  07/18/2017   Procedure: POLYPECTOMY;  Surgeon: Danie Binder, MD;  [facility];  Service: Endoscopy;;  hepatic flexure    There were no vitals filed for this visit.   Subjective Assessment - 04/26/20 1515    Subjective Patient states stiffness and some pain today. He states his knee is getting better. His exercises are going well.    Pain Score 4     Pain Location Knee    Pain Orientation Left                             [facility] Adult PT Treatment/Exercise - 04/26/20 0001      Knee/Hip Exercises: Aerobic   Recumbent Bike full revolution seat 14 x 4 min for mobility and dynamic warm up      Knee/Hip Exercises: Standing   Heel Raises 2 sets;10 reps    Heel Raises Limitations slant board    Knee Flexion 2 sets;20 reps    Knee Flexion Limitations 3#  hamstring curls    Forward Lunges 10 reps;Both;3 sets    Forward Lunges Limitations without UE assist    Side Lunges 2 sets;15 reps;Both    Lateral Step Up 2 sets;Hand Hold: 1;Step Height: 6";15 reps    Lateral Step Up Limitations eccentric control    Forward Step Up 1 set;10 reps;Hand Hold: 0;Step Height: 6"    Step Down Left;2 sets;Hand Hold: 0;Step Height: 6";15 reps    Step Down Limitations eccentric control    Functional Squat 2 sets;10 reps    SLS with Vectors 5x5 second holds without UE support                  PT Education - 04/26/20 1513    Education Details Patient educated on HEP, exercise mechanics    Person(s) Educated Patient    Methods Explanation;Demonstration    Comprehension Verbalized understanding;Returned demonstration            PT Short Term Goals - 04/26/20 1519      PT SHORT TERM GOAL #1   Title Patient will report understanding and regular compliance with HEP to  improve mobility, strength and improve overall functional mobility.    Time 3    Period Weeks    Status On-going    Target Date 04/28/20      PT SHORT TERM GOAL #2   Title Patient will demonstrate left knee extension/flexion active range of motion of at least 0-100 degrees to assist with more normalized gait pattern and stair ambulation.    Time 3    Period Weeks    Status On-going    Target Date 04/28/20             PT Long Term Goals - 04/26/20 1519      PT LONG TERM GOAL #1   Title Patient will perform single limb stance on left/right lower extremity for 10 seconds in order to assist with stair ambulation.    Time 6    Period Weeks    Status On-going      PT LONG TERM GOAL #2   Title Patient will improve ROM for left knee extension/flexion to 0-120 degrees to improve squatting, and other functional mobility.    Time 6    Period Weeks    Status On-going      PT LONG TERM GOAL #3   Title Patient will demonstrate at least 4+/5 strength in all tested muscle groups to  improve overall functional strength and mobility.    Time 6    Period Weeks    Status On-going      PT LONG TERM GOAL #4   Title Patient will demonstrate ability to ambulate at least 1.0 m/s on 2MWT with minimal to no gait deviations for improved ambulation at work.    Time 6    Period Weeks    Status On-going                 Plan - 04/26/20 1514    Clinical Impression Statement Patient able to complete calf raises on slant board with emphasis on eccentric control for calf strengthen in increased range. Patient completes lateral step down with good eccentric motor control from increased height today and is able to complete increased reps. Patient showing L dynamic knee valgus with forward step down secondary to impaired hip abductor strength. Patient requires intermittent UE support with SLS with vectors due to impaired balance LLE strength. Patient able to complete lunges with good mechanics following initial demonstration. Patient given cueing to decrease squat depth but is able to complete with good mechanics. Patient tolerates session well and is educated on muscle soreness following exercise. Patient will continue to benefit from skilled physical therapy in order to reduce impairment and improve function.    Personal Factors and Comorbidities Age    Examination-Activity Limitations Stand;Locomotion Level;Squat;Stairs    Examination-Participation Restrictions Yard Work;Cleaning;Meal Prep;Community Activity    Stability/Clinical Decision Making Stable/Uncomplicated    Rehab Potential Good    PT Frequency 2x / week    PT Duration 6 weeks    PT Treatment/Interventions ADLs/Self Care Home Management;Aquatic Therapy;Cryotherapy;DME Instruction;Electrical Stimulation;Traction;Moist Heat;Gait training;Stair training;Functional mobility training;Therapeutic activities;Therapeutic exercise;Balance training;Neuromuscular re-education;Patient/family education;Orthotic Fit/Training;Manual  techniques;Passive range of motion;Dry needling;Taping    PT Next Visit Plan Progress to strengthening and balance as AROM 1-121.  Manual for edema control PRN.    PT Home Exercise Plan 04/07/20: Quad sets, heel slides 04/19/20: sidelying hip abduction, calf stretch with strap 8/11 squat, lateral lunge, sls with vectors           Patient will benefit from skilled therapeutic intervention in  order to improve the following deficits and impairments:  Abnormal gait, Pain, Improper body mechanics, Decreased mobility, Decreased activity tolerance, Decreased endurance, Decreased range of motion, Decreased strength, Hypomobility, Decreased balance, Increased edema  Visit Diagnosis: Stiffness of left knee, not elsewhere classified  Muscle weakness (generalized)  Localized edema  Other abnormalities of gait and mobility     Problem List Patient Active Problem List   Diagnosis Date Noted  . Polyp of hepatic flexure of colon   . Prostate hypertrophy 12/11/2012  . Plantar fasciitis 12/11/2012  . Chronic back pain 12/11/2012    3:57 PM, 04/26/20 Mearl Latin PT, DPT Physical Therapist at [place], [place], [address] Phone: [phone]   Fax:  [phone]  Name: Benoit Meech MRN: [id number] Date of Birth: [birth year]

## 2020-04-26 NOTE — Patient Instructions (Signed)
Access Code: [email or web address] URL: [email or web address] Date: 04/26/2020 Prepared by: Mitzi Hansen Kerrick Miler  Exercises Single Leg Balance with Clock Reach - 1 x daily - 7 x weekly - 5 reps - 5 second holds hold Squat - 1 x daily - 7 x weekly - 2 sets - 10 reps Upright Side Lunge - 1 x daily - 7 x weekly - 2 sets - 15 reps

## 2020-04-28 ENCOUNTER — Telehealth: Payer: Self-pay | Admitting: Family Medicine

## 2020-04-28 NOTE — Telephone Encounter (Signed)
Left message with receptionist to send message to carol knowles nurse and have her call us back.

## 2020-05-02 ENCOUNTER — Ambulatory Visit (HOSPITAL_COMMUNITY): Payer: PRIVATE HEALTH INSURANCE | Admitting: Physical Therapy

## 2020-05-02 ENCOUNTER — Other Ambulatory Visit: Payer: Self-pay

## 2020-05-02 ENCOUNTER — Encounter (HOSPITAL_COMMUNITY): Payer: Self-pay | Admitting: Physical Therapy

## 2020-05-02 DIAGNOSIS — M25662 Stiffness of left knee, not elsewhere classified: Secondary | ICD-10-CM

## 2020-05-02 DIAGNOSIS — R2689 Other abnormalities of gait and mobility: Secondary | ICD-10-CM

## 2020-05-02 DIAGNOSIS — R600 Localized edema: Secondary | ICD-10-CM

## 2020-05-02 DIAGNOSIS — M6281 Muscle weakness (generalized): Secondary | ICD-10-CM

## 2020-05-02 NOTE — Therapy (Signed)
[place], [place], [address] Phone: [phone]   Fax:  [phone]  Physical Therapy Treatment  Patient Details  Name: Ricardo Pacheco MRN: [id number] Date of Birth: 04/06/1956 Referring Provider (PT): Sydnee Cabal, MD   Encounter Date: 05/02/2020   PT End of Session - 05/02/20 1450    Visit Number 6    Number of Visits 12    Date for PT Re-Evaluation 05/19/20    Authorization Type [employer]    Authorization - Visit Number 6    Authorization - Number of Visits 60    Progress Note Due on Visit 10    PT Start Time 8101    PT Stop Time 1530    PT Time Calculation (min) 45 min    Activity Tolerance Patient tolerated treatment well    Behavior During Therapy [facility] for tasks assessed/performed           Past Medical History:  Diagnosis Date  . Decreased white blood cell count     Past Surgical History:  Procedure Laterality Date  . COLONOSCOPY    . COLONOSCOPY N/A 07/18/2017   Procedure: COLONOSCOPY;  Surgeon: Danie Binder, MD;  [facility];  Service: Endoscopy;  Laterality: N/A;  2:15 pm  . POLYPECTOMY  07/18/2017   Procedure: POLYPECTOMY;  Surgeon: Danie Binder, MD;  [facility];  Service: Endoscopy;;  hepatic flexure    There were no vitals filed for this visit.   Subjective Assessment - 05/02/20 1449    Subjective States he has current pain between 5-6/10 off and on and that it is a bit stiff and sore along the front of the knee.    Currently in Pain? Yes    Pain Score 5     Pain Location Knee    Pain Orientation Left;Anterior    Pain Descriptors / Indicators Aching;Tightness;Sore              [facility] PT Assessment - 05/02/20 0001      Assessment   Medical Diagnosis Left knee scope    Referring Provider (PT) Sydnee Cabal, MD    Onset Date/Surgical Date 03/30/20    Next MD Visit 05/12/20                         [facility] Adult PT Treatment/Exercise -  05/02/20 0001      Knee/Hip Exercises: Aerobic   Recumbent Bike full revolution level 3, 5 minutes       Knee/Hip Exercises: Standing   Rocker Board Limitations lateral and forward - controlled 3x10 E and bilateral     Other Standing Knee Exercises steps 6" no UE support L up and right down slow descent 3x10       Knee/Hip Exercises: Supine   Other Supine Knee/Hip Exercises patella mobilization L x10 10" holds in inferior direction       Knee/Hip Exercises: Prone   Hamstring Curl 3 sets;5 reps;Other (comment)   10 second holds L      Manual Therapy   Manual Therapy Soft tissue mobilization    Manual therapy comments Manual treatment complete separate from all other activity     Soft tissue mobilization STM to anterior knee - tolerated well                     PT Short Term Goals - 04/26/20 1519      PT SHORT  TERM GOAL #1   Title Patient will report understanding and regular compliance with HEP to improve mobility, strength and improve overall functional mobility.    Time 3    Period Weeks    Status On-going    Target Date 04/28/20      PT SHORT TERM GOAL #2   Title Patient will demonstrate left knee extension/flexion active range of motion of at least 0-100 degrees to assist with more normalized gait pattern and stair ambulation.    Time 3    Period Weeks    Status On-going    Target Date 04/28/20             PT Long Term Goals - 04/26/20 1519      PT LONG TERM GOAL #1   Title Patient will perform single limb stance on left/right lower extremity for 10 seconds in order to assist with stair ambulation.    Time 6    Period Weeks    Status On-going      PT LONG TERM GOAL #2   Title Patient will improve ROM for left knee extension/flexion to 0-120 degrees to improve squatting, and other functional mobility.    Time 6    Period Weeks    Status On-going      PT LONG TERM GOAL #3   Title Patient will demonstrate at least 4+/5 strength in all tested muscle  groups to improve overall functional strength and mobility.    Time 6    Period Weeks    Status On-going      PT LONG TERM GOAL #4   Title Patient will demonstrate ability to ambulate at least 1.0 m/s on 2MWT with minimal to no gait deviations for improved ambulation at work.    Time 6    Period Weeks    Status On-going                 Plan - 05/02/20 1525    Clinical Impression Statement Patient doing well, most concerned with his ability to kneel on his knees secondary to needing to be able to do this for his job. Educated patient on progress made so far and working on mobility prior to kneeling. Added prone exercises to work towards position required for kneeling. Decreased stiffness noted end of session.    Personal Factors and Comorbidities Age    Examination-Activity Limitations Stand;Locomotion Level;Squat;Stairs    Examination-Participation Restrictions Yard Work;Cleaning;Meal Prep;Community Activity    Stability/Clinical Decision Making Stable/Uncomplicated    Rehab Potential Good    PT Frequency 2x / week    PT Duration 6 weeks    PT Treatment/Interventions ADLs/Self Care Home Management;Aquatic Therapy;Cryotherapy;DME Instruction;Electrical Stimulation;Traction;Moist Heat;Gait training;Stair training;Functional mobility training;Therapeutic activities;Therapeutic exercise;Balance training;Neuromuscular re-education;Patient/family education;Orthotic Fit/Training;Manual techniques;Passive range of motion;Dry needling;Taping    PT Next Visit Plan work on quad lengthening, Progress to strengthening and balance as AROM 1-121.  Manual for edema control PRN.    PT Home Exercise Plan 04/07/20: Quad sets, heel slides 04/19/20: sidelying hip abduction, calf stretch with strap 8/11 squat, lateral lunge, sls with vectors; 8/17 inferior mobilizaiton to patella, hamstring curl prone           Patient will benefit from skilled therapeutic intervention in order to improve the following  deficits and impairments:  Abnormal gait, Pain, Improper body mechanics, Decreased mobility, Decreased activity tolerance, Decreased endurance, Decreased range of motion, Decreased strength, Hypomobility, Decreased balance, Increased edema  Visit Diagnosis: Stiffness of left knee, not elsewhere classified  Muscle weakness (generalized)  Localized edema  Other abnormalities of gait and mobility     Problem List Patient Active Problem List   Diagnosis Date Noted  . Polyp of hepatic flexure of colon   . Prostate hypertrophy 12/11/2012  . Plantar fasciitis 12/11/2012  . Chronic back pain 12/11/2012    .3:34 PM, 05/02/20 Jerene Pitch, DPT Physical Therapy with [facility]  [phone] office  [place] [address] [place], [place], [address] Phone: [phone]   Fax:  [phone]  Name: Ricardo Pacheco MRN: [id number] Date of Birth: 02-13-1956

## 2020-05-03 NOTE — Telephone Encounter (Signed)
Called [facility] back since I did not get a call back and nurse did send the message

## 2020-05-04 ENCOUNTER — Encounter (HOSPITAL_COMMUNITY): Payer: Self-pay | Admitting: Physical Therapy

## 2020-05-04 ENCOUNTER — Other Ambulatory Visit: Payer: Self-pay

## 2020-05-04 ENCOUNTER — Ambulatory Visit (HOSPITAL_COMMUNITY): Payer: PRIVATE HEALTH INSURANCE | Admitting: Physical Therapy

## 2020-05-04 DIAGNOSIS — M25662 Stiffness of left knee, not elsewhere classified: Secondary | ICD-10-CM | POA: Diagnosis not present

## 2020-05-04 DIAGNOSIS — M6281 Muscle weakness (generalized): Secondary | ICD-10-CM

## 2020-05-04 DIAGNOSIS — R2689 Other abnormalities of gait and mobility: Secondary | ICD-10-CM

## 2020-05-04 DIAGNOSIS — R600 Localized edema: Secondary | ICD-10-CM

## 2020-05-04 NOTE — Therapy (Signed)
[place], [place], [address] Phone: [phone]   Fax:  [phone]  Physical Therapy Treatment  Patient Details  Name: Ricardo Pacheco MRN: [id number] Date of Birth: 01-Apr-1956 Referring Provider (PT): Sydnee Cabal, MD   Encounter Date: 05/04/2020   PT End of Session - 05/04/20 1656    Visit Number 7    Number of Visits 12    Date for PT Re-Evaluation 05/19/20    Authorization Type [employer]    Authorization - Visit Number 7    Authorization - Number of Visits 60    Progress Note Due on Visit 10    PT Start Time 1694    PT Stop Time 1728    PT Time Calculation (min) 38 min    Activity Tolerance Patient tolerated treatment well    Behavior During Therapy [facility] for tasks assessed/performed           Past Medical History:  Diagnosis Date  . Decreased white blood cell count     Past Surgical History:  Procedure Laterality Date  . COLONOSCOPY    . COLONOSCOPY N/A 07/18/2017   Procedure: COLONOSCOPY;  Surgeon: Danie Binder, MD;  [facility];  Service: Endoscopy;  Laterality: N/A;  2:15 pm  . POLYPECTOMY  07/18/2017   Procedure: POLYPECTOMY;  Surgeon: Danie Binder, MD;  [facility];  Service: Endoscopy;;  hepatic flexure    There were no vitals filed for this visit.   Subjective Assessment - 05/04/20 1651    Subjective Patient reported that his knee pain is between a 3 and 4.    Currently in Pain? Yes    Pain Score 4     Pain Location Knee    Pain Orientation Left;Anterior    Pain Descriptors / Indicators Aching    Pain Type Surgical pain                             [facility] Adult PT Treatment/Exercise - 05/04/20 0001      Knee/Hip Exercises: Standing   Heel Raises 2 sets;10 reps    Functional Squat 2 sets;10 reps    Rocker Board Limitations RT/LT and A/P 3x10. Holding in the middle x1 minute each direction.     SLS with Vectors 5x5 second holds without  UE support    Other Standing Knee Exercises steps 6" no UE support L up and right down slow descent 3x10       Knee/Hip Exercises: Supine   Straight Leg Raises Left;10 reps;3 sets    Other Supine Knee/Hip Exercises SLS balance on each LE 5x on foam      Knee/Hip Exercises: Prone   Hamstring Curl 3 sets;5 reps;Other (comment)   10'' holds                   PT Short Term Goals - 04/26/20 1519      PT SHORT TERM GOAL #1   Title Patient will report understanding and regular compliance with HEP to improve mobility, strength and improve overall functional mobility.    Time 3    Period Weeks    Status On-going    Target Date 04/28/20      PT SHORT TERM GOAL #2   Title Patient will demonstrate left knee extension/flexion active range of motion of at least 0-100 degrees to assist with more normalized gait pattern and stair ambulation.  Time 3    Period Weeks    Status On-going    Target Date 04/28/20             PT Long Term Goals - 04/26/20 1519      PT LONG TERM GOAL #1   Title Patient will perform single limb stance on left/right lower extremity for 10 seconds in order to assist with stair ambulation.    Time 6    Period Weeks    Status On-going      PT LONG TERM GOAL #2   Title Patient will improve ROM for left knee extension/flexion to 0-120 degrees to improve squatting, and other functional mobility.    Time 6    Period Weeks    Status On-going      PT LONG TERM GOAL #3   Title Patient will demonstrate at least 4+/5 strength in all tested muscle groups to improve overall functional strength and mobility.    Time 6    Period Weeks    Status On-going      PT LONG TERM GOAL #4   Title Patient will demonstrate ability to ambulate at least 1.0 m/s on 2MWT with minimal to no gait deviations for improved ambulation at work.    Time 6    Period Weeks    Status On-going                 Plan - 05/04/20 1733    Clinical Impression Statement Focused  on balance and strengthening this session. Added SLS balance on foam this session. Patient demonstrated some wobbliness with this, but overall good balance. Patient demonstrated overall good control with squats and step-ups this session. Patient would benefit from continued skilled physical therapy in order to continue progressing towards achieving functional goals.    Personal Factors and Comorbidities Age    Examination-Activity Limitations Stand;Locomotion Level;Squat;Stairs    Examination-Participation Restrictions Yard Work;Cleaning;Meal Prep;Community Activity    Stability/Clinical Decision Making Stable/Uncomplicated    Rehab Potential Good    PT Frequency 2x / week    PT Duration 6 weeks    PT Treatment/Interventions ADLs/Self Care Home Management;Aquatic Therapy;Cryotherapy;DME Instruction;Electrical Stimulation;Traction;Moist Heat;Gait training;Stair training;Functional mobility training;Therapeutic activities;Therapeutic exercise;Balance training;Neuromuscular re-education;Patient/family education;Orthotic Fit/Training;Manual techniques;Passive range of motion;Dry needling;Taping    PT Next Visit Plan work on quad lengthening, Progress to strengthening and balance as AROM 1-121.  Manual for edema control PRN.    PT Home Exercise Plan 04/07/20: Quad sets, heel slides 04/19/20: sidelying hip abduction, calf stretch with strap 8/11 squat, lateral lunge, sls with vectors; 8/17 inferior mobilizaiton to patella, hamstring curl prone           Patient will benefit from skilled therapeutic intervention in order to improve the following deficits and impairments:  Abnormal gait, Pain, Improper body mechanics, Decreased mobility, Decreased activity tolerance, Decreased endurance, Decreased range of motion, Decreased strength, Hypomobility, Decreased balance, Increased edema  Visit Diagnosis: Stiffness of left knee, not elsewhere classified  Muscle weakness (generalized)  Localized edema  Other  abnormalities of gait and mobility     Problem List Patient Active Problem List   Diagnosis Date Noted  . Polyp of hepatic flexure of colon   . Prostate hypertrophy 12/11/2012  . Plantar fasciitis 12/11/2012  . Chronic back pain 12/11/2012   Clarene Critchley PT, DPT 5:36 PM, 05/04/20 [place] [address] [place], [place], [address] Phone: [phone]   Fax:  [phone]  Name: Ricardo Pacheco  MRN: [id number] Date of Birth: Jan 01, 1956

## 2020-05-09 ENCOUNTER — Ambulatory Visit (HOSPITAL_COMMUNITY): Payer: PRIVATE HEALTH INSURANCE

## 2020-05-09 ENCOUNTER — Other Ambulatory Visit: Payer: Self-pay

## 2020-05-09 ENCOUNTER — Encounter (HOSPITAL_COMMUNITY): Payer: Self-pay

## 2020-05-09 DIAGNOSIS — M6281 Muscle weakness (generalized): Secondary | ICD-10-CM

## 2020-05-09 DIAGNOSIS — M25662 Stiffness of left knee, not elsewhere classified: Secondary | ICD-10-CM | POA: Diagnosis not present

## 2020-05-09 DIAGNOSIS — R600 Localized edema: Secondary | ICD-10-CM

## 2020-05-09 DIAGNOSIS — R2689 Other abnormalities of gait and mobility: Secondary | ICD-10-CM

## 2020-05-09 NOTE — Therapy (Signed)
[place], [place], [address] Phone: [phone]   Fax:  [phone]  Physical Therapy Treatment  Patient Details  Name: Ricardo Pacheco MRN: [id number] Date of Birth: 08/12/1956 Referring Provider (PT): Sydnee Cabal, MD   Encounter Date: 05/09/2020   PT End of Session - 05/09/20 1521    Visit Number 8    Number of Visits 12    Date for PT Re-Evaluation 05/19/20    Authorization Type [employer]    Authorization - Visit Number 8    Authorization - Number of Visits 60    Progress Note Due on Visit 10    PT Start Time 5974    PT Stop Time 1525    PT Time Calculation (min) 38 min    Activity Tolerance Patient tolerated treatment well    Behavior During Therapy [facility] for tasks assessed/performed           Past Medical History:  Diagnosis Date  . Decreased white blood cell count     Past Surgical History:  Procedure Laterality Date  . COLONOSCOPY    . COLONOSCOPY N/A 07/18/2017   Procedure: COLONOSCOPY;  Surgeon: Danie Binder, MD;  [facility];  Service: Endoscopy;  Laterality: N/A;  2:15 pm  . POLYPECTOMY  07/18/2017   Procedure: POLYPECTOMY;  Surgeon: Danie Binder, MD;  [facility];  Service: Endoscopy;;  hepatic flexure    There were no vitals filed for this visit.   Subjective Assessment - 05/09/20 1452    Subjective Pt stated he has some burning on incision, pain scale 3-4/10 today.  Reports difficulty withkneeling on incisions for RTW, returns to MD this Friday    Pertinent History LT knee scope 03/30/20    Patient Stated Goals Get back to where he was and being able to work    Currently in Pain? Yes    Pain Score 4     Pain Location Knee    Pain Orientation Left;Anterior    Pain Descriptors / Indicators Burning;Tightness;Sore;Aching    Pain Type Surgical pain    Pain Onset 1 to 4 weeks ago    Pain Frequency Constant    Aggravating Factors  Laying on the left side     Pain Relieving Factors icing his knee    Effect of Pain on Daily Activities limits                             [facility] Adult PT Treatment/Exercise - 05/09/20 0001      Knee/Hip Exercises: Standing   Heel Raises 2 sets;10 reps    Heel Raises Limitations squat then heel raise    Forward Lunges Both;10 reps;3 seconds    Forward Lunges Limitations posterior with foam under knee, no weight bearing.    Functional Squat 2 sets;10 reps    Functional Squat Limitations squat then heel raise    Stairs 7" reciprocal no HHA    SLS SLS 25" BLE no HHA    SLS with Vectors 3x10" on foam    Other Standing Knee Exercises minisquat sidestep 2RT      Manual Therapy   Manual Therapy Myofascial release    Manual therapy comments Manual treatment complete separate from all other activity     Myofascial Release Instructed scar tissue massage                  PT Education -  05/09/20 1519    Education Details Pt educated benefits of scar tissue massage, manual complete and pt able to demonstrate with some cueing fo    Person(s) Educated Patient    Methods Explanation;Demonstration    Comprehension Verbalized understanding;Returned demonstration;Verbal cues required            PT Short Term Goals - 04/26/20 1519      PT SHORT TERM GOAL #1   Title Patient will report understanding and regular compliance with HEP to improve mobility, strength and improve overall functional mobility.    Time 3    Period Weeks    Status On-going    Target Date 04/28/20      PT SHORT TERM GOAL #2   Title Patient will demonstrate left knee extension/flexion active range of motion of at least 0-100 degrees to assist with more normalized gait pattern and stair ambulation.    Time 3    Period Weeks    Status On-going    Target Date 04/28/20             PT Long Term Goals - 04/26/20 1519      PT LONG TERM GOAL #1   Title Patient will perform single limb stance on left/right lower  extremity for 10 seconds in order to assist with stair ambulation.    Time 6    Period Weeks    Status On-going      PT LONG TERM GOAL #2   Title Patient will improve ROM for left knee extension/flexion to 0-120 degrees to improve squatting, and other functional mobility.    Time 6    Period Weeks    Status On-going      PT LONG TERM GOAL #3   Title Patient will demonstrate at least 4+/5 strength in all tested muscle groups to improve overall functional strength and mobility.    Time 6    Period Weeks    Status On-going      PT LONG TERM GOAL #4   Title Patient will demonstrate ability to ambulate at least 1.0 m/s on 2MWT with minimal to no gait deviations for improved ambulation at work.    Time 6    Period Weeks    Status On-going                 Plan - 05/09/20 1525    Clinical Impression Statement Session focus with functional strengthening and balance training.  Pt progressing well towards session wiht ability to SLS at least 25" BLE.  Pt able to demonstrate reciprocal pattern on 7in step height with no HR A and good eccentric control.  Pt educated on scar tissue mobilization to address adhesions for pain control. able to demonstrate with min verbal cueing for mechanics.  No reports of increased pain through session.    Personal Factors and Comorbidities Age    Examination-Activity Limitations Stand;Locomotion Level;Squat;Stairs    Examination-Participation Restrictions Yard Work;Cleaning;Meal Prep;Community Activity    Stability/Clinical Decision Making Stable/Uncomplicated    Clinical Decision Making Low    Rehab Potential Good    PT Frequency 2x / week    PT Duration 6 weeks    PT Treatment/Interventions ADLs/Self Care Home Management;Aquatic Therapy;Cryotherapy;DME Instruction;Electrical Stimulation;Traction;Moist Heat;Gait training;Stair training;Functional mobility training;Therapeutic activities;Therapeutic exercise;Balance training;Neuromuscular  re-education;Patient/family education;Orthotic Fit/Training;Manual techniques;Passive range of motion;Dry needling;Taping    PT Next Visit Plan Review goals for possible DC to HEP next session prior MD apt.  Review mechanics with self- scar tissue massage.  PT Home Exercise Plan 04/07/20: Quad sets, heel slides 04/19/20: sidelying hip abduction, calf stretch with strap 8/11 squat, lateral lunge, sls with vectors; 8/17 inferior mobilizaiton to patella, hamstring curl prone           Patient will benefit from skilled therapeutic intervention in order to improve the following deficits and impairments:  Abnormal gait, Pain, Improper body mechanics, Decreased mobility, Decreased activity tolerance, Decreased endurance, Decreased range of motion, Decreased strength, Hypomobility, Decreased balance, Increased edema  Visit Diagnosis: Muscle weakness (generalized)  Localized edema  Other abnormalities of gait and mobility  Stiffness of left knee, not elsewhere classified     Problem List Patient Active Problem List   Diagnosis Date Noted  . Polyp of hepatic flexure of colon   . Prostate hypertrophy 12/11/2012  . Plantar fasciitis 12/11/2012  . Chronic back pain 12/11/2012   Ihor Austin, LPTA/CLT; CBIS [id number]  Aldona Lento 05/09/2020, 3:30 PM  [place] [address] [place], [place], [address] Phone: [phone]   Fax:  [phone]  Name: Ricardo Pacheco MRN: [id number] Date of Birth: Feb 21, 1956

## 2020-05-11 ENCOUNTER — Encounter (HOSPITAL_COMMUNITY): Payer: Self-pay | Admitting: Physical Therapy

## 2020-05-11 ENCOUNTER — Ambulatory Visit (HOSPITAL_COMMUNITY): Payer: PRIVATE HEALTH INSURANCE | Admitting: Physical Therapy

## 2020-05-11 ENCOUNTER — Other Ambulatory Visit: Payer: Self-pay

## 2020-05-11 DIAGNOSIS — R600 Localized edema: Secondary | ICD-10-CM

## 2020-05-11 DIAGNOSIS — M25662 Stiffness of left knee, not elsewhere classified: Secondary | ICD-10-CM | POA: Diagnosis not present

## 2020-05-11 DIAGNOSIS — R2689 Other abnormalities of gait and mobility: Secondary | ICD-10-CM

## 2020-05-11 DIAGNOSIS — M6281 Muscle weakness (generalized): Secondary | ICD-10-CM

## 2020-05-11 NOTE — Therapy (Signed)
[place] [address] [place], [place], [address] Phone: [phone]   Fax:  [phone]  Physical Therapy Treatment / Progress Note  Patient Details  Name: Ricardo Pacheco MRN: [id number] Date of Birth: Jul 27, 1956 Referring Provider (PT): Sydnee Cabal, MD   Encounter Date: 05/11/2020   Progress Note Reporting Period 04/07/20 to 05/11/20  See note below for Objective Data and Assessment of Progress/Goals.        PT End of Session - 05/11/20 1306    Visit Number 9    Number of Visits 12    Date for PT Re-Evaluation 05/19/20    Authorization Type [employer]    Authorization - Visit Number 9    Authorization - Number of Visits 60    Progress Note Due on Visit 10    PT Start Time 1300    PT Stop Time 1330    PT Time Calculation (min) 30 min    Activity Tolerance Patient tolerated treatment well    Behavior During Therapy WFL for tasks assessed/performed           Past Medical History:  Diagnosis Date  . Decreased white blood cell count     Past Surgical History:  Procedure Laterality Date  . COLONOSCOPY    . COLONOSCOPY N/A 07/18/2017   Procedure: COLONOSCOPY;  Surgeon: Danie Binder, MD;  [facility];  Service: Endoscopy;  Laterality: N/A;  2:15 pm  . POLYPECTOMY  07/18/2017   Procedure: POLYPECTOMY;  Surgeon: Danie Binder, MD;  [facility];  Service: Endoscopy;;  hepatic flexure    There were no vitals filed for this visit.   Subjective Assessment - 05/11/20 1303    Subjective Patient reported that he feels pretty good and could be ready to be done with therapy. He stated the main issue he is having is with kneeling on the ground. He stated he is able to get onto the ground, but he has trouble putting his weight through that left leg.    Pertinent History LT knee scope 03/30/20    Patient Stated Goals Get back to where he was and being able to work    Currently in Pain? No/denies               [facility] PT Assessment - 05/11/20 0001      Assessment   Medical Diagnosis Left knee scope    Referring Provider (PT) Sydnee Cabal, MD    Onset Date/Surgical Date 03/30/20      Observation/Other Assessments   Focus on Therapeutic Outcomes (FOTO)  64.7%   was 40% limited     AROM   Left Knee Extension 0   was 2   Left Knee Flexion 121   was 91     Strength   Right Hip Flexion 5/5    Right Hip Extension 4+/5   was 4-   Right Hip ABduction 5/5   was 4+   Left Hip Flexion 5/5   was 3+   Left Hip Extension 4+/5   was 3-   Left Hip ABduction 4+/5   was 4+   Right Knee Flexion 5/5    Right Knee Extension 5/5    Left Knee Flexion 5/5   was 3+   Left Knee Extension 5/5   was 3+   Right Ankle Dorsiflexion 5/5    Left Ankle Dorsiflexion 5/5      Ambulation/Gait   Ambulation Distance (Feet) 452 Feet  2MWT   Assistive device None    Gait Pattern Within Functional Limits    Gait velocity 1.14 m/s      Static Standing Balance   Static Standing - Balance Support No upper extremity supported    Static Standing - Comment/# of Minutes 30'' each LE                         [facility] Adult PT Treatment/Exercise - 05/11/20 0001      Knee/Hip Exercises: Standing   Other Standing Knee Exercises Kneeling onto blue foam, troubleshooting movement options to reduce stress on LT knee                  PT Education - 05/11/20 1339    Education Details Discussed re-assessment findings and plan for a follow-up visit in 4 weeks if needed.    Person(s) Educated Patient    Methods Explanation    Comprehension Verbalized understanding            PT Short Term Goals - 05/11/20 1307      PT SHORT TERM GOAL #1   Title Patient will report understanding and regular compliance with HEP to improve mobility, strength and improve overall functional mobility.    Time 3    Period Weeks    Status Achieved    Target Date 04/28/20      PT SHORT TERM GOAL #2   Title Patient  will demonstrate left knee extension/flexion active range of motion of at least 0-100 degrees to assist with more normalized gait pattern and stair ambulation.    Baseline 05/11/20: 0- 121 degrees    Time 3    Period Weeks    Status Achieved    Target Date 04/28/20             PT Long Term Goals - 05/11/20 1312      PT LONG TERM GOAL #1   Title Patient will perform single limb stance on left/right lower extremity for 10 seconds in order to assist with stair ambulation.    Time 6    Period Weeks    Status Achieved      PT LONG TERM GOAL #2   Title Patient will improve ROM for left knee extension/flexion to 0-120 degrees to improve squatting, and other functional mobility.    Baseline 05/11/20: 0-121    Time 6    Period Weeks    Status Achieved      PT LONG TERM GOAL #3   Title Patient will demonstrate at least 4+/5 strength in all tested muscle groups to improve overall functional strength and mobility.    Time 6    Period Weeks    Status Achieved      PT LONG TERM GOAL #4   Title Patient will demonstrate ability to ambulate at least 1.0 m/s on 2MWT with minimal to no gait deviations for improved ambulation at work.    Time 6    Period Weeks    Status Achieved      PT LONG TERM GOAL #5   Title Patient will report ability to return to work and regular compliance with HEP without any issues.    Time 4    Period Weeks    Status New    Target Date 06/08/20                 Plan - 05/11/20 1344    Clinical Impression Statement Performed  a re-assessment of patient's progress towards goals. Patient achieved all short term and long term goals. Patient's main remaining deficit is with kneeling. He is able to get into a kneeling position, however, he has an increase in pain through his knee when he maintains this position even on a softer surface such as foam. This kneeling position is one that the patient uses frequently at his job. Discussed with patient different  modification options, however, the modifications limit patient's ability to reach things the way he would be able to reach them at work. Patient is to follow-up with his MD later this week. Plan to follow-up with patient in a month if patient is having any remaining deficits. Patient consented to plan to continue HEP for a month and return for follow-up visit in a month if needed.    Personal Factors and Comorbidities Age    Examination-Activity Limitations Stand;Locomotion Level;Squat;Stairs    Examination-Participation Restrictions Yard Work;Cleaning;Meal Prep;Community Activity    Stability/Clinical Decision Making Stable/Uncomplicated    Rehab Potential Good    PT Frequency 2x / week    PT Duration 6 weeks    PT Treatment/Interventions ADLs/Self Care Home Management;Aquatic Therapy;Cryotherapy;DME Instruction;Electrical Stimulation;Traction;Moist Heat;Gait training;Stair training;Functional mobility training;Therapeutic activities;Therapeutic exercise;Balance training;Neuromuscular re-education;Patient/family education;Orthotic Fit/Training;Manual techniques;Passive range of motion;Dry needling;Taping    PT Next Visit Plan Follow-up in 4 weeks if needed. See how kneeling is going and how work is going.    PT Home Exercise Plan 04/07/20: Quad sets, heel slides 04/19/20: sidelying hip abduction, calf stretch with strap 8/11 squat, lateral lunge, sls with vectors; 8/17 inferior mobilizaiton to patella, hamstring curl prone           Patient will benefit from skilled therapeutic intervention in order to improve the following deficits and impairments:  Abnormal gait, Pain, Improper body mechanics, Decreased mobility, Decreased activity tolerance, Decreased endurance, Decreased range of motion, Decreased strength, Hypomobility, Decreased balance, Increased edema  Visit Diagnosis: Stiffness of left knee, not elsewhere classified  Other abnormalities of gait and mobility  Localized edema  Muscle  weakness (generalized)     Problem List Patient Active Problem List   Diagnosis Date Noted  . Polyp of hepatic flexure of colon   . Prostate hypertrophy 12/11/2012  . Plantar fasciitis 12/11/2012  . Chronic back pain 12/11/2012    Ricardo Pacheco 05/11/2020, 1:45 PM  [place] [address] [place], [place], [address] Phone: [phone]   Fax:  [phone]  Name: Ricardo Pacheco MRN: [id number] Date of Birth: 1956/03/26

## 2020-05-16 ENCOUNTER — Ambulatory Visit (HOSPITAL_COMMUNITY): Payer: PRIVATE HEALTH INSURANCE | Admitting: Physical Therapy

## 2020-05-19 ENCOUNTER — Encounter (HOSPITAL_COMMUNITY): Payer: PRIVATE HEALTH INSURANCE

## 2020-05-24 ENCOUNTER — Other Ambulatory Visit: Payer: Self-pay | Admitting: Family Medicine

## 2020-05-25 LAB — CBC
Hematocrit: 44 % (ref 37.5–51.0)
Hemoglobin: 13.7 g/dL (ref 13.0–17.7)
MCH: 27.1 pg (ref 26.6–33.0)
MCHC: 31.1 g/dL — ABNORMAL LOW (ref 31.5–35.7)
MCV: 87 fL (ref 79–97)
Platelets: 349 10*3/uL (ref 150–450)
RBC: 5.05 x10E6/uL (ref 4.14–5.80)
RDW: 12.9 % (ref 11.6–15.4)
WBC: 3.7 10*3/uL (ref 3.4–10.8)

## 2020-05-25 LAB — CMP14+EGFR
ALT: 16 IU/L (ref 0–44)
AST: 17 IU/L (ref 0–40)
Albumin/Globulin Ratio: 1.2 (ref 1.2–2.2)
Albumin: 4.2 g/dL (ref 3.8–4.8)
Alkaline Phosphatase: 69 IU/L (ref 48–121)
BUN/Creatinine Ratio: 15 (ref 10–24)
BUN: 13 mg/dL (ref 8–27)
Bilirubin Total: 0.2 mg/dL (ref 0.0–1.2)
CO2: 23 mmol/L (ref 20–29)
Calcium: 9.8 mg/dL (ref 8.6–10.2)
Chloride: 105 mmol/L (ref 96–106)
Creatinine, Ser: 0.87 mg/dL (ref 0.76–1.27)
GFR calc Af Amer: 105 mL/min/{1.73_m2} (ref >59)
GFR calc non Af Amer: 91 mL/min/{1.73_m2} (ref >59)
Globulin, Total: 3.4 g/dL (ref 1.5–4.5)
Glucose: 98 mg/dL (ref 65–99)
Potassium: 4.3 mmol/L (ref 3.5–5.2)
Sodium: 141 mmol/L (ref 134–144)
Total Protein: 7.6 g/dL (ref 6.0–8.5)

## 2020-05-25 LAB — LIPID PANEL
Chol/HDL Ratio: 3.2 ratio (ref 0.0–5.0)
Cholesterol, Total: 214 mg/dL — ABNORMAL HIGH (ref 100–199)
HDL: 66 mg/dL (ref >39)
LDL Chol Calc (NIH): 138 mg/dL — ABNORMAL HIGH (ref 0–99)
Triglycerides: 54 mg/dL (ref 0–149)
VLDL Cholesterol Cal: 10 mg/dL (ref 5–40)

## 2020-05-25 LAB — PSA: Prostate Specific Ag, Serum: 4.6 ng/mL — ABNORMAL HIGH (ref 0.0–4.0)

## 2020-05-29 ENCOUNTER — Encounter: Payer: Self-pay | Admitting: Family Medicine

## 2020-05-29 ENCOUNTER — Other Ambulatory Visit: Payer: Self-pay

## 2020-05-29 ENCOUNTER — Ambulatory Visit (INDEPENDENT_AMBULATORY_CARE_PROVIDER_SITE_OTHER): Payer: PRIVATE HEALTH INSURANCE | Admitting: Family Medicine

## 2020-05-29 VITALS — BP 128/82 | HR 96 | Temp 97.4°F | Wt 212.6 lb

## 2020-05-29 DIAGNOSIS — R972 Elevated prostate specific antigen [PSA]: Secondary | ICD-10-CM | POA: Insufficient documentation

## 2020-05-29 DIAGNOSIS — Z0000 Encounter for general adult medical examination without abnormal findings: Secondary | ICD-10-CM | POA: Insufficient documentation

## 2020-05-29 NOTE — Progress Notes (Signed)
Patient ID: Ricardo Pacheco, male    DOB: 06/01/1956, 64 y.o.   MRN: [id number]   Chief Complaint  Patient presents with  . Annual Exam   Subjective:    HPI The patient comes in today for a wellness visit. Recent left knee surgery. Still under care of specialist for this. Improving, but still having some swelling.     A review of their health history was completed.  A review of medications was also completed.  Eating habits: good   Falls/  MVA accidents in past few months: none  Regular exercise: has started trying to walk again since Left knee surgery in July  Specialist pt sees on regular basis: back doctor   Preventative health issues were discussed.   Additional concerns: none  Medical History Ricardo Pacheco has a past medical history of Decreased white blood cell count.   Outpatient Encounter Medications as of 05/29/2020  Medication Sig  . cyclobenzaprine (FLEXERIL) 10 MG tablet TAKE (1) TABLET BY MOUTH EVERY EIGHT HOURS.  . HYDROcodone-acetaminophen (NORCO) 10-325 MG tablet Take 1 tablet by mouth every 6 (six) hours as needed for moderate pain.,  . Multiple Vitamin (MULTIVITAMIN WITH MINERALS) TABS tablet Take 1 tablet by mouth daily.  . nabumetone (RELAFEN) 500 MG tablet TAKE ONE TABLET TWICE A DAY WITH FOOD AS NEEDED.  Marland Kitchen tamsulosin (FLOMAX) 0.4 MG CAPS capsule Take 1 capsule (0.4 mg total) by mouth daily.  . [DISCONTINUED] chlorzoxazone (PARAFON) 500 MG tablet TAKE 1 TABLET BY MOUTH THREE TIMES DAILY AS NEEDED FOR SPASMS.   No facility-administered encounter medications on file as of 05/29/2020.     Review of Systems  Constitutional: Negative.   HENT: Negative.   Eyes: Negative.   Respiratory: Negative.   Cardiovascular: Negative.   Gastrointestinal: Negative.   Endocrine: Negative.   Genitourinary: Negative.   Musculoskeletal: Positive for back pain and joint swelling.       Chronic back pain (sees specialist) S/p left knee surgery (sees specialist)  Skin:  Negative.   Allergic/Immunologic: Negative.   Neurological: Negative.   Hematological: Negative.   Psychiatric/Behavioral: Negative.      Vitals BP 128/82   Pulse 96   Temp (!) 97.4 F (36.3 C)   Wt 212 lb 9.6 oz (96.4 kg)   SpO2 99%   BMI 28.05 kg/m   Objective:   Physical Exam Vitals and nursing note reviewed.  Constitutional:      Appearance: Normal appearance.  HENT:     Right Ear: Tympanic membrane normal.     Left Ear: Tympanic membrane normal.     Nose: Nose normal.     Mouth/Throat:     Mouth: Mucous membranes are moist.     Pharynx: Oropharynx is clear.  Cardiovascular:     Rate and Rhythm: Normal rate and regular rhythm.     Pulses: Normal pulses.     Heart sounds: Normal heart sounds.  Pulmonary:     Effort: Pulmonary effort is normal.     Breath sounds: Normal breath sounds.  Abdominal:     General: Bowel sounds are normal.     Palpations: Abdomen is soft.     Tenderness: There is no abdominal tenderness. There is no guarding.  Genitourinary:    Comments: DRE deferred: urology referral made for elevated PSA. Musculoskeletal:     Comments: S/p left knee surgery: still has swelling and pain.  Skin:    General: Skin is warm and dry.  Neurological:     Mental Status:  He is alert and oriented to person, place, and time.  Psychiatric:        Mood and Affect: Mood normal.        Behavior: Behavior normal.      Assessment and Plan   1. Wellness examination  2. Elevated PSA - Ambulatory referral to Urology  Wellness: reviewed labs, lifestyle modification discussed: eating lots of vegetables and walking as knee recovery permits to improve lipid profile. Walked 2 miles yesterday.  Discussed chronic pain management: has appointment with specialist to manage this.  BP rechecked: 128/82  Last eye exam in June. Dentist every 6 months.  Elevated PSA:  Urology referral made for further evaluation. Have specialist send records to this  office.   Agrees with plan of care discussed today. Understands warning signs to seek further care: chest pain, shortness of breath, any significant changes in health status. Understands to follow-up in one year for wellness, sooner if needed.  Pecolia Ades, FNP-C

## 2020-05-29 NOTE — Patient Instructions (Signed)
Your PSA is elevated, we will make a referral to urology to have further evaluation. Keep eating your vegetables and walking for your cholesterol management.  Preventive Care 43-64 Years Old, Male Preventive care refers to lifestyle choices and visits with your health care provider that can promote health and wellness. This includes:  A yearly physical exam. This is also called an annual well check.  Regular dental and eye exams.  Immunizations.  Screening for certain conditions.  Healthy lifestyle choices, such as eating a healthy diet, getting regular exercise, not using drugs or products that contain nicotine and tobacco, and limiting alcohol use. What can I expect for my preventive care visit? Physical exam Your health care provider will check:  Height and weight. These may be used to calculate body mass index (BMI), which is a measurement that tells if you are at a healthy weight.  Heart rate and blood pressure.  Your skin for abnormal spots. Counseling Your health care provider may ask you questions about:  Alcohol, tobacco, and drug use.  Emotional well-being.  Home and relationship well-being.  Sexual activity.  Eating habits.  Work and work Statistician. What immunizations do I need?  Influenza (flu) vaccine  This is recommended every year. Tetanus, diphtheria, and pertussis (Tdap) vaccine  You may need a Td booster every 10 years. Varicella (chickenpox) vaccine  You may need this vaccine if you have not already been vaccinated. Zoster (shingles) vaccine  You may need this after age 33. Measles, mumps, and rubella (MMR) vaccine  You may need at least one dose of MMR if you were born in 1957 or later. You may also need a second dose. Pneumococcal conjugate (PCV13) vaccine  You may need this if you have certain conditions and were not previously vaccinated. Pneumococcal polysaccharide (PPSV23) vaccine  You may need one or two doses if you smoke  cigarettes or if you have certain conditions. Meningococcal conjugate (MenACWY) vaccine  You may need this if you have certain conditions. Hepatitis A vaccine  You may need this if you have certain conditions or if you travel or work in places where you may be exposed to hepatitis A. Hepatitis B vaccine  You may need this if you have certain conditions or if you travel or work in places where you may be exposed to hepatitis B. Haemophilus influenzae type b (Hib) vaccine  You may need this if you have certain risk factors. Human papillomavirus (HPV) vaccine  If recommended by your health care provider, you may need three doses over 6 months. You may receive vaccines as individual doses or as more than one vaccine together in one shot (combination vaccines). Talk with your health care provider about the risks and benefits of combination vaccines. What tests do I need? Blood tests  Lipid and cholesterol levels. These may be checked every 5 years, or more frequently if you are over 53 years old.  Hepatitis C test.  Hepatitis B test. Screening  Lung cancer screening. You may have this screening every year starting at age 32 if you have a 30-pack-year history of smoking and currently smoke or have quit within the past 15 years.  Prostate cancer screening. Recommendations will vary depending on your family history and other risks.  Colorectal cancer screening. All adults should have this screening starting at age 18 and continuing until age 47. Your health care provider may recommend screening at age 29 if you are at increased risk. You will have tests every 1-10 years, depending  on your results and the type of screening test.  Diabetes screening. This is done by checking your blood sugar (glucose) after you have not eaten for a while (fasting). You may have this done every 1-3 years.  Sexually transmitted disease (STD) testing. Follow these instructions at home: Eating and  drinking  Eat a diet that includes fresh fruits and vegetables, whole grains, lean protein, and low-fat dairy products.  Take vitamin and mineral supplements as recommended by your health care provider.  Do not drink alcohol if your health care provider tells you not to drink.  If you drink alcohol: ? Limit how much you have to 0-2 drinks a day. ? Be aware of how much alcohol is in your drink. In the [place] one drink equals one 12 oz bottle of beer (355 mL), one 5 oz glass of wine (148 mL), or one 1 oz glass of hard liquor (44 mL). Lifestyle  Take daily care of your teeth and gums.  Stay active. Exercise for at least 30 minutes on 5 or more days each week.  Do not use any products that contain nicotine or tobacco, such as cigarettes, e-cigarettes, and chewing tobacco. If you need help quitting, ask your health care provider.  If you are sexually active, practice safe sex. Use a condom or other form of protection to prevent STIs (sexually transmitted infections).  Talk with your health care provider about taking a low-dose aspirin every day starting at age 35. What's next?  Go to your health care provider once a year for a well check visit.  Ask your health care provider how often you should have your eyes and teeth checked.  Stay up to date on all vaccines. This information is not intended to replace advice given to you by your health care provider. Make sure you discuss any questions you have with your health care provider. Document Revised: 08/27/2018 Document Reviewed: 08/27/2018 Elsevier Patient Education  2020 [employer].

## 2020-05-30 ENCOUNTER — Telehealth (HOSPITAL_COMMUNITY): Payer: Self-pay | Admitting: Physical Therapy

## 2020-05-30 NOTE — Telephone Encounter (Signed)
Pt is doing well and wants to be d/c

## 2020-05-31 ENCOUNTER — Telehealth: Payer: Self-pay | Admitting: Family Medicine

## 2020-05-31 NOTE — Telephone Encounter (Signed)
Pt contacted office. Pt is wanting to know if [facility] is suppose to be taking over his pain meds. Pt was informed by [facility] that someone from our office told them that we contacted them to have [facility] take over pain med. Pt is on Hydrocodone. Pt states he takes them only when the back injections wear off. Pt sees [facility] for lower back injections. Pt had annual exam with Santiago Glad on 05/29/20. Please advise. Thank you

## 2020-06-01 NOTE — Telephone Encounter (Signed)
Discussed with pt. Pt verbalized understanding.  °

## 2020-06-01 NOTE — Telephone Encounter (Signed)
Yes, the goal was for them to review and decide if pt still requiring this.  I'm no longer going to prescribe this dose. If pt requires this must get it from orthopedics.   Thx.   Dr. Lovena Le

## 2020-06-08 ENCOUNTER — Ambulatory Visit (HOSPITAL_COMMUNITY): Payer: Self-pay | Admitting: Physical Therapy

## 2020-06-16 ENCOUNTER — Other Ambulatory Visit: Payer: Self-pay

## 2020-06-16 ENCOUNTER — Encounter: Payer: Self-pay | Admitting: Urology

## 2020-06-16 ENCOUNTER — Other Ambulatory Visit: Payer: Self-pay | Admitting: Urology

## 2020-06-16 ENCOUNTER — Ambulatory Visit (INDEPENDENT_AMBULATORY_CARE_PROVIDER_SITE_OTHER): Payer: PRIVATE HEALTH INSURANCE | Admitting: Urology

## 2020-06-16 VITALS — BP 132/80 | HR 81 | Temp 98.9°F | Ht 73.0 in | Wt 208.0 lb

## 2020-06-16 DIAGNOSIS — R972 Elevated prostate specific antigen [PSA]: Secondary | ICD-10-CM

## 2020-06-16 DIAGNOSIS — R351 Nocturia: Secondary | ICD-10-CM

## 2020-06-16 DIAGNOSIS — R3915 Urgency of urination: Secondary | ICD-10-CM | POA: Diagnosis not present

## 2020-06-16 DIAGNOSIS — N401 Enlarged prostate with lower urinary tract symptoms: Secondary | ICD-10-CM | POA: Diagnosis not present

## 2020-06-16 DIAGNOSIS — N138 Other obstructive and reflux uropathy: Secondary | ICD-10-CM

## 2020-06-16 LAB — URINALYSIS, ROUTINE W REFLEX MICROSCOPIC
Bilirubin, UA: NEGATIVE
Glucose, UA: NEGATIVE
Ketones, UA: NEGATIVE
Leukocytes,UA: NEGATIVE
Nitrite, UA: NEGATIVE
Protein,UA: NEGATIVE
RBC, UA: NEGATIVE
Specific Gravity, UA: 1.025 (ref 1.005–1.030)
Urobilinogen, Ur: 0.2 mg/dL (ref 0.2–1.0)
pH, UA: 6 (ref 5.0–7.5)

## 2020-06-16 MED ORDER — LEVOFLOXACIN 750 MG PO TABS: 750.0000 mg | ORAL_TABLET | Freq: Every day | ORAL | 0 refills | Status: DC

## 2020-06-16 NOTE — Progress Notes (Signed)
Urological Symptom Review  Patient is experiencing the following symptoms: Frequent urination Hard to postpone urination Get up at night to urinate Leakage of urine   Review of Systems  Gastrointestinal (upper)  : Negative for upper GI symptoms  Gastrointestinal (lower) : Negative for lower GI symptoms  Constitutional : Negative for symptoms  Skin: Negative for skin symptoms  Eyes: Negative for eye symptoms  Ear/Nose/Throat : Negative for Ear/Nose/Throat symptoms  Hematologic/Lymphatic: Negative for Hematologic/Lymphatic symptoms  Cardiovascular : Negative for cardiovascular symptoms  Respiratory : Negative for respiratory symptoms  Endocrine: Negative for endocrine symptoms  Musculoskeletal: Back pain Joint pain  Neurological: Negative for neurological symptoms  Psychologic: Negative for psychiatric symptoms  

## 2020-06-16 NOTE — Patient Instructions (Signed)
   Appointment Time: 8:30 arrive 8:15 Appointment Date: Oct. 15th, 2021  Location: [facility]   Prostate Biopsy Instructions  Stop all aspirin or blood thinners (aspirin, plavix, coumadin, warfarin, motrin, ibuprofen, advil, aleve, naproxen, naprosyn) for 7 days prior to the procedure.  If you have any questions about stopping these medications, please contact your primary care physician or cardiologist.  Having a light meal prior to the procedure is recommended.  If you are diabetic or have low blood sugar please bring a small snack or glucose tablet.  A Fleets enema is needed to be purchased over the counter at a local pharmacy and used 2 hours before you scheduled appointment.  This can be purchased over the counter at any pharmacy.  Antibiotics will be administered in the clinic at the time of the procedure and 1 tablet has been sent to your pharmacy. Please take the antibiotic as prescribed.    Please bring someone with you to the procedure to drive you home if you are given a valium to take prior to your procedure.   If you have any questions or concerns, please feel free to call the office at [phone] or send a Mychart message.    Thank you, [facility]

## 2020-06-16 NOTE — Progress Notes (Signed)

## 2020-06-16 NOTE — Progress Notes (Signed)
Subjective: 1. Elevated PSA   2. BPH with urinary obstruction   3. Urgency of urination   4. Nocturia      Mr. Ricardo Pacheco is a 64 yo AAM who is sent by Elvia Collum DO for an elevated PSA of 4.6 on 05/24/20 which is up from 2.5 in 10/20 and 2.4 in 9/19.  He has moderate LUTS with some urgency and nocturia that is worse with fluids.  His IPSS is 12.  He has been on tamsulosin for several years.  He is on chronic hydrocodone for chronic back pain but he only takes it intermittently when the epidurals wear off.  He has no history of UTI's, stone or GU surgery.  He has two brother with prostate cancer in their upper 28's.    ROS:  ROS  No Known Allergies  Past Medical History:  Diagnosis Date  . Arthritis   . Decreased white blood cell count   . Hypercholesteremia     Past Surgical History:  Procedure Laterality Date  . COLONOSCOPY    . COLONOSCOPY N/A 07/18/2017   Procedure: COLONOSCOPY;  Surgeon: Danie Binder, MD;  Location: [facility];  Service: Endoscopy;  Laterality: N/A;  2:15 pm  . KNEE ARTHROPLASTY    . POLYPECTOMY  07/18/2017   Procedure: POLYPECTOMY;  Surgeon: Danie Binder, MD;  Location: [facility];  Service: Endoscopy;;  hepatic flexure    Social History   Socioeconomic History  . Marital status: Married    Spouse name: Not on file  . Number of children: 2  . Years of education: Not on file  . Highest education level: Not on file  Occupational History  . Occupation: [place] [employer]  Tobacco Use  . Smoking status: Former Research scientist (life sciences)  . Smokeless tobacco: Never Used  Vaping Use  . Vaping Use: Never used  Substance and Sexual Activity  . Alcohol use: No  . Drug use: No  . Sexual activity: Not on file  Other Topics Concern  . Not on file  Social History Narrative  . Not on file   Social Determinants of Health   Financial Resource Strain:   . Difficulty of Paying Living Expenses: Not on file  Food Insecurity:   . Worried About Charity fundraiser in  the Last Year: Not on file  . Ran Out of Food in the Last Year: Not on file  Transportation Needs:   . Lack of Transportation (Medical): Not on file  . Lack of Transportation (Non-Medical): Not on file  Physical Activity:   . Days of Exercise per Week: Not on file  . Minutes of Exercise per Session: Not on file  Stress:   . Feeling of Stress : Not on file  Social Connections:   . Frequency of Communication with Friends and Family: Not on file  . Frequency of Social Gatherings with Friends and Family: Not on file  . Attends Religious Services: Not on file  . Active Member of Clubs or Organizations: Not on file  . Attends Archivist Meetings: Not on file  . Marital Status: Not on file  Intimate Partner Violence:   . Fear of Current or Ex-Partner: Not on file  . Emotionally Abused: Not on file  . Physically Abused: Not on file  . Sexually Abused: Not on file    Family History  Problem Relation Age of Onset  . Hypertension Mother   . Stroke Father   . Prostate cancer Brother   .  Throat cancer Brother   . Prostate cancer Brother   . Colon cancer Neg Hx   . Colon polyps Neg Hx     Anti-infectives: Anti-infectives (From admission, onward)   Start     Dose/Rate Route Frequency Ordered Stop   06/16/20 0000  levofloxacin (LEVAQUIN) 750 MG tablet        750 mg Oral Daily 06/16/20 1425        Current Outpatient Medications  Medication Sig Dispense Refill  . HYDROcodone-acetaminophen (NORCO) 10-325 MG tablet Take 1 tablet by mouth every 6 (six) hours as needed for moderate pain., 60 tablet 0  . methocarbamol (ROBAXIN) 500 MG tablet Take 500 mg by mouth 4 (four) times daily.    . Multiple Vitamin (MULTIVITAMIN WITH MINERALS) TABS tablet Take 1 tablet by mouth daily.    . nabumetone (RELAFEN) 500 MG tablet TAKE ONE TABLET TWICE A DAY WITH FOOD AS NEEDED. 42 tablet 5  . tamsulosin (FLOMAX) 0.4 MG CAPS capsule Take 1 capsule (0.4 mg total) by mouth daily. 30 capsule 5  .  cyclobenzaprine (FLEXERIL) 10 MG tablet TAKE (1) TABLET BY MOUTH EVERY EIGHT HOURS. 21 tablet 0  . levofloxacin (LEVAQUIN) 750 MG tablet Take 1 tablet (750 mg total) by mouth daily. Take one hour prior to procedure. 1 tablet 0   No current facility-administered medications for this visit.     Objective: Vital signs in last 24 hours: BP 132/80   Pulse 81   Temp 98.9 F (37.2 C)   Ht 6\' 1"  (1.854 m)   Wt 208 lb (94.3 kg)   BMI 27.44 kg/m   Intake/Output from previous day: No intake/output data recorded. Intake/Output this shift: @IOTHISSHIFT @   Physical Exam Vitals reviewed.  Constitutional:      Appearance: Normal appearance.  HENT:     Head: Normocephalic and atraumatic.  Cardiovascular:     Rate and Rhythm: Normal rate and regular rhythm.     Heart sounds: Normal heart sounds.  Pulmonary:     Effort: Pulmonary effort is normal. No respiratory distress.     Breath sounds: Normal breath sounds.  Abdominal:     General: Abdomen is flat.     Palpations: Abdomen is soft.     Tenderness: There is no abdominal tenderness.     Hernia: No hernia is present.  Genitourinary:    Comments: Normal penis, scrotum, testes and epididymis AP without lesions.  NST without mass. Prostate is 2+ and benign.  SV's non-palpable.  Musculoskeletal:        General: No swelling. Normal range of motion.     Cervical back: Normal range of motion and neck supple.  Skin:    General: Skin is warm and dry.  Neurological:     General: No focal deficit present.     Mental Status: He is alert and oriented to person, place, and time.  Psychiatric:        Mood and Affect: Mood normal.        Behavior: Behavior normal.     Lab Results:  No results found for this or any previous visit (from the past 24 hour(s)).  BMET No results for input(s): NA, K, CL, CO2, GLUCOSE, BUN, CREATININE, CALCIUM in the last 72 hours. PT/INR No results for input(s): LABPROT, INR in the last 72 hours. ABG No  results for input(s): PHART, HCO3 in the last 72 hours.  Invalid input(s): PCO2, PO2  Studies/Results: No results found.   Assessment/Plan: Elevated PSA.  His PSA is up 2 points in the past year.   Since his exam is benign, I will repeat a PSA with a week of abstinence and will proceed with a biopsy if still elevated.   If it is down, I will recheck in 3 months.   Biopsy risks of bleeding, infection and voiding difficulty reviewed.   Family history of prostate cancer.  He has 2 brothers with cancer.    BPH with BOO.  He is voiding ok on tamsulosin.   Meds ordered this encounter  Medications  . levofloxacin (LEVAQUIN) 750 MG tablet    Sig: Take 1 tablet (750 mg total) by mouth daily. Take one hour prior to procedure.    Dispense:  1 tablet    Refill:  0     Orders Placed This Encounter  Procedures  . [place] PROSTATE BIOPSY MULTIPLE    Standing Status:   Future    Standing Expiration Date:   07/17/2020    Order Specific Question:   Reason for Exam (SYMPTOM  OR DIAGNOSIS REQUIRED)    Answer:   elevated PSA    Order Specific Question:   Preferred location?    Answer:   [facility]  . Urinalysis, Routine w reflex microscopic  . PSA, total and free    After a week of abstinence    Standing Status:   Future    Standing Expiration Date:   07/17/2020     Return for PSA in a week and if still up, next available prostate [place] and biopsy. .    CC: Dr. Elvia Collum    Irine Seal 06/16/2020 [phone]

## 2020-06-21 ENCOUNTER — Other Ambulatory Visit: Payer: PRIVATE HEALTH INSURANCE

## 2020-06-21 ENCOUNTER — Other Ambulatory Visit: Payer: Self-pay

## 2020-06-21 DIAGNOSIS — R972 Elevated prostate specific antigen [PSA]: Secondary | ICD-10-CM

## 2020-06-22 ENCOUNTER — Encounter (HOSPITAL_COMMUNITY): Payer: Self-pay | Admitting: Physical Therapy

## 2020-06-22 LAB — PSA, TOTAL AND FREE
PSA, Free Pct: 14 %
PSA, Free: 0.56 ng/mL
Prostate Specific Ag, Serum: 4 ng/mL (ref 0.0–4.0)

## 2020-06-22 NOTE — Therapy (Signed)
[place], [place], [address] Phone: [phone]   Fax:  [phone]  Patient Details  Name: Ricardo Pacheco MRN: [id number] Date of Birth: 19-Apr-1956 Referring Provider:  No ref. provider found  Encounter Date: 06/22/2020   PHYSICAL THERAPY DISCHARGE SUMMARY  Visits from Start of Care: 9  Current functional level related to goals / functional outcomes: At most recent reassessment PT Short Term Goals - 05/11/20 1307              PT SHORT TERM GOAL #1    Title Patient will report understanding and regular compliance with HEP to improve mobility, strength and improve overall functional mobility.     Time 3     Period Weeks     Status Achieved     Target Date 04/28/20          PT SHORT TERM GOAL #2    Title Patient will demonstrate left knee extension/flexion active range of motion of at least 0-100 degrees to assist with more normalized gait pattern and stair ambulation.     Baseline 05/11/20: 0- 121 degrees     Time 3     Period Weeks     Status Achieved     Target Date 04/28/20                  PT Long Term Goals - 05/11/20 1312              PT LONG TERM GOAL #1    Title Patient will perform single limb stance on left/right lower extremity for 10 seconds in order to assist with stair ambulation.     Time 6     Period Weeks     Status Achieved          PT LONG TERM GOAL #2    Title Patient will improve ROM for left knee extension/flexion to 0-120 degrees to improve squatting, and other functional mobility.     Baseline 05/11/20: 0-121     Time 6     Period Weeks     Status Achieved          PT LONG TERM GOAL #3    Title Patient will demonstrate at least 4+/5 strength in all tested muscle groups to improve overall functional strength and mobility.     Time 6     Period Weeks     Status Achieved          PT LONG TERM GOAL #4    Title Patient will  demonstrate ability to ambulate at least 1.0 m/s on 2MWT with minimal to no gait deviations for improved ambulation at work.     Time 6     Period Weeks     Status Achieved          PT LONG TERM GOAL #5    Title Patient will report ability to return to work and regular compliance with HEP without any issues.     Time 4     Period Weeks     Status New     Target Date 06/08/20          Remaining deficits: See above. Patient called to state he was doing well rather than coming in for his follow-up visit Education / Equipment: HEP  Plan: Patient agrees to discharge.  Patient goals were met. Patient is being discharged due to meeting the stated rehab goals.  ?????  Clarene Critchley PT, DPT 4:50 PM, 06/22/20 [place], [place], [address] Phone: [phone]   Fax:  [phone]

## 2020-06-30 ENCOUNTER — Ambulatory Visit (HOSPITAL_COMMUNITY)
Admission: RE | Admit: 2020-06-30 | Discharge: 2020-06-30 | Disposition: A | Discharge status: Home / Self Care | Payer: PRIVATE HEALTH INSURANCE | Source: Ambulatory Visit | Attending: Urology | Admitting: Urology

## 2020-06-30 ENCOUNTER — Other Ambulatory Visit: Payer: Self-pay

## 2020-06-30 ENCOUNTER — Other Ambulatory Visit: Payer: Self-pay | Admitting: Urology

## 2020-06-30 ENCOUNTER — Ambulatory Visit (INDEPENDENT_AMBULATORY_CARE_PROVIDER_SITE_OTHER): Payer: PRIVATE HEALTH INSURANCE | Admitting: Urology

## 2020-06-30 DIAGNOSIS — R972 Elevated prostate specific antigen [PSA]: Secondary | ICD-10-CM

## 2020-06-30 DIAGNOSIS — C61 Malignant neoplasm of prostate: Secondary | ICD-10-CM | POA: Insufficient documentation

## 2020-06-30 MED ORDER — CEFTRIAXONE SODIUM 1 G IJ SOLR
INTRAMUSCULAR | Status: AC
  Filled 2020-06-30: qty 10

## 2020-06-30 NOTE — Progress Notes (Signed)
Prostate Biopsy Procedure   Informed consent was obtained after discussing risks/benefits of the procedure.  A time out was performed to ensure correct patient identity.  Pre-Procedure: - Last PSA Level: 4.0  - Prostate exam findings: Benign  - Rocephin 1 gm IM given prophylactically - Levaquin 750 mg administered PO -Transrectal Ultrasound performed using the 10MHz probe.  - Seminal Vesicles: Normal. - Prostate: Symmetrical with normal PZ and TZ.  - Prostate volume:  30.4 gm - Middle lobe: NA - Hypoechoic/Hyperechoic lesions: NA - Prostate calculi: NA  Procedure: - Prostate block performed using 10 cc 1% lidocaine and needle biopsies taken from sextant areas, a total of 12 under ultrasound guidance.   No additional cores were obtained.   Post-Procedure: - Patient tolerated the procedure well - He was counseled to seek immediate medical attention if experiences any severe pain, significant bleeding, or fevers - He will be called with the biopsy results when available.

## 2020-07-06 NOTE — Progress Notes (Signed)
I have given him the results and see he has f/u on 11/12 at 4pm which he should keep.

## 2020-07-13 ENCOUNTER — Ambulatory Visit: Payer: PRIVATE HEALTH INSURANCE | Admitting: Family Medicine

## 2020-07-27 NOTE — Progress Notes (Addendum)
Subjective: 1. Prostate cancer ([place])   2. Elevated PSA   3. BPH with urinary obstruction   4. Urgency of urination      Mr. Heinle returns in f/u from a prostate biopsy on 06/30/20.  His prostate volume is 55ml.  He has T1c Nx Mx prostate cancer with 2 cores of Gleason 7(3+4) disease with >50% involvement with one on each side  and 8 cores of Gleason 6 disease with up to 80% involvement with 4 on each side.    He has well preserved erectile function.  He has BPH with BOO and is on tamsulosin with moderate LUTS.  [facility] nomogram predicts 43% OCD, 56% ECE, 8% LNI and 10% SVI.    Gu hx: Mr. Harvill is a 64 yo AAM who is sent by Elvia Collum DO for an elevated PSA of 4.6 on 05/24/20 which is up from 2.5 in 10/20 and 2.4 in 9/19.  He has moderate LUTS with some urgency and nocturia that is worse with fluids.  His IPSS is 12.  He has been on tamsulosin for several years.  He is on chronic hydrocodone for chronic back pain but he only takes it intermittently when the epidurals wear off.  He has no history of UTI's, stone or GU surgery.  He has two brother with prostate cancer in their upper 62's.    ROS:  ROS  No Known Allergies  Past Medical History:  Diagnosis Date  . Arthritis   . Decreased white blood cell count   . Hypercholesteremia     Past Surgical History:  Procedure Laterality Date  . COLONOSCOPY    . COLONOSCOPY N/A 07/18/2017   Procedure: COLONOSCOPY;  Surgeon: Danie Binder, MD;  Location: [facility];  Service: Endoscopy;  Laterality: N/A;  2:15 pm  . KNEE ARTHROPLASTY    . POLYPECTOMY  07/18/2017   Procedure: POLYPECTOMY;  Surgeon: Danie Binder, MD;  Location: [facility];  Service: Endoscopy;;  hepatic flexure    Social History   Socioeconomic History  . Marital status: Married    Spouse name: Not on file  . Number of children: 2  . Years of education: Not on file  . Highest education level: Not on file  Occupational History  . Occupation: [employer]  [place]  Tobacco Use  . Smoking status: Former Research scientist (life sciences)  . Smokeless tobacco: Never Used  Vaping Use  . Vaping Use: Never used  Substance and Sexual Activity  . Alcohol use: No  . Drug use: No  . Sexual activity: Not on file  Other Topics Concern  . Not on file  Social History Narrative  . Not on file   Social Determinants of Health   Financial Resource Strain:   . Difficulty of Paying Living Expenses: Not on file  Food Insecurity:   . Worried About Charity fundraiser in the Last Year: Not on file  . Ran Out of Food in the Last Year: Not on file  Transportation Needs:   . Lack of Transportation (Medical): Not on file  . Lack of Transportation (Non-Medical): Not on file  Physical Activity:   . Days of Exercise per Week: Not on file  . Minutes of Exercise per Session: Not on file  Stress:   . Feeling of Stress : Not on file  Social Connections:   . Frequency of Communication with Friends and Family: Not on file  . Frequency of Social Gatherings with Friends and Family: Not on file  .  Attends Religious Services: Not on file  . Active Member of Clubs or Organizations: Not on file  . Attends Archivist Meetings: Not on file  . Marital Status: Not on file  Intimate Partner Violence:   . Fear of Current or Ex-Partner: Not on file  . Emotionally Abused: Not on file  . Physically Abused: Not on file  . Sexually Abused: Not on file    Family History  Problem Relation Age of Onset  . Hypertension Mother   . Stroke Father   . Prostate cancer Brother   . Throat cancer Brother   . Prostate cancer Brother   . Colon cancer Neg Hx   . Colon polyps Neg Hx     Anti-infectives: Anti-infectives (From admission, onward)   None      Current Outpatient Medications  Medication Sig Dispense Refill  . cyclobenzaprine (FLEXERIL) 10 MG tablet TAKE (1) TABLET BY MOUTH EVERY EIGHT HOURS. 21 tablet 0  . HYDROcodone-acetaminophen (NORCO) 10-325 MG tablet Take 1 tablet by mouth  every 6 (six) hours as needed for moderate pain., 60 tablet 0  . methocarbamol (ROBAXIN) 500 MG tablet Take 500 mg by mouth 4 (four) times daily.    . Multiple Vitamin (MULTIVITAMIN WITH MINERALS) TABS tablet Take 1 tablet by mouth daily.    . nabumetone (RELAFEN) 500 MG tablet TAKE ONE TABLET TWICE A DAY WITH FOOD AS NEEDED. 42 tablet 5  . tamsulosin (FLOMAX) 0.4 MG CAPS capsule Take 1 capsule (0.4 mg total) by mouth daily. 30 capsule 5   No current facility-administered medications for this visit.     Objective: Vital signs in last 24 hours: BP 119/74   Pulse 73   Temp 98.3 F (36.8 C)   Ht 6\' 1"  (1.854 m)   Wt 208 lb (94.3 kg)   BMI 27.44 kg/m   Intake/Output from previous day: No intake/output data recorded. Intake/Output this shift: @IOTHISSHIFT @   Physical Exam   Studies/Results: Path report reviewed.    Assessment/Plan: Prostate cancer:  T1c Nx Mx Gleason 7(3+4) prostate cancer.   With the bulk of his cancer, rising PSA and young age and minimal comorbidities he is not a good candidate for active surveillance.   He is a good candidate for RALP, Brachytherapy and EXRT.   I have reviewed the risks of all of those options in detail.  I discussed Cryo and HIFU briefly but didn't recommend those options for him but I did review some of the risks.  He is most interested in brachytherapy vs surgical therapy so I will get him set up to be seen by the [facility] to review those options.    Family history of prostate cancer.  He has 2 brothers with cancer.    BPH with BOO.  He is voiding ok on tamsulosin.   No orders of the defined types were placed in this encounter.    Orders Placed This Encounter  Procedures  . Ambulatory referral to Urology    Referral Priority:   Routine    Referral Type:   Consultation    Referral Reason:   Specialty Services Required    Referred to Provider:   Raynelle Bring, MD    Requested Specialty:   Urology    Number of Visits Requested:   1      Return for He needs to be set up for a consultation in [facility] with the prostate [place]. Marland Kitchen    CC: Dr. Lorrin Jackson  Jeffie Pollock 07/28/2020 [phone]

## 2020-07-28 ENCOUNTER — Ambulatory Visit (INDEPENDENT_AMBULATORY_CARE_PROVIDER_SITE_OTHER): Payer: PRIVATE HEALTH INSURANCE | Admitting: Urology

## 2020-07-28 ENCOUNTER — Encounter: Payer: Self-pay | Admitting: Urology

## 2020-07-28 ENCOUNTER — Other Ambulatory Visit: Payer: Self-pay

## 2020-07-28 VITALS — BP 119/74 | HR 73 | Temp 98.3°F | Ht 73.0 in | Wt 208.0 lb

## 2020-07-28 DIAGNOSIS — R972 Elevated prostate specific antigen [PSA]: Secondary | ICD-10-CM

## 2020-07-28 DIAGNOSIS — N401 Enlarged prostate with lower urinary tract symptoms: Secondary | ICD-10-CM | POA: Diagnosis not present

## 2020-07-28 DIAGNOSIS — R3915 Urgency of urination: Secondary | ICD-10-CM | POA: Diagnosis not present

## 2020-07-28 DIAGNOSIS — C61 Malignant neoplasm of prostate: Secondary | ICD-10-CM

## 2020-07-28 DIAGNOSIS — N138 Other obstructive and reflux uropathy: Secondary | ICD-10-CM

## 2020-07-28 NOTE — Patient Instructions (Signed)
Hydrogel Spacer Implantation for Prostate Cancer Hydrogel spacer implantation is a procedure to place a small gel spacer between your rectum and your prostate gland. You may have this procedure before starting radiation treatment for prostate cancer. The gel is inserted to create more space between your prostate, your rectum, and the base of your penis. This added space will protect your rectum from radiation damage during your prostate cancer treatment. The procedure may:  Reduce the radiation that reaches your rectum.  Reduce the amount of pain that you have from radiation treatment.  Lower your risk for problems with bowel movements, passing urine, or maintaining an erection. The hydrogel spacer lasts for 3 months. This is usually enough time to complete your radiation treatment. After that, the gel will break down and be absorbed by your body. It does not need to be removed. Tell your health care provider about:  Any allergies you have.  All medicines you are taking, including vitamins, herbs, eye drops, creams, and over-the-counter medicines.  Any problems you or family members have had with anesthetic medicines.  Any blood disorders you have.  Any surgeries you have had.  Any medical conditions you have. What are the risks? Generally, this is a safe procedure. However, problems may occur, including:  Infection.  Bleeding.  Allergic reactions to medicines.  Damage to nearby structures or organs. What happens before the procedure? Medicines  Ask your health care provider about: ? Changing or stopping your regular medicines. This is especially important if you are taking diabetes medicines or blood thinners. ? Taking medicines such as aspirin and ibuprofen. These medicines can thin your blood. Do not take these medicines unless your health care provider tells you to take them. ? Taking over-the-counter medicines, vitamins, herbs, and supplements.  You will be given  instructions for taking medicines the day before your procedure to empty your bowel and rectum (bowel prep). Follow these instructions carefully. If the rectum has not been emptied enough, the procedure may have to be canceled and rescheduled. Staying hydrated Follow instructions from your health care provider about hydration, which may include:  Up to 2 hours before the procedure - you may continue to drink clear liquids, such as water, clear fruit juice, black coffee, and plain tea.  Eating and drinking restrictions Follow instructions from your health care provider about eating and drinking, which may include:  8 hours before the procedure - stop eating heavy meals or foods, such as meat, fried foods, or fatty foods.  6 hours before the procedure - stop eating light meals or foods, such as toast or cereal.  6 hours before the procedure - stop drinking milk or drinks that contain milk.  2 hours before the procedure - stop drinking clear liquids. General instructions  Ask your health care provider what steps will be taken to help prevent infection. These may include: ? Removing hair at the surgery site. ? Washing skin with a germ-killing soap. ? Taking antibiotic medicine.  You may be asked to shower with a germ-killing soap.  Plan to have someone take you home from the hospital or clinic. What happens during the procedure?   An IV will be inserted into one of your veins.  You will be given one or more of the following: ? A medicine to help you relax (sedative). ? A medicine to numb the area (local anesthetic). This will be injected into the area between your rectum and your scrotum (perineal area). ? A medicine to make you  fall asleep (general anesthetic).  An ultrasound probe will be placed into your rectum. This probe will produce sound wave images of the space between your rectum and prostate.  Using the ultrasound images, your surgeon will insert a needle into the space  between your rectum and prostate.  The hydrogel spacing liquid will be injected into this space. This liquid will turn into a gel that expands to form a soft protective barrier.  The needle will be removed. The procedure may vary among health care providers and hospitals. What happens after the procedure?  Your blood pressure, heart rate, breathing rate, and blood oxygen level will be monitored until you leave the hospital or clinic.  Do not drive for 24 hours if you were given a sedative during the procedure. Summary  Hydrogel spacer implantation is a procedure to place a small gel spacer between your rectum and your prostate gland.  You may have this procedure before starting radiation treatment for prostate cancer. Having a hydrogel spacer in place will help prevent or reduce damage to your rectum during the radiation treatment.  The liquid that will form the gel spacer is inserted through a needle while you are under local or general anesthesia.  The gel will break down and be absorbed by your body after 3 months. It does not need to be removed. This information is not intended to replace advice given to you by your health care provider. Make sure you discuss any questions you have with your health care provider. Document Revised: 03/06/2018 Document Reviewed: 03/06/2018 Elsevier Patient Education  [place]. External Beam Radiation Therapy External beam radiation therapy is a type of radiation treatment. This type of radiation therapy can deliver radiation to a fairly large area. This is the most common type of radiation therapy for cancer. This therapy may be done to:  Treat cancer by: ? Destroying cancer cells. Radiation delivered during the treatment damages cancer cells. It may also damage normal cells, but normal cells have the DNA to repair themselves while cancer cells do not. ? Helping with symptoms of your cancer. ? Stopping the growth of any remaining cancer cells  after surgery. ? Preventing cancer cells from growing in areas that do not have cancer (prophylactic radiation therapy).  Treat or shrink a tumor.  Reduce pain (palliative therapy). The amount of radiation you will receive and the length of therapy depend on your medical condition. You should not feel the radiation being delivered or any pain during your therapy. Let your health care provider know about:  Any allergies you have.  All medicines you are taking, including vitamins, herbs, eye drops, creams, and over-the-counter medicines.  Any problems you or family members have had with anesthetic medicines.  Any blood disorders you have.  Any surgeries you have had.  Any medical conditions you have.  Whether you are pregnant or may be pregnant. What are the risks? Generally, this is a safe procedure. However, radiation therapy can place a person at a higher risk for a second cancer later in life. Most people experience side effects from the therapy. Side effects depend on the amount of radiation and the part of the body that was exposed to radiation. The most common side effects include:  Skin changes.  Hair loss.  Fatigue.  Nausea and vomiting. What happens before the procedure?  There will be a planning session (simulation). During the session: ? Your health care provider will plan exactly where the radiation will be delivered (treatment  field). ? You will be positioned for your therapy. The goal is to have a position that can be reproduced for each therapy session. ? Temporary marks may be drawn on your body. Permanent marks may also be drawn on your body in order for you to be positioned the same way for each therapy session. ? A tool that holds a body part in place (immobilization device) may be used to keep the area of treatment in the correct position.  Follow instructions from your health care provider about eating or drinking restrictions.  Ask your health care  provider about changing or stopping your regular medicines. This is especially important if you are taking diabetes medicines or blood thinners. What happens during the procedure?   You will either lie on a table or sit in a chair in the position determined for your therapy.  You may have a heavy shield placed on you to protect tissues and organs that are not being treated.  The radiation machine (linear accelerator) will move around you to deliver the radiation in exact doses from different angles. The machine will not touch you. The procedure may vary among health care providers and hospitals. What happens after the procedure?  You may return to your normal routine including diet, activities, and medicines as told by your health care provider.  You may want to have someone take you home from the hospital or clinic. Summary  External beam radiation therapy is a type of radiation treatment for cancer.  The amount of radiation you will receive and the length of therapy depend on your medical condition.  Most people experience side effects from the therapy. Side effects depend on the amount of radiation and the part of the body that was exposed to radiation. This information is not intended to replace advice given to you by your health care provider. Make sure you discuss any questions you have with your health care provider. Document Revised: 09/03/2017 Document Reviewed: 09/02/2016 Elsevier Patient Education  2020 [place] for Prostate Cancer Brachytherapy for prostate cancer is radiation treatment that is placed inside of the prostate (prostate gland). There are several types of brachytherapy:  Low-dose rate (LDR) therapy. This may involve temporary implants or permanent radioactive seed or pellet implants. The radiation does not travel far from the prostate, which means that healthy, noncancerous tissues around the prostate receive only a small dose of radiation.  This helps to protect those tissues from injury. This type of treatment may be followed by a course of external beam radiation. ? Temporary low-dose implants are left in the prostate for 1-7 days. The implants are needles, applicators, or thin, plastic tubes (catheters) that contain radioactive material. You will need to stay in the hospital while the implant is in place. ? Permanent low-dose implants (seeds or pellets) are injected into the prostate, and they work for up to one year after they are inserted. They are left in place and are not removed.  High-dose rate (HDR) therapy. This is given through needles, applicators, or catheters that contain radioactive material. The tubes are removed after treatment, and no radiation is left in the prostate. This type of treatment may be followed by a course of external beam radiation. Tell a health care provider about:  Any allergies you have.  All medicines you are taking, including vitamins, herbs, eye drops, creams, and over-the-counter medicines.  Any problems you or family members have had with anesthetic medicines.  Any surgeries you have had.  Any  blood disorders you have.  Any medical conditions you have. What are the risks? Generally, this is a safe procedure. However, problems may occur, including:  Inflammation of the rectum.  Problems getting or keeping an erection (erectile dysfunction).  Trouble urinating.  Diarrhea.  Bleeding.  Loss of bowel control. What happens before the procedure? Staying hydrated Follow instructions from your health care provider about hydration, which may include:  Up to 2 hours before the procedure - you may continue to drink clear liquids, such as water, clear fruit juice, black coffee, and plain tea. Eating and drinking Follow instructions from your health care provider about eating and drinking, which may include:  8 hours before the procedure - stop eating heavy meals or foods such as meat,  fried foods, or fatty foods.  6 hours before the procedure - stop eating light meals or foods, such as toast or cereal.  6 hours before the procedure - stop drinking milk or drinks that contain milk.  2 hours before the procedure - stop drinking clear liquids. Medicines  Ask your health care provider about: ? Changing or stopping your regular medicines. This is especially important if you are taking diabetes medicines or blood thinners. ? Taking medicines such as aspirin and ibuprofen. These medicines can thin your blood. Do not take these medicines before your procedure if your health care provider instructs you not to.  You may be given antibiotic medicine to help prevent infection. General instructions  Plan to have someone take you home from the hospital or clinic.  If you will be going home right after the procedure, plan to have someone with you for 24 hours.  You may have imaging tests done, including an ultrasound, CT scan, or MRI.  You may have blood tests done.  You may have a test to check the electrical signals in your heart (electrocardiogram).  You may need to take medicine to clean out your bowel (bowel prep). What happens during the procedure?  To lower your risk of infection: ? Your health care team will wash or sanitize their hands. ? Your skin will be washed with soap. ? Hair may be removed from the surgical area.  An IV will be inserted into one of your veins.  You will be given one or more of the following: ? A medicine to help you relax (sedative). ? A medicine to numb the area (local anesthetic). ? A medicine to make you fall asleep (general anesthetic).  You may have a thin, plastic tube (catheter) inserted to drain your bladder.  If you are receiving brachytherapy with implants: ? A needle, applicator, or catheter will be inserted into the prostate. It will be inserted through a body cavity, such as the rectum, or through the tissue between the  testicles and the anus (perineum). ? An X-ray, ultrasound, MRI, or CT scan will be used to guide the catheter or applicator toward the prostate. ? Radioactive seeds, wires, or ribbons will be fed through the catheter or applicator. ? If the high-dose method is used:  The radioactive wires or ribbons will be left in for a few minutes and then removed.  Once the treatment is finished, the catheter or applicator will be removed. ? If the low-dose method is used, the implant will stay in place for 1-7 days.  You will remain in the hospital while the implant is in place.  Once the treatment is finished, the radioactive material and catheter will be removed.  If you are  receiving permanent, low-dose brachytherapy: ? Small, radioactive seeds or pellets will be injected into your prostate. This may be done through a catheter, needle, or applicator. ? The catheter or applicator will be removed, leaving the seeds in the prostate. The procedure may vary among health care providers and hospitals. What happens after the procedure?  Your blood pressure, heart rate, breathing rate, and blood oxygen level will be monitored until the medicines you were given have worn off.  Do not drive for 24 hours if you were given a sedative. Summary  Brachytherapy for prostate cancer is radiation treatment placed inside of the prostate (prostate gland).  There are several types of brachytherapy for prostate cancer, including low-dose temporary treatment, low-dose permanent treatment, and high-dose temporary treatment.  Temporary low-dose implants are left in the prostate for 1-7 days.  Permanent low-dose implants are injected into the prostate and left in place. They work for up to one year after they are inserted.  Permanent high-dose therapy is given through tubes that contain radioactive material. The tubes are removed after treatment, and no radiation is left in the prostate. This information is not intended  to replace advice given to you by your health care provider. Make sure you discuss any questions you have with your health care provider. Document Revised: 08/15/2017 Document Reviewed: 09/11/2016 Elsevier Patient Education  2020 [place] Laparoscopic Prostatectomy  Robot-assisted laparoscopic prostatectomy is a minimally invasive procedure to remove the entire prostate gland and the seminal vesicles, which are near the bladder and the prostate. This procedure is done to treat prostate cancer that has not spread to other parts of the body (has not metastasized). The goal of the procedure is to remove all cancer cells and to prevent prostate cancer from metastasizing. During your robot-assisted laparoscopic prostatectomy, your surgeon will use a thin, lighted tube with a tiny camera on the end (laparoscope). The laparoscope will allow your surgeon to do the surgery with several small incisions in the abdomen instead of a large incision. Your surgeon will also use a robotic arm during the procedure that will be operated through a computer. During your procedure, the lymph glands in your pelvis may be removed. Lymph glands are part of your body's disease-fighting system (immune system). The lymph glands in the pelvis may be the first place that is affected by the spreading (metastasis) of prostate cancer. If your pelvic lymph glands are removed, tissue from the glands will be tested for cancer cells. Tell a health care provider about:  Any allergies you have.  All medicines you are taking, including vitamins, herbs, eye drops, creams, and over-the-counter medicines.  Any problems you or family members have had with anesthetic medicines.  Any blood disorders you have.  Any surgeries you have had.  Any medical conditions you have.  Any prostate infections you have had. What are the risks? Generally, this is a safe procedure. However, problems may occur,  including:  Infection.  Bleeding.  Allergic reactions to medicines.  Inability to control when you urinate (incontinence). This is usually a temporary effect of this procedure.  Blockage (obstruction) of the large or small intestines.  Narrowing or scarring of the urethra (stricture), which may block the flow of urine.  Inability to get or sustain an erection (erectile dysfunction).  Dry ejaculation. This is when no semen is released during orgasm.  Blood clots in the legs.  The formation of a sac (cyst) in the pelvis that is filled with fluid from the lymph  glands (lymphocele). There is also a risk of damage to other structures or organs, such as:  The tubes that drain urine from the kidneys to the bladder (ureters).  The bladder.  The urethra.  The large or small intestines.  The rectum.  Vascular arteries or veins.  Nerve tissue. What happens before the procedure? Staying hydrated Follow instructions from your health care provider about hydration, which may include:  Up to 2 hours before the procedure - you may continue to drink clear liquids, such as water, clear fruit juice, black coffee, and plain tea. Eating and drinking restrictions Follow instructions from your health care provider about eating and drinking, which may include:  8 hours before the procedure - stop eating heavy meals or foods such as meat, fried foods, or fatty foods.  6 hours before the procedure - stop eating light meals or foods, such as toast or cereal.  6 hours before the procedure - stop drinking milk or drinks that contain milk.  2 hours before the procedure - stop drinking clear liquids. Medicines  Ask your health care provider about: ? Changing or stopping your regular medicines. This is especially important if you are taking diabetes medicines or blood thinners. ? Taking medicines such as aspirin and ibuprofen. These medicines can thin your blood. Do not take these medicines  before your procedure if your health care provider instructs you not to.  You may be given antibiotic medicine to help prevent infection. General instructions  Ask your health care provider how your surgical site will be marked or identified.  You may have an exam or testing.  You may have additional tests, such as a CT scan or MRI.  You may have a blood or urine sample taken. What happens during the procedure?  To reduce your risk of infection: ? Your health care team will wash or sanitize their hands. ? Your skin will be washed with soap. ? Hair will be removed from your surgical area.  An IV tube will be inserted into one of your veins.  You will be given one or more of the following: ? A medicine to help you relax (sedative). ? A medicine to make you fall asleep (general anesthetic). ? A medicine that is injected into your spine to numb the area below and slightly above the injection site (spinal anesthetic).  A thin, flexible tube (catheter) will be inserted through your urethra and into your bladder. The catheter will drain urine from your bladder during the procedure and while you heal.  Four or five small incisions will be made in your abdomen.  The laparoscope and other surgical instruments will be put through the incisions. Your surgeon will use the laparoscope and a robotic arm to help control the surgical instruments.  Your urethra will be cut and separated from your bladder, and your prostate and seminal vesicles will be removed.  Your pelvic lymph glands may also be removed for testing.  Your urethra will be reconnected to your bladder. This will be done so your catheter is still in place inside of your urethra.  A small tube (drain) may be placed in one or more of your incisions to help drain extra fluid from your surgical site.  Your incisions will be closed with stitches (sutures), skin glue, or adhesive strips.  Medicine may be applied to your  incisions.  Bandages (dressings) will be placed over your incisions. What happens after the procedure?  Your blood pressure, heart rate, breathing rate, and  blood oxygen level will be monitored until the medicines you were given have worn off.  You may continue to receive fluids and medicines through an IV tube.  You will have some pain. Pain medicines will be available to help you.  You will have a catheter to drain urine from your bladder.  You may have fluid coming from a drain in your incision.  You may have to wear compression stockings. These stockings help to prevent blood clots and reduce swelling in your legs.  You will be encouraged to move around as much as possible. This information is not intended to replace advice given to you by your health care provider. Make sure you discuss any questions you have with your health care provider. Document Revised: 08/15/2017 Document Reviewed: 06/14/2016 Elsevier Patient Education  2020 [employer].

## 2020-08-17 ENCOUNTER — Other Ambulatory Visit: Payer: Self-pay | Admitting: Family Medicine

## 2020-09-25 ENCOUNTER — Encounter: Payer: Self-pay | Admitting: Radiation Oncology

## 2020-09-25 NOTE — Progress Notes (Signed)
GU Location of Tumor / Histology: prostatic adenocarcinoma   If Prostate Cancer, Gleason Score is (3 + 4) and PSA is (4.6)  Ricardo Pacheco was referred for further evaluation of an elevated PSA.  05/24/2020 PSA  4.6 06/2019 PSA  2.5 05/2018  PSA  2.4  Biopsies of prostate (if applicable) revealed:   Past/Anticipated interventions by urology, if any: Prescribed tamsulosin, prostate biopsy, referral to Dr. Tammi Klippel to discuss radiation therapy options  Past/Anticipated interventions by medical oncology, if any: no  Weight changes, if any: Denies  Bowel/Bladder complaints, if any: IPSS 11. SHIM 21. Denies dysuria or hematuria. Reports urinary frequency, urgency, leakage and nocturia. Denies any bowel complaints.  Nausea/Vomiting, if any: denies  Pain issues, if any:  Takes hydrocodone intermittently for the management of chronic back pain when epidural wears off.   SAFETY ISSUES:  Prior radiation? denies  Pacemaker/ICD? denies  Possible current pregnancy? no, male patient  Is the patient on methotrexate? denies  Current Complaints / other details:  65 year old male. Married. 2 children. Has 2 brothers with prostate cancer and one brother with throat ca.

## 2020-09-26 ENCOUNTER — Encounter: Payer: Self-pay | Admitting: Radiation Oncology

## 2020-09-26 ENCOUNTER — Encounter: Payer: Self-pay | Admitting: Medical Oncology

## 2020-09-26 ENCOUNTER — Other Ambulatory Visit: Payer: Self-pay

## 2020-09-26 ENCOUNTER — Ambulatory Visit
Admission: RE | Admit: 2020-09-26 | Discharge: 2020-09-26 | Disposition: A | Discharge status: Home / Self Care | Payer: PRIVATE HEALTH INSURANCE | Source: Ambulatory Visit | Attending: Radiation Oncology | Admitting: Radiation Oncology

## 2020-09-26 VITALS — BP 123/79 | HR 70 | Temp 98.2°F | Resp 20 | Ht 72.0 in | Wt 216.6 lb

## 2020-09-26 DIAGNOSIS — Z791 Long term (current) use of non-steroidal anti-inflammatories (NSAID): Secondary | ICD-10-CM | POA: Insufficient documentation

## 2020-09-26 DIAGNOSIS — C61 Malignant neoplasm of prostate: Secondary | ICD-10-CM | POA: Diagnosis not present

## 2020-09-26 DIAGNOSIS — E7800 Pure hypercholesterolemia, unspecified: Secondary | ICD-10-CM | POA: Insufficient documentation

## 2020-09-26 DIAGNOSIS — Z87891 Personal history of nicotine dependence: Secondary | ICD-10-CM | POA: Insufficient documentation

## 2020-09-26 DIAGNOSIS — M129 Arthropathy, unspecified: Secondary | ICD-10-CM | POA: Insufficient documentation

## 2020-09-26 HISTORY — DX: Malignant neoplasm of prostate: C61

## 2020-09-26 NOTE — Progress Notes (Signed)
Radiation Oncology         [phone] ________________________________  Initial outpatient Consultation  Name: Ricardo Pacheco MRN: [id number]  Date: 09/26/2020  DOB: August 12, 1956  LN:[id number], [name], DO  Raynelle Bring, MD   REFERRING PHYSICIAN: Raynelle Bring, MD  DIAGNOSIS: 65 y.o. gentleman with Stage T1c adenocarcinoma of the prostate with Gleason score of 3+4, and PSA of 4.    ICD-10-CM   1. Malignant neoplasm of prostate ([place])  C61     HISTORY OF PRESENT ILLNESS: Ricardo Pacheco is a 65 y.o. male with a diagnosis of prostate cancer. He was noted to have an elevated PSA of 4.6 by his primary care physician, Dr. Lovena Le.  Accordingly, he was referred for evaluation in urology by Dr. Jeffie Pollock on 06/16/2020,  digital rectal examination was performed at that time revealing no nodules.  A repeat PSA performed on 06/21/2020 remained elevated at 4.  Therefore, the patient proceeded to transrectal ultrasound with 12 biopsies of the prostate on 06/30/2020.  The prostate volume measured 30.4 cc.  Out of 12 core biopsies, 10 were positive.  The maximum Gleason score was 3+4, and this was seen in the right mid lateral (small focus), left mid (with PNI), and left base lateral. Additionally, Gleason 3+3 was seen in the left mid lateral, left apex lateral (small focus), left apex (with PNI), right base (small focus), right apex, right base lateral, and the right apex lateral (small foci).  He met with Dr. Alinda Money on 08/22/2020 to discuss prostatectomy but at the end of their conversation was leaning towards brachytherapy.  The patient reviewed the biopsy results with his urologist and he has kindly been referred today for discussion of potential radiation treatment options.   PREVIOUS RADIATION THERAPY: No  PAST MEDICAL HISTORY:  Past Medical History:  Diagnosis Date  . Arthritis   . Decreased white blood cell count   . Hypercholesteremia   . Prostate cancer ([place])   . Prostate cancer ([place])        PAST SURGICAL HISTORY: Past Surgical History:  Procedure Laterality Date  . COLONOSCOPY    . COLONOSCOPY N/A 07/18/2017   Procedure: COLONOSCOPY;  Surgeon: Danie Binder, MD;  [facility];  Service: Endoscopy;  Laterality: N/A;  2:15 pm  . KNEE ARTHROPLASTY    . POLYPECTOMY  07/18/2017   Procedure: POLYPECTOMY;  Surgeon: Danie Binder, MD;  [facility];  Service: Endoscopy;;  hepatic flexure  . PROSTATE BIOPSY      FAMILY HISTORY:  Family History  Problem Relation Age of Onset  . Hypertension Mother   . Stroke Father   . Prostate cancer Brother   . Throat cancer Brother   . Prostate cancer Brother   . Colon cancer Neg Hx   . Colon polyps Neg Hx   . Breast cancer Neg Hx   . Pancreatic cancer Neg Hx     SOCIAL HISTORY:  Social History   Socioeconomic History  . Marital status: Married    Spouse name: Not on file  . Number of children: 2  . Years of education: Not on file  . Highest education level: Not on file  Occupational History  . Occupation: [place]    Comment: full time; plans to retire again in May  Tobacco Use  . Smoking status: Former Smoker    Packs/day: 1.00    Years: 28.00    Pack years: 28.00    Types: Cigarettes    Quit date: 09/16/2000  Years since quitting: 20.0  . Smokeless tobacco: Never Used  Vaping Use  . Vaping Use: Never used  Substance and Sexual Activity  . Alcohol use: No  . Drug use: No  . Sexual activity: Yes  Other Topics Concern  . Not on file  Social History Narrative  . Not on file   Social Determinants of Health   Financial Resource Strain: Not on file  Food Insecurity: Not on file  Transportation Needs: Not on file  Physical Activity: Not on file  Stress: Not on file  Social Connections: Not on file  Intimate Partner Violence: Not on file    ALLERGIES: Patient has no known allergies.  MEDICATIONS:  Current Outpatient Medications  Medication Sig Dispense Refill  . diclofenac  (VOLTAREN) 75 MG EC tablet Take 75 mg by mouth 2 (two) times daily.    Marland Kitchen HYDROcodone-acetaminophen (NORCO) 10-325 MG tablet Take 1 tablet by mouth every 6 (six) hours as needed for moderate pain., 60 tablet 0  . methocarbamol (ROBAXIN) 500 MG tablet Take 500 mg by mouth 4 (four) times daily.    . methylPREDNISolone (MEDROL DOSEPAK) 4 MG TBPK tablet Take by mouth.    . Multiple Vitamin (MULTIVITAMIN WITH MINERALS) TABS tablet Take 1 tablet by mouth daily.    . tamsulosin (FLOMAX) 0.4 MG CAPS capsule TAKE 1 CAPSULE BY MOUTH DAILY 30 capsule 2   No current facility-administered medications for this encounter.    REVIEW OF SYSTEMS:  On review of systems, the patient reports that he is doing well overall. He denies any chest pain, shortness of breath, cough, fevers, chills, night sweats, unintended weight changes. He denies any bowel disturbances, and denies abdominal pain, nausea or vomiting. He denies any new musculoskeletal or joint aches or pains. His IPSS was 11, indicating moderate urinary symptoms. He reports urinary urgency, frequency, leakage, and nocturia. His SHIM was 21, indicating he has some mild erectile dysfunction. A complete review of systems is obtained and is otherwise negative.    PHYSICAL EXAM:  Wt Readings from Last 3 Encounters:  09/26/20 216 lb 9.6 oz (98.2 kg)  07/28/20 208 lb (94.3 kg)  06/16/20 208 lb (94.3 kg)   Temp Readings from Last 3 Encounters:  09/26/20 98.2 F (36.8 C)  07/28/20 98.3 F (36.8 C)  06/16/20 98.9 F (37.2 C)   BP Readings from Last 3 Encounters:  09/26/20 123/79  07/28/20 119/74  06/16/20 132/80   Pulse Readings from Last 3 Encounters:  09/26/20 70  07/28/20 73  06/16/20 81   Pain Assessment Pain Score: 1  (Reports chronic back pain) Pain Frequency: Constant Pain Loc: Back/10  In general this is a well appearing African-American male in no acute distress. He's alert and oriented x4 and appropriate throughout the examination.  Cardiopulmonary assessment is negative for acute distress, and he exhibits normal effort.     KPS = 90  100 - Normal; no complaints; no evidence of disease. 90   - Able to carry on normal activity; minor signs or symptoms of disease. 80   - Normal activity with effort; some signs or symptoms of disease. 80   - Cares for self; unable to carry on normal activity or to do active work. 60   - Requires occasional assistance, but is able to care for most of his personal needs. 50   - Requires considerable assistance and frequent medical care. 52   - Disabled; requires special care and assistance. 30   - Severely disabled; hospital  admission is indicated although death not imminent. 21   - Very sick; hospital admission necessary; active supportive treatment necessary. 10   - Moribund; fatal processes progressing rapidly. 0     - Dead  Karnofsky DA, Abelmann Craig, Craver LS and [facility] [phone]) The use of the nitrogen mustards in the palliative treatment of carcinoma: with particular reference to bronchogenic carcinoma Cancer 1 634-56  LABORATORY DATA:  Lab Results  Component Value Date   WBC 3.7 05/24/2020   HGB 13.7 05/24/2020   HCT 44.0 05/24/2020   MCV 87 05/24/2020   PLT 349 05/24/2020   Lab Results  Component Value Date   NA 141 05/24/2020   K 4.3 05/24/2020   CL 105 05/24/2020   CO2 23 05/24/2020   Lab Results  Component Value Date   ALT 16 05/24/2020   AST 17 05/24/2020   ALKPHOS 69 05/24/2020   BILITOT 0.2 05/24/2020     RADIOGRAPHY: No results found.    IMPRESSION/PLAN: 1. 65 y.o. gentleman with Stage T1c adenocarcinoma of the prostate with Gleason Score of 3+4, and PSA of 4. We discussed the patient's workup and outlined the nature of prostate cancer in this setting. The patient's T stage, Gleason's score, and PSA put him into the favorable intermediate risk group. Accordingly, he is eligible for a variety of potential treatment options including brachytherapy, 5.5  weeks of external radiation, or prostatectomy. We discussed the available radiation techniques, and focused on the details and logistics of delivery. We discussed and outlined the risks, benefits, short and long-term effects associated with radiotherapy and compared and contrasted these with prostatectomy. We discussed the role of SpaceOAR gel in reducing the rectal toxicity associated with radiotherapy.  He appears to have a good understanding of his disease and our treatment recommendations which are of curative intent.  He was encouraged to ask questions that were answered to his stated satisfaction.  At the conclusion of our conversation, the patient is interested in moving forward with brachytherapy and use of SpaceOAR gel to reduce rectal toxicity from radiotherapy.  We will share our discussion with Dr. Jeffie Pollock and move forward with scheduling his [facility] planning appointment in the near future.  The patient met briefly with Romie Jumper in our office who will be working closely with him to coordinate OR scheduling and pre and post procedure appointments.  We will contact the pharmaceutical rep to ensure that [employer] is available at the time of procedure.  We enjoyed meeting him today and look forward to continuing to participate in his care.    Nicholos Johns, PA-C    Tyler Pita, MD  [facility] Direct Dial: [phone]  Fax: [phone] [email or web address]  Skype  [email or web address]   This document serves as a record of services personally performed by Tyler Pita, MD and Freeman Caldron, PA-C. It was created on their behalf by Wilburn Mylar, a trained medical scribe. The creation of this record is based on the scribe's personal observations and the provider's statements to them. This document has been checked and approved by the attending provider.

## 2020-09-26 NOTE — Progress Notes (Signed)
Introduced myself to patient as the prostate nurse navigator. He has two brothers that have had prostate cancer. One was treated with brachytherapy and the other with external beam.  He is leaning towards brachytherapy. I gave him my business card and asked him to call me with questions or concerns. He voiced understanding.

## 2020-09-27 ENCOUNTER — Other Ambulatory Visit: Payer: Self-pay | Admitting: Urology

## 2020-09-27 DIAGNOSIS — C61 Malignant neoplasm of prostate: Secondary | ICD-10-CM

## 2020-09-28 ENCOUNTER — Telehealth: Payer: Self-pay | Admitting: *Deleted

## 2020-09-28 NOTE — Telephone Encounter (Signed)
Called patient to inform of pre-seed appts. for 10-26-20 and his implant for 11-24-20, spoke with patient and he is aware of these appts.

## 2020-09-29 ENCOUNTER — Ambulatory Visit (HOSPITAL_COMMUNITY): Payer: PRIVATE HEALTH INSURANCE | Attending: Orthopedic Surgery | Admitting: Physical Therapy

## 2020-09-29 ENCOUNTER — Encounter (HOSPITAL_COMMUNITY): Payer: Self-pay | Admitting: Physical Therapy

## 2020-09-29 ENCOUNTER — Other Ambulatory Visit: Payer: Self-pay

## 2020-09-29 DIAGNOSIS — M5442 Lumbago with sciatica, left side: Secondary | ICD-10-CM

## 2020-09-29 DIAGNOSIS — R2689 Other abnormalities of gait and mobility: Secondary | ICD-10-CM

## 2020-09-29 DIAGNOSIS — M6281 Muscle weakness (generalized): Secondary | ICD-10-CM | POA: Diagnosis present

## 2020-09-29 NOTE — Therapy (Signed)
[place], [place], [address] Phone: [phone]   Fax:  [phone]  Physical Therapy Evaluation  Patient Details  Name: Ricardo Pacheco MRN: [id number] Date of Birth: 1956-08-18 Referring Provider (PT): Melina Schools, MD   Encounter Date: 09/29/2020   PT End of Session - 09/29/20 1533    Visit Number 1    Number of Visits 12    Date for PT Re-Evaluation 11/10/20    Authorization Type [employer], visit limit 44    Authorization - Visit Number 1    Authorization - Number of Visits 60    Progress Note Due on Visit 10    PT Start Time 5852    PT Stop Time 1611    PT Time Calculation (min) 38 min    Activity Tolerance Patient tolerated treatment well    Behavior During Therapy [facility] for tasks assessed/performed           Past Medical History:  Diagnosis Date  . Arthritis   . Decreased white blood cell count   . Hypercholesteremia   . Prostate cancer ([place])   . Prostate cancer [facility])     Past Surgical History:  Procedure Laterality Date  . COLONOSCOPY    . COLONOSCOPY N/A 07/18/2017   Procedure: COLONOSCOPY;  Surgeon: Danie Binder, MD;  [facility];  Service: Endoscopy;  Laterality: N/A;  2:15 pm  . KNEE ARTHROPLASTY    . POLYPECTOMY  07/18/2017   Procedure: POLYPECTOMY;  Surgeon: Danie Binder, MD;  [facility];  Service: Endoscopy;;  hepatic flexure  . PROSTATE BIOPSY      There were no vitals filed for this visit.    Subjective Assessment - 09/29/20 1621    Subjective Patient presents to PT with increased pain beginning on Sep 16 2020 after COIVD-19 booster shot. He reports history of degenerative disc disease which he recieved shots for every few months. He reports that he woke up after his booster shot and began having severe back pain and after a few days it began to radiate down to his groin on his L side. He reports that it eases with sitting and is worsened by standing or  walking.    Pertinent History prostate cancer, arthritis    Limitations Lifting;Standing;Walking    How long can you stand comfortably? <5 min before pain    How long can you walk comfortably? 10 seconds before pain    Patient Stated Goals decrease pain with work (ambulation)    Currently in Pain? Yes    Pain Score 4     Pain Location Back    Pain Orientation Left;Posterior    Pain Descriptors / Indicators Aching;Constant;Discomfort;Stabbing    Pain Radiating Towards L groin    Pain Onset 1 to 4 weeks ago    Pain Frequency Constant    Aggravating Factors  standing, walking    Pain Relieving Factors sitting, resting              [facility] PT Assessment - 09/29/20 0001      Assessment   Medical Diagnosis Low Back Pain Unspecified    Referring Provider (PT) Melina Schools, MD    Onset Date/Surgical Date 09/16/20      Restrictions   Weight Bearing Restrictions No      Balance Screen   Has the patient fallen in the past 6 months No      [place]  residence      Prior Function   Level of Independence Independent    Vocation Full time employment      Observation/Other Assessments   Focus on Therapeutic Outcomes (FOTO)  54.3% function      ROM / Strength   AROM / PROM / Strength AROM      AROM   Overall AROM  Deficits    AROM Assessment Site Lumbar    Lumbar Flexion 75% restricted    Lumbar Extension 100% restricted    Lumbar - Right Side Bend 50% restricted    Lumbar - Left Side Bend 50% restricted    Lumbar - Right Rotation --   pelvic compensations     Special Tests   Other special tests repeated flexion eased radicular symptoms      Transfers   Five time sit to stand comments  16.8 sec      Ambulation/Gait   Ambulation/Gait Yes    Ambulation/Gait Assistance 7: Independent    Ambulation Distance (Feet) 396 Feet   ft in 2MWT, pain initiate at 10 sec and worsen as progress   Gait Pattern Step-through pattern;Decreased arm  swing - right    Gait velocity decreased                      Objective measurements completed on examination: See above findings.       [facility] Adult PT Treatment/Exercise - 09/29/20 0001      Posture/Postural Control   Posture/Postural Control Postural limitations    Postural Limitations Rounded Shoulders;Decreased lumbar lordosis      Exercises   Exercises Lumbar                  PT Education - 09/29/20 1655    Education Details PT scope, POC, repeated flexion    Person(s) Educated Patient    Methods Explanation;Demonstration    Comprehension Verbalized understanding            PT Short Term Goals - 09/29/20 1706      PT SHORT TERM GOAL #1   Title Patient will report at least 25% improvement in symptoms for improved quality of life.    Time 3    Period Weeks    Status New    Target Date 10/20/20      PT SHORT TERM GOAL #2   Title Patient will be independent in self management strategies to improve quality of life and functional outcomes.    Time 3    Period Weeks    Status New      PT SHORT TERM GOAL #3   Title Patient will ambulate 5 minutes before radicular pain begins allowing for improved functional mobility.    Baseline 10 seconds before pain    Time 3    Period Weeks    Status New             PT Long Term Goals - 09/29/20 1707      PT LONG TERM GOAL #1   Title Patient will report at least 50% improvement in symptoms for improved quality of life.    Time 6    Period Weeks    Status New    Target Date 11/10/20      PT LONG TERM GOAL #2   Title Patient will improve FOTO score in projected manner to demonstrate improvement in function and mobility.    Time 6    Period Weeks    Status  New      PT LONG TERM GOAL #3   Title Patient will ambulate for 10 minutes before radicular pain begins to allow for improved work function and improvement in functional mobility.    Time 6    Period Weeks    Status New                   Plan - 09/29/20 1619    Clinical Impression Statement Patient presents to PT with c/o radiating LBP at began after COVID booster shot and radiates down to his L groin. He presents with increased difficulty with ambulation and standing without pain. Improved pain with lumbar flexion, but limitation in lumbar mobility globally. No functional strength at this time, but not formally tested secondary to time. Neural tension not assessed at this time, continue to assess differentiation in spinal mobility, neural glides, and muscle limitations. Will benefit from skilled PT to address pain and limitaitons impacting ability to complete ADLs.    Personal Factors and Comorbidities Comorbidity 2    Comorbidities arthritis, history of DDD    Examination-Activity Limitations Bend;Carry;Stand;Stairs;Locomotion Level;Transfers    Examination-Participation Restrictions Driving;Yard Work;Occupation    Stability/Clinical Decision Making Stable/Uncomplicated    Clinical Decision Making Low    Rehab Potential Good    PT Frequency 2x / week    PT Duration 6 weeks    PT Treatment/Interventions ADLs/Self Care Home Management;Aquatic Therapy;Cryotherapy;Electrical Stimulation;Traction;Moist Heat;Iontophoresis 4mg /ml Dexamethasone;Gait training;Stair training;Functional mobility training;Therapeutic activities;Therapeutic exercise;Balance training;Neuromuscular re-education;Manual techniques;Patient/family education;Passive range of motion;Dry needling;Energy conservation;Joint Manipulations;Spinal Manipulations;Taping    PT Next Visit Plan ASSESS: Neural tension, muscular tension, spinal mobility, repeated motions. TREAT: lumbar ROM deficits, strength    PT Home Exercise Plan repeated flexion    Consulted and Agree with Plan of Care Patient           Patient will benefit from skilled therapeutic intervention in order to improve the following deficits and impairments:  Abnormal gait,Decreased  mobility,Decreased strength,Hypomobility,Decreased range of motion,Difficulty walking,Pain  Visit Diagnosis: Left-sided low back pain with left-sided sciatica, unspecified chronicity  Muscle weakness (generalized)  Other abnormalities of gait and mobility     Problem List Patient Active Problem List   Diagnosis Date Noted  . Malignant neoplasm of prostate ([place]) 09/26/2020  . Wellness examination 05/29/2020  . Elevated PSA 05/29/2020  . Pain in left knee 02/16/2020  . Acquired trigger finger of left little finger 05/14/2019  . Degeneration of lumbar intervertebral disc 12/05/2017  . Acquired trigger finger of right middle finger 10/10/2017  . Acquired trigger finger of right ring finger 10/10/2017  . Polyp of hepatic flexure of colon   . Prostate hypertrophy 12/11/2012  . Plantar fasciitis 12/11/2012  . Chronic back pain 12/11/2012    5:20 PM,09/29/20 Domenic Moras, PT, DPT Physical Therapist at [place], [place], [address] Phone: [phone]   Fax:  [phone]  Name: Ricardo Pacheco MRN: [id number] Date of Birth: 1955-10-01

## 2020-10-04 ENCOUNTER — Ambulatory Visit (HOSPITAL_COMMUNITY): Payer: PRIVATE HEALTH INSURANCE

## 2020-10-04 ENCOUNTER — Ambulatory Visit (HOSPITAL_COMMUNITY): Payer: PRIVATE HEALTH INSURANCE | Admitting: Physical Therapy

## 2020-10-04 ENCOUNTER — Encounter (HOSPITAL_COMMUNITY): Payer: Self-pay

## 2020-10-04 ENCOUNTER — Other Ambulatory Visit: Payer: Self-pay

## 2020-10-04 DIAGNOSIS — M5442 Lumbago with sciatica, left side: Secondary | ICD-10-CM | POA: Diagnosis not present

## 2020-10-04 DIAGNOSIS — R2689 Other abnormalities of gait and mobility: Secondary | ICD-10-CM

## 2020-10-04 DIAGNOSIS — M6281 Muscle weakness (generalized): Secondary | ICD-10-CM

## 2020-10-04 NOTE — Patient Instructions (Addendum)
Knee to Chest (Flexion)    Pull knee toward chest, may use towel for assistance. Feel stretch in lower back or buttock area. Breathing deeply, Hold 30 seconds. Repeat with other knee. Repeat 3 times. Do 2 sessions per day.  http://gt2.exer.us/225   Copyright  VHI. All rights reserved.   Lower Trunk Rotation Stretch    Keeping back flat and feet together, rotate knees to left side. Hold 10 seconds. Repeat 5 times per set. Do 2 sets per day.  http://orth.exer.us/122   Copyright  VHI. All rights reserved.

## 2020-10-04 NOTE — Therapy (Signed)
[place], [place], [address] Phone: [phone]   Fax:  [phone]  Physical Therapy Treatment  Patient Details  Name: Ricardo Pacheco MRN: [id number] Date of Birth: [birth year] Referring Provider (PT): Melina Schools, MD   Encounter Date: 10/04/2020   PT End of Session - 10/04/20 1710    Visit Number 2    Number of Visits 12    Date for PT Re-Evaluation 11/10/20    Authorization Type [employer], visit limit 16    Authorization - Visit Number 2    Authorization - Number of Visits 60    Progress Note Due on Visit 10    PT Start Time 1702    PT Stop Time 1740    PT Time Calculation (min) 38 min    Activity Tolerance Patient tolerated treatment well;Patient limited by pain;No increased pain    Behavior During Therapy WFL for tasks assessed/performed           Past Medical History:  Diagnosis Date  . Arthritis   . Decreased white blood cell count   . Hypercholesteremia   . Prostate cancer ([place])   . Prostate cancer [facility])     Past Surgical History:  Procedure Laterality Date  . COLONOSCOPY    . COLONOSCOPY N/A 07/18/2017   Procedure: COLONOSCOPY;  Surgeon: Danie Binder, MD;  [facility];  Service: Endoscopy;  Laterality: N/A;  2:15 pm  . KNEE ARTHROPLASTY    . POLYPECTOMY  07/18/2017   Procedure: POLYPECTOMY;  Surgeon: Danie Binder, MD;  [facility];  Service: Endoscopy;;  hepatic flexure  . PROSTATE BIOPSY      There were no vitals filed for this visit.   Subjective Assessment - 10/04/20 1705    Subjective Pt reports LBP is minimal, main discomfort with intermittent radicular symptoms down Lt anterior groin ending above knee.  Reports 2 falls on ice, was able to get back up independently.    Pertinent History prostate cancer, arthritis    Patient Stated Goals decrease pain with work (ambulation)    Currently in Pain? Yes    Pain Score 4     Pain Location Back    Pain  Orientation Left;Anterior;Medial;Posterior    Pain Descriptors / Indicators Stabbing;Aching;Burning    Pain Type Chronic pain    Pain Radiating Towards Lt groin    Pain Onset 1 to 4 weeks ago    Pain Frequency Intermittent    Aggravating Factors  standing, walking    Pain Relieving Factors sitting, resting              [facility] PT Assessment - 10/04/20 0001      ROM / Strength   AROM / PROM / Strength Strength      AROM   AROM Assessment Site Hip    Right/Left Hip Right;Left    Right Hip Extension 20    Left Hip Extension 8      Strength   Strength Assessment Site Hip;Knee;Ankle    Right/Left Hip Right;Left    Right Hip Flexion 5/5    Right Hip Extension 4+/5    Right Hip ABduction 4+/5    Left Hip Flexion 4+/5   pain   Left Hip Extension 3+/5   limited range   Left Hip ABduction 4/5    Right/Left Knee Right;Left    Right Knee Flexion 5/5    Right Knee Extension 5/5    Left Knee Flexion 4/5  Left Knee Extension 4/5   pain   Right/Left Ankle Right;Left    Right Ankle Dorsiflexion 5/5    Right Ankle Plantar Flexion --   20x with 1 UE support   Left Ankle Dorsiflexion 5/5    Left Ankle Plantar Flexion 4+/5   20x, cueing to keep knee straight 1 UE support pain last 5 reps                        [facility] Adult PT Treatment/Exercise - 10/04/20 0001      Exercises   Exercises Lumbar      Lumbar Exercises: Stretches   Single Knee to Chest Stretch 3 reps;30 seconds    Single Knee to Chest Stretch Limitations towel assistance    Double Knee to Chest Stretch 1 rep    Double Knee to Chest Stretch Limitations increased Lt groin pain    Lower Trunk Rotation 5 reps;10 seconds    Other Lumbar Stretch Exercise Flexion repeated movements 10x                  PT Education - 10/04/20 1711    Education Details Reviewed goals, educated importance of HEP compliance for maximal benefits.  Pt able to recall and demonstrate forward flexion based exercise with  reports of relief in back.    Person(s) Educated Patient    Methods Explanation;Demonstration    Comprehension Verbalized understanding;Returned demonstration            PT Short Term Goals - 09/29/20 1706      PT SHORT TERM GOAL #1   Title Patient will report at least 25% improvement in symptoms for improved quality of life.    Time 3    Period Weeks    Status New    Target Date 10/20/20      PT SHORT TERM GOAL #2   Title Patient will be independent in self management strategies to improve quality of life and functional outcomes.    Time 3    Period Weeks    Status New      PT SHORT TERM GOAL #3   Title Patient will ambulate 5 minutes before radicular pain begins allowing for improved functional mobility.    Baseline 10 seconds before pain    Time 3    Period Weeks    Status New             PT Long Term Goals - 09/29/20 1707      PT LONG TERM GOAL #1   Title Patient will report at least 50% improvement in symptoms for improved quality of life.    Time 6    Period Weeks    Status New    Target Date 11/10/20      PT LONG TERM GOAL #2   Title Patient will improve FOTO score in projected manner to demonstrate improvement in function and mobility.    Time 6    Period Weeks    Status New      PT LONG TERM GOAL #3   Title Patient will ambulate for 10 minutes before radicular pain begins to allow for improved work function and improvement in functional mobility.    Time 6    Period Weeks    Status New                 Plan - 10/04/20 1729    Clinical Impression Statement Reviewed goals, educated importance of HEP compliance  for maximal benefits, pt able to recall and demonstrate appropriate mechanics wiht repeated flexion based exercise.  Objective testing complete to assess LE strength, noted pain with flexoin and extension based MMT and decreased Lt hip extension.  Added lumbar mobility stretches this session, pt reports some relief following with  mobility, given printout of stretches to add to HEP.    Personal Factors and Comorbidities Comorbidity 2    Comorbidities arthritis, history of DDD    Examination-Activity Limitations Bend;Carry;Stand;Stairs;Locomotion Level;Transfers    Examination-Participation Restrictions Driving;Yard Work;Occupation    Stability/Clinical Decision Making Stable/Uncomplicated    Clinical Decision Making Low    Rehab Potential Good    PT Frequency 2x / week    PT Duration 6 weeks    PT Treatment/Interventions ADLs/Self Care Home Management;Aquatic Therapy;Cryotherapy;Electrical Stimulation;Traction;Moist Heat;Iontophoresis 4mg /ml Dexamethasone;Gait training;Stair training;Functional mobility training;Therapeutic activities;Therapeutic exercise;Balance training;Neuromuscular re-education;Manual techniques;Patient/family education;Passive range of motion;Dry needling;Energy conservation;Joint Manipulations;Spinal Manipulations;Taping    PT Next Visit Plan ASSESS: Neural tension, muscular tension, spinal mobility, repeated motions. TREAT: lumbar ROM deficits, strength    PT Home Exercise Plan repeated flexion; 10/04/20: SKTC/LTR           Patient will benefit from skilled therapeutic intervention in order to improve the following deficits and impairments:  Abnormal gait,Decreased mobility,Decreased strength,Hypomobility,Decreased range of motion,Difficulty walking,Pain  Visit Diagnosis: Left-sided low back pain with left-sided sciatica, unspecified chronicity  Muscle weakness (generalized)  Other abnormalities of gait and mobility     Problem List Patient Active Problem List   Diagnosis Date Noted  . Malignant neoplasm of prostate ([place]) 09/26/2020  . Wellness examination 05/29/2020  . Elevated PSA 05/29/2020  . Pain in left knee 02/16/2020  . Acquired trigger finger of left little finger 05/14/2019  . Degeneration of lumbar intervertebral disc 12/05/2017  . Acquired trigger finger of right middle  finger 10/10/2017  . Acquired trigger finger of right ring finger 10/10/2017  . Polyp of hepatic flexure of colon   . Prostate hypertrophy 12/11/2012  . Plantar fasciitis 12/11/2012  . Chronic back pain 12/11/2012   Ihor Austin, LPTA/CLT; CBIS [phone]  Aldona Lento 10/04/2020, 5:45 PM  [place] [address] [place], [place], [address] Phone: [phone]   Fax:  [phone]  Name: Ricardo Pacheco MRN: [id number] Date of Birth: 1955/09/18

## 2020-10-12 ENCOUNTER — Ambulatory Visit (HOSPITAL_COMMUNITY): Payer: PRIVATE HEALTH INSURANCE | Admitting: Physical Therapy

## 2020-10-12 ENCOUNTER — Encounter (HOSPITAL_COMMUNITY): Payer: Self-pay | Admitting: Physical Therapy

## 2020-10-12 ENCOUNTER — Other Ambulatory Visit: Payer: Self-pay

## 2020-10-12 DIAGNOSIS — R2689 Other abnormalities of gait and mobility: Secondary | ICD-10-CM

## 2020-10-12 DIAGNOSIS — M5442 Lumbago with sciatica, left side: Secondary | ICD-10-CM

## 2020-10-12 DIAGNOSIS — M6281 Muscle weakness (generalized): Secondary | ICD-10-CM

## 2020-10-12 NOTE — Patient Instructions (Signed)
Access Code: [place] URL: [email or web address] Date: 10/12/2020 Prepared by: Josue Hector  Exercises Hooklying Isometric Hip Abduction with Belt - 2-3 x daily - 7 x weekly - 1 sets - 10 reps - 5 second hold Supine Hip Adduction Isometric with Ball - 2-3 x daily - 7 x weekly - 1 sets - 10 reps - second hold Supine Bridge - 2-3 x daily - 7 x weekly - 1 sets - 10 reps - second hold

## 2020-10-12 NOTE — Therapy (Signed)
Ricardo Pacheco, [place], [address] Phone: [phone]   Fax:  [phone]  Physical Therapy Treatment  Patient Details  Name: Ricardo Pacheco MRN: [id number] Date of Birth: 1956-04-24 Referring Provider (PT): Melina Schools, MD   Encounter Date: 10/12/2020   PT End of Session - 10/12/20 1736    Visit Number 3    Number of Visits 12    Date for PT Re-Evaluation 11/10/20    Authorization Type [employer], visit limit 5    Authorization - Visit Number 3    Authorization - Number of Visits 60    Progress Note Due on Visit 10    PT Start Time 3500    PT Stop Time 1810    PT Time Calculation (min) 40 min    Activity Tolerance Patient tolerated treatment well    Behavior During Therapy [facility] for tasks assessed/performed           Past Medical History:  Diagnosis Date  . Arthritis   . Decreased white blood cell count   . Hypercholesteremia   . Prostate cancer ([place])   . Prostate cancer [facility])     Past Surgical History:  Procedure Laterality Date  . COLONOSCOPY    . COLONOSCOPY N/A 07/18/2017   Procedure: COLONOSCOPY;  Surgeon: Ricardo Binder, MD;  [facility];  Service: Endoscopy;  Laterality: N/A;  2:15 pm  . KNEE ARTHROPLASTY    . POLYPECTOMY  07/18/2017   Procedure: POLYPECTOMY;  Surgeon: Ricardo Binder, MD;  [facility];  Service: Endoscopy;;  hepatic flexure  . PROSTATE BIOPSY      There were no vitals filed for this visit.   Subjective Assessment - 10/12/20 1735    Subjective Patient says he was doing alright until today. 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    Pertinent History prostate cancer, arthritis    Patient Stated Goals decrease pain with work (ambulation)    Currently in Pain? Yes    Pain Score 6     Pain Location Back    Pain Orientation Posterior;Lower    Pain Descriptors /  Indicators Aching    Pain Type Chronic pain    Pain Onset 1 to 4 weeks ago    Pain Frequency Intermittent                             [facility] Adult PT Treatment/Exercise - 10/12/20 0001      Lumbar Exercises: Stretches   Single Knee to Chest Stretch Right;Left;5 reps;10 seconds    Double Knee to Chest Stretch 3 reps;10 seconds    Double Knee to Chest Stretch Limitations increased radicular back pain laterally    Prone on Elbows Stretch 3 reps;60 seconds    Prone on Elbows Stretch Limitations no effect    Press Ups 2 reps;10 reps    Press Ups Limitations no effect      Lumbar Exercises: Supine   Bridge 10 reps;5 seconds    Other Supine Lumbar Exercises isometric hip abduction/ adduction 10 x 5"                    PT Short Term Goals - 09/29/20 1706      PT SHORT TERM GOAL #1   Title Patient will report at  least 25% improvement in symptoms for improved quality of life.    Time 3    Period Weeks    Status New    Target Date 10/20/20      PT SHORT TERM GOAL #2   Title Patient will be independent in self management strategies to improve quality of life and functional outcomes.    Time 3    Period Weeks    Status New      PT SHORT TERM GOAL #3   Title Patient will ambulate 5 minutes before radicular pain begins allowing for improved functional mobility.    Baseline 10 seconds before pain    Time 3    Period Weeks    Status New             PT Long Term Goals - 09/29/20 1707      PT LONG TERM GOAL #1   Title Patient will report at least 50% improvement in symptoms for improved quality of life.    Time 6    Period Weeks    Status New    Target Date 11/10/20      PT LONG TERM GOAL #2   Title Patient will improve FOTO score in projected manner to demonstrate improvement in function and mobility.    Time 6    Period Weeks    Status New      PT LONG TERM GOAL #3   Title Patient will ambulate for 10 minutes before radicular pain begins to  allow for improved work function and improvement in functional mobility.    Time 6    Period Weeks    Status New                 Plan - 10/12/20 1810    Clinical Impression Statement Patient with fair tolerance at todays session. Completed further assessment for LT sided low back pain and LT groin. Patient appears to have increased pain and peripheralization with supine flexion based activity. He reports no effect of prone on elbows, and no effect when progressed to prone press ups. Upon palpation, note patient with mod tenderness pinpoint about LT SI joint sulcus. Assessed for leg length discrepancy with patient in supine and found LLE to be approximately  shorter than RL. Educated patient on and performed MET hip abduction/ adduction series. Patient reports slight decrease in lumbar pain, but anterior LT hip/ groin pain remains the same. Issued patient self MET for HEP. Will reassess next visit, possible traction to LT LE for hip arthritis and continued glute strengthening for reduce pain and improved function.    Personal Factors and Comorbidities Comorbidity 2    Comorbidities arthritis, history of DDD    Examination-Activity Limitations Bend;Carry;Stand;Stairs;Locomotion Level;Transfers    Examination-Participation Restrictions Driving;Yard Work;Occupation    Stability/Clinical Decision Making Stable/Uncomplicated    Rehab Potential Good    PT Frequency 2x / week    PT Duration 6 weeks    PT Treatment/Interventions ADLs/Self Care Home Management;Aquatic Therapy;Cryotherapy;Electrical Stimulation;Traction;Moist Heat;Iontophoresis 65m/ml Dexamethasone;Gait training;Stair training;Functional mobility training;Therapeutic activities;Therapeutic exercise;Balance training;Neuromuscular re-education;Manual techniques;Patient/family education;Passive range of motion;Dry needling;Energy conservation;Joint Manipulations;Spinal Manipulations;Taping    PT Next Visit Plan Assess response to updated  HEP. If no change trial LLE femoral nerve glide, if no better try LAD for LT hip pain    PT Home Exercise Plan repeated flexion; 10/04/20: SKTC/LTR 1/27 iso hip abduction/ adduction, bridge    Consulted and Agree with Plan of Care Patient  Patient will benefit from skilled therapeutic intervention in order to improve the following deficits and impairments:  Abnormal gait,Decreased mobility,Decreased strength,Hypomobility,Decreased range of motion,Difficulty walking,Pain  Visit Diagnosis: Left-sided low back pain with left-sided sciatica, unspecified chronicity  Muscle weakness (generalized)  Other abnormalities of gait and mobility     Problem List Patient Active Problem List   Diagnosis Date Noted  . Malignant neoplasm of prostate ([place]) 09/26/2020  . Wellness examination 05/29/2020  . Elevated PSA 05/29/2020  . Pain in left knee 02/16/2020  . Acquired trigger finger of left little finger 05/14/2019  . Degeneration of lumbar intervertebral disc 12/05/2017  . Acquired trigger finger of right middle finger 10/10/2017  . Acquired trigger finger of right ring finger 10/10/2017  . Polyp of hepatic flexure of colon   . Prostate hypertrophy 12/11/2012  . Plantar fasciitis 12/11/2012  . Chronic back pain 12/11/2012   6:28 PM, 10/12/20 Josue Hector PT DPT  Physical Therapist with [facility]  [phone]   [facility] [address] [place], [place], [address] Phone: [phone]   Fax:  [phone]  Name: Artavious Trebilcock MRN: [id number] Date of Birth: March 01, 1956

## 2020-10-17 ENCOUNTER — Encounter (HOSPITAL_COMMUNITY): Payer: Self-pay

## 2020-10-17 ENCOUNTER — Ambulatory Visit (HOSPITAL_COMMUNITY): Payer: PRIVATE HEALTH INSURANCE | Attending: Orthopedic Surgery

## 2020-10-17 ENCOUNTER — Other Ambulatory Visit: Payer: Self-pay

## 2020-10-17 DIAGNOSIS — R2689 Other abnormalities of gait and mobility: Secondary | ICD-10-CM | POA: Insufficient documentation

## 2020-10-17 DIAGNOSIS — M6281 Muscle weakness (generalized): Secondary | ICD-10-CM | POA: Insufficient documentation

## 2020-10-17 DIAGNOSIS — M5442 Lumbago with sciatica, left side: Secondary | ICD-10-CM | POA: Insufficient documentation

## 2020-10-17 NOTE — Therapy (Signed)
[place], [place], [address] Phone: [phone]   Fax:  [phone]  Physical Therapy Treatment  Patient Details  Name: Ricardo Pacheco MRN: [id number] Date of Birth: Nov 22, 1955 Referring Provider (PT): Melina Schools, MD   Encounter Date: 10/17/2020   PT End of Session - 10/17/20 1726    Visit Number 4    Number of Visits 12    Date for PT Re-Evaluation 11/10/20    Authorization Type [employer], visit limit 44    Authorization - Visit Number 4    Authorization - Number of Visits 60    Progress Note Due on Visit 10    PT Start Time 1702    PT Stop Time 1742    PT Time Calculation (min) 40 min    Activity Tolerance Patient tolerated treatment well    Behavior During Therapy [facility] for tasks assessed/performed           Past Medical History:  Diagnosis Date  . Arthritis   . Decreased white blood cell count   . Hypercholesteremia   . Prostate cancer ([place])   . Prostate cancer [facility])     Past Surgical History:  Procedure Laterality Date  . COLONOSCOPY    . COLONOSCOPY N/A 07/18/2017   Procedure: COLONOSCOPY;  Surgeon: Danie Binder, MD;  [facility];  Service: Endoscopy;  Laterality: N/A;  2:15 pm  . KNEE ARTHROPLASTY    . POLYPECTOMY  07/18/2017   Procedure: POLYPECTOMY;  Surgeon: Danie Binder, MD;  [facility];  Service: Endoscopy;;  hepatic flexure  . PROSTATE BIOPSY      There were no vitals filed for this visit.   Subjective Assessment - 10/17/20 1703    Subjective Pt reports pain increases with standing and bending forward during the day, pain in Lt groin and minimal in back today.  Positive reports with additional HEP.    Pertinent History prostate cancer, arthritis    Patient Stated Goals decrease pain with work (ambulation)    Currently in Pain? Yes    Pain Score 3     Pain Location Groin    Pain Orientation Left;Proximal;Medial    Pain Descriptors / Indicators Aching     Pain Type Chronic pain    Pain Radiating Towards Lt groin    Pain Onset 1 to 4 weeks ago    Pain Frequency Intermittent   increased pain later in day, bending forward and walking dynamic surface   Aggravating Factors  standing, walking    Pain Relieving Factors sitting, resting                             [facility] Adult PT Treatment/Exercise - 10/17/20 0001      Bed Mobility   Bed Mobility Left Sidelying to Sit    Left Sidelying to Sit Supervision/Verbal cueing   Educated on log rolling     Exercises   Exercises Lumbar      Lumbar Exercises: Stretches   Active Hamstring Stretch Right;Left;1 rep;2 reps;30 seconds    Active Hamstring Stretch Limitations long sitting x 1, supine with rope x 2      Lumbar Exercises: Seated   Other Seated Lumbar Exercises tall sitting    Other Seated Lumbar Exercises anterior/posterior pelvic tilt      Lumbar Exercises: Supine   Ab Set 10 reps;3 seconds    Pelvic Tilt 10 reps;5 seconds  Pelvic Tilt Limitations posterior pelvic tilt    Bridge 10 reps;5 seconds    Other Supine Lumbar Exercises isometric hip abduction/ adduction 10 x 5"                    PT Short Term Goals - 09/29/20 1706      PT SHORT TERM GOAL #1   Title Patient will report at least 25% improvement in symptoms for improved quality of life.    Time 3    Period Weeks    Status New    Target Date 10/20/20      PT SHORT TERM GOAL #2   Title Patient will be independent in self management strategies to improve quality of life and functional outcomes.    Time 3    Period Weeks    Status New      PT SHORT TERM GOAL #3   Title Patient will ambulate 5 minutes before radicular pain begins allowing for improved functional mobility.    Baseline 10 seconds before pain    Time 3    Period Weeks    Status New             PT Long Term Goals - 09/29/20 1707      PT LONG TERM GOAL #1   Title Patient will report at least 50% improvement in  symptoms for improved quality of life.    Time 6    Period Weeks    Status New    Target Date 11/10/20      PT LONG TERM GOAL #2   Title Patient will improve FOTO score in projected manner to demonstrate improvement in function and mobility.    Time 6    Period Weeks    Status New      PT LONG TERM GOAL #3   Title Patient will ambulate for 10 minutes before radicular pain begins to allow for improved work function and improvement in functional mobility.    Time 6    Period Weeks    Status New                 Plan - 10/17/20 1729    Clinical Impression Statement Educated log rolling to reduce lumbar strain.  Pt reports positive results following current HEP so continued with extension based exercises.  Checked SI alignment with no manual MET required.  Pt educated on proper sitting with noted posterior pelvic tilt.  Added hamstring stretch to assist with good seated posture.  Also discussed benefits iwth 1/4in heel support following last session findings.  No reports of increased pain through session.    Personal Factors and Comorbidities Comorbidity 2    Comorbidities arthritis, history of DDD    Examination-Activity Limitations Bend;Carry;Stand;Stairs;Locomotion Level;Transfers    Examination-Participation Restrictions Driving;Yard Work;Occupation    Stability/Clinical Decision Making Stable/Uncomplicated    Clinical Decision Making Low    Rehab Potential Good    PT Frequency 2x / week    PT Duration 6 weeks    PT Treatment/Interventions ADLs/Self Care Home Management;Aquatic Therapy;Cryotherapy;Electrical Stimulation;Traction;Moist Heat;Iontophoresis 25m/ml Dexamethasone;Gait training;Stair training;Functional mobility training;Therapeutic activities;Therapeutic exercise;Balance training;Neuromuscular re-education;Manual techniques;Patient/family education;Passive range of motion;Dry needling;Energy conservation;Joint Manipulations;Spinal Manipulations;Taping    PT Next Visit  Plan Continue lumbar mobility and core/proximal strengthening.  If no change trial LLE femoral nerve glide, if no better try LAD for LT hip pain    PT Home Exercise Plan repeated flexion; 10/04/20: SKTC/LTR 1/27 iso hip abduction/ adduction, bridge  Patient will benefit from skilled therapeutic intervention in order to improve the following deficits and impairments:  Abnormal gait,Decreased mobility,Decreased strength,Hypomobility,Decreased range of motion,Difficulty walking,Pain  Visit Diagnosis: Left-sided low back pain with left-sided sciatica, unspecified chronicity  Muscle weakness (generalized)  Other abnormalities of gait and mobility     Problem List Patient Active Problem List   Diagnosis Date Noted  . Malignant neoplasm of prostate ([place]) 09/26/2020  . Wellness examination 05/29/2020  . Elevated PSA 05/29/2020  . Pain in left knee 02/16/2020  . Acquired trigger finger of left little finger 05/14/2019  . Degeneration of lumbar intervertebral disc 12/05/2017  . Acquired trigger finger of right middle finger 10/10/2017  . Acquired trigger finger of right ring finger 10/10/2017  . Polyp of hepatic flexure of colon   . Prostate hypertrophy 12/11/2012  . Plantar fasciitis 12/11/2012  . Chronic back pain 12/11/2012   Ihor Austin, LPTA/CLT; CBIS [phone]  Aldona Lento 10/17/2020, [place] [address] [place], [place], [address] Phone: [phone]   Fax:  [phone]  Name: Markanthony Gedney MRN: [id number] Date of Birth: 1956/02/18

## 2020-10-19 ENCOUNTER — Ambulatory Visit (HOSPITAL_COMMUNITY): Payer: PRIVATE HEALTH INSURANCE | Admitting: Physical Therapy

## 2020-10-19 ENCOUNTER — Other Ambulatory Visit: Payer: Self-pay

## 2020-10-19 ENCOUNTER — Encounter (HOSPITAL_COMMUNITY): Payer: Self-pay | Admitting: Physical Therapy

## 2020-10-19 DIAGNOSIS — M5442 Lumbago with sciatica, left side: Secondary | ICD-10-CM | POA: Diagnosis not present

## 2020-10-19 DIAGNOSIS — M6281 Muscle weakness (generalized): Secondary | ICD-10-CM

## 2020-10-19 DIAGNOSIS — R2689 Other abnormalities of gait and mobility: Secondary | ICD-10-CM

## 2020-10-19 NOTE — Patient Instructions (Signed)
Access Code: [email or web address] URL: [email or web address] Date: 10/19/2020 Prepared by: Josue Hector  Exercises Hooklying Clamshell with Resistance - 1 x daily - 7 x weekly - 2 sets - 10 reps - 5 sec hold Standing Heel Raise with Support - 1 x daily - 7 x weekly - 2 sets - 10 reps Sit to Stand Without Arm Support - 1 x daily - 7 x weekly - 2 sets - 10 reps Side Stepping with Resistance at Ankles and Counter Support - 1 x daily - 7 x weekly - 1 sets - 10 reps

## 2020-10-19 NOTE — Therapy (Signed)
[place], [place], [address] Phone: [phone]   Fax:  [phone]  Physical Therapy Treatment  Patient Details  Name: Ricardo Pacheco MRN: [id number] Date of Birth: 11/22/1955 Referring Provider (PT): Melina Schools, MD   Encounter Date: 10/19/2020   PT End of Session - 10/19/20 1733    Visit Number 5    Number of Visits 12    Date for PT Re-Evaluation 11/10/20    Authorization Type [employer], visit limit 43    Authorization - Visit Number 5    Authorization - Number of Visits 60    Progress Note Due on Visit 10    PT Start Time 1726    PT Stop Time 1812    PT Time Calculation (min) 46 min    Activity Tolerance Patient tolerated treatment well    Behavior During Therapy [facility] for tasks assessed/performed           Past Medical History:  Diagnosis Date  . Arthritis   . Decreased white blood cell count   . Hypercholesteremia   . Prostate cancer ([place])   . Prostate cancer [facility])     Past Surgical History:  Procedure Laterality Date  . COLONOSCOPY    . COLONOSCOPY N/A 07/18/2017   Procedure: COLONOSCOPY;  Surgeon: Danie Binder, MD;  [facility];  Service: Endoscopy;  Laterality: N/A;  2:15 pm  . KNEE ARTHROPLASTY    . POLYPECTOMY  07/18/2017   Procedure: POLYPECTOMY;  Surgeon: Danie Binder, MD;  Location: [facility];  Service: Endoscopy;;  hepatic flexure  . PROSTATE BIOPSY      There were no vitals filed for this visit.   Subjective Assessment - 10/19/20 1731    Subjective Patient says his hip feels similar. His back was hurting a little today but not too bad. It felt good Monday-Tuesday. He has been doing his exercises and these are helping.    Pertinent History prostate cancer, arthritis    Patient Stated Goals decrease pain with work (ambulation)    Currently in Pain? Yes    Pain Score 4     Pain Location Hip    Pain Orientation Left;Posterior    Pain Descriptors / Indicators  Aching    Pain Type Chronic pain    Pain Onset 1 to 4 weeks ago                             [facility] Adult PT Treatment/Exercise - 10/19/20 0001      Lumbar Exercises: Standing   Heel Raises 20 reps    Other Standing Lumbar Exercises band sidestepping GTB x5RT at mat      Lumbar Exercises: Seated   Sit to Stand 10 reps    Sit to Stand Limitations cues for eccentric lowering      Lumbar Exercises: Supine   Ab Set 10 reps;5 seconds    Clam 20 reps    Clam Limitations GTB 5" hold    Bridge 20 reps;5 seconds    Straight Leg Raise 10 reps    Other Supine Lumbar Exercises isometric hip adduction 20 x 5"                  PT Education - 10/19/20 1821    Education Details on car transfers for decreased hip strain    Person(s) Educated Patient    Methods Explanation    Comprehension  Verbalized understanding            PT Short Term Goals - 09/29/20 1706      PT SHORT TERM GOAL #1   Title Patient will report at least 25% improvement in symptoms for improved quality of life.    Time 3    Period Weeks    Status New    Target Date 10/20/20      PT SHORT TERM GOAL #2   Title Patient will be independent in self management strategies to improve quality of life and functional outcomes.    Time 3    Period Weeks    Status New      PT SHORT TERM GOAL #3   Title Patient will ambulate 5 minutes before radicular pain begins allowing for improved functional mobility.    Baseline 10 seconds before pain    Time 3    Period Weeks    Status New             PT Long Term Goals - 09/29/20 1707      PT LONG TERM GOAL #1   Title Patient will report at least 50% improvement in symptoms for improved quality of life.    Time 6    Period Weeks    Status New    Target Date 11/10/20      PT LONG TERM GOAL #2   Title Patient will improve FOTO score in projected manner to demonstrate improvement in function and mobility.    Time 6    Period Weeks    Status  New      PT LONG TERM GOAL #3   Title Patient will ambulate for 10 minutes before radicular pain begins to allow for improved work function and improvement in functional mobility.    Time 6    Period Weeks    Status New                 Plan - 10/19/20 1818    Clinical Impression Statement Patient tolerated session well today with no increased complaint of pain. Progressed to standing LE strengthening and squatting from chair. Patient educated on purpose and function of all added exercise. Patient cued on eccentric lowering for control during sit to stands. Patient reports overall decreased posterior hip pain post treatment. Issued updated HEP handout. Patient will continue to benefit from hip and core strength progressions for decreased pain and improved LOF with ADLs.    Personal Factors and Comorbidities Comorbidity 2    Comorbidities arthritis, history of DDD    Examination-Activity Limitations Bend;Carry;Stand;Stairs;Locomotion Level;Transfers    Examination-Participation Restrictions Driving;Yard Work;Occupation    Stability/Clinical Decision Making Stable/Uncomplicated    Rehab Potential Good    PT Frequency 2x / week    PT Duration 6 weeks    PT Treatment/Interventions ADLs/Self Care Home Management;Aquatic Therapy;Cryotherapy;Electrical Stimulation;Traction;Moist Heat;Iontophoresis 4mg /ml Dexamethasone;Gait training;Stair training;Functional mobility training;Therapeutic activities;Therapeutic exercise;Balance training;Neuromuscular re-education;Manual techniques;Patient/family education;Passive range of motion;Dry needling;Energy conservation;Joint Manipulations;Spinal Manipulations;Taping    PT Next Visit Plan Continue lumbar mobility and core/proximal strengthening.  Add palloff press and single limb stance next visit    PT Home Exercise Plan repeated flexion; 10/04/20: SKTC/LTR 1/27 iso hip abduction/ adduction, bridge 2/3 heel raise, sit/stand, band sidestepping    Consulted  and Agree with Plan of Care Patient           Patient will benefit from skilled therapeutic intervention in order to improve the following deficits and impairments:  Abnormal gait,Decreased mobility,Decreased strength,Hypomobility,Decreased  range of motion,Difficulty walking,Pain  Visit Diagnosis: Left-sided low back pain with left-sided sciatica, unspecified chronicity  Muscle weakness (generalized)  Other abnormalities of gait and mobility     Problem List Patient Active Problem List   Diagnosis Date Noted  . Malignant neoplasm of prostate ([place]) 09/26/2020  . Wellness examination 05/29/2020  . Elevated PSA 05/29/2020  . Pain in left knee 02/16/2020  . Acquired trigger finger of left little finger 05/14/2019  . Degeneration of lumbar intervertebral disc 12/05/2017  . Acquired trigger finger of right middle finger 10/10/2017  . Acquired trigger finger of right ring finger 10/10/2017  . Polyp of hepatic flexure of colon   . Prostate hypertrophy 12/11/2012  . Plantar fasciitis 12/11/2012  . Chronic back pain 12/11/2012   6:24 PM, 10/19/20 Josue Hector PT DPT  Physical Therapist with [facility]  [phone]   [facility] [address] [place], [place], [address] Phone: [phone]   Fax:  [phone]  Name: Ricardo Pacheco MRN: [id number] Date of Birth: 09/28/1955

## 2020-10-24 ENCOUNTER — Other Ambulatory Visit: Payer: Self-pay

## 2020-10-24 ENCOUNTER — Encounter (HOSPITAL_COMMUNITY): Payer: Self-pay | Admitting: Physical Therapy

## 2020-10-24 ENCOUNTER — Ambulatory Visit (HOSPITAL_COMMUNITY): Payer: PRIVATE HEALTH INSURANCE | Admitting: Physical Therapy

## 2020-10-24 DIAGNOSIS — M5442 Lumbago with sciatica, left side: Secondary | ICD-10-CM | POA: Diagnosis not present

## 2020-10-24 DIAGNOSIS — M6281 Muscle weakness (generalized): Secondary | ICD-10-CM

## 2020-10-24 DIAGNOSIS — R2689 Other abnormalities of gait and mobility: Secondary | ICD-10-CM

## 2020-10-25 ENCOUNTER — Telehealth: Payer: Self-pay | Admitting: *Deleted

## 2020-10-25 NOTE — Progress Notes (Signed)
   10/24/20 0001  Lumbar Exercises: Stretches  Prone on Elbows Stretch 3 reps;60 seconds  Press Ups 1 rep;10 reps  Lumbar Exercises: Standing  Heel Raises 20 reps  Other Standing Lumbar Exercises SLS 3 x 20" each on solid floor  Row 20 reps;Theraband  Theraband Level (Row) Level 3 (Green)  Row Limitations cues for ab brace  Shoulder Extension 20 reps;Theraband  Theraband Level (Shoulder Extension) Level 3 (Green)  Shoulder Extension Limitations cues for ab brace  Other Standing Lumbar Exercises palloff press GTB x 20 each  Lumbar Exercises: Seated  Sit to Stand 10 reps  Lumbar Exercises: Supine  Bridge 10 reps;5 seconds  Other Supine Lumbar Exercises isometric hip abduction/ adduction 10 x 5"

## 2020-10-25 NOTE — Therapy (Signed)
[place], [place], [address] Phone: [phone]   Fax:  [phone]  Physical Therapy Treatment  Patient Details  Name: Ricardo Pacheco MRN: [id number] Date of Birth: 12-Jun-1956 Referring Provider (PT): Melina Schools, MD   Encounter Date: 10/24/2020   PT End of Session - 10/24/20 1728    Visit Number 6    Number of Visits 12    Date for PT Re-Evaluation 11/10/20    Authorization Type [employer], visit limit 62    Authorization - Visit Number 6    Authorization - Number of Visits 60    Progress Note Due on Visit 10    PT Start Time 4259    PT Stop Time 1805    PT Time Calculation (min) 40 min    Activity Tolerance Patient tolerated treatment well    Behavior During Therapy [facility] for tasks assessed/performed           Past Medical History:  Diagnosis Date  . Arthritis   . Decreased white blood cell count   . Hypercholesteremia   . Prostate cancer ([place])   . Prostate cancer [facility])     Past Surgical History:  Procedure Laterality Date  . COLONOSCOPY    . COLONOSCOPY N/A 07/18/2017   Procedure: COLONOSCOPY;  Surgeon: Danie Binder, MD;  [facility];  Service: Endoscopy;  Laterality: N/A;  2:15 pm  . KNEE ARTHROPLASTY    . POLYPECTOMY  07/18/2017   Procedure: POLYPECTOMY;  Surgeon: Danie Binder, MD;  [facility];  Service: Endoscopy;;  hepatic flexure  . PROSTATE BIOPSY      There were no vitals filed for this visit.   Subjective Assessment - 10/24/20 1728    Subjective Patient says his groin pain has not bothered him all day, but his back has started acting up toward the end of the day at work today. Its about a 5 right now.    Pertinent History prostate cancer, arthritis    Patient Stated Goals decrease pain with work (ambulation)    Currently in Pain? Yes    Pain Score 5     Pain Location Back    Pain Orientation Posterior    Pain Descriptors / Indicators Aching    Pain  Onset 1 to 4 weeks ago    Pain Frequency Intermittent                    10/24/20 0001  Lumbar Exercises: Stretches  Prone on Elbows Stretch 3 reps;60 seconds  Press Ups 1 rep;10 reps  Lumbar Exercises: Standing  Heel Raises 20 reps  Other Standing Lumbar Exercises SLS 3 x 20" each on solid floor  Row 20 reps;Theraband  Theraband Level (Row) Level 3 (Green)  Row Limitations cues for ab brace  Shoulder Extension 20 reps;Theraband  Theraband Level (Shoulder Extension) Level 3 (Green)  Shoulder Extension Limitations cues for ab brace  Other Standing Lumbar Exercises palloff press GTB x 20 each  Lumbar Exercises: Seated  Sit to Stand 10 reps  Lumbar Exercises: Supine  Bridge 10 reps;5 seconds  Other Supine Lumbar Exercises isometric hip abduction/ adduction 10 x 5"     PT Short Term Goals - 09/29/20 1706      PT SHORT TERM GOAL #1   Title Patient will report at least 25% improvement in symptoms for improved quality of life.    Time 3    Period Weeks  Status New    Target Date 10/20/20      PT SHORT TERM GOAL #2   Title Patient will be independent in self management strategies to improve quality of life and functional outcomes.    Time 3    Period Weeks    Status New      PT SHORT TERM GOAL #3   Title Patient will ambulate 5 minutes before radicular pain begins allowing for improved functional mobility.    Baseline 10 seconds before pain    Time 3    Period Weeks    Status New             PT Long Term Goals - 09/29/20 1707      PT LONG TERM GOAL #1   Title Patient will report at least 50% improvement in symptoms for improved quality of life.    Time 6    Period Weeks    Status New    Target Date 11/10/20      PT LONG TERM GOAL #2   Title Patient will improve FOTO score in projected manner to demonstrate improvement in function and mobility.    Time 6    Period Weeks    Status New      PT LONG TERM GOAL #3   Title Patient will ambulate for 10  minutes before radicular pain begins to allow for improved work function and improvement in functional mobility.    Time 6    Period Weeks    Status New                 Plan - 10/25/20 1806    Clinical Impression Statement Patient tolerated session well today. Started with prone stretching into lumbar extension for complaint of increased back pain upon arrival. This helped reduce back pain almost entirely. Then worked on hip strength progressions. Patient educated on purpose and function of all added activity. Added band rows and extensions with cues for abdominal activation for core strengthening. Patient cued on hand positioning with rows. Patient did note slight discomfort in groin area during single leg stands but resolved with rest. Patient reports little to no pain at end of session. Patient will continue to benefit from core and hip strength progressions to reduce pain and improve functional mobility.    Personal Factors and Comorbidities Comorbidity 2    Comorbidities arthritis, history of DDD    Examination-Activity Limitations Bend;Carry;Stand;Stairs;Locomotion Level;Transfers    Examination-Participation Restrictions Driving;Yard Work;Occupation    Stability/Clinical Decision Making Stable/Uncomplicated    Rehab Potential Good    PT Frequency 2x / week    PT Duration 6 weeks    PT Treatment/Interventions ADLs/Self Care Home Management;Aquatic Therapy;Cryotherapy;Electrical Stimulation;Traction;Moist Heat;Iontophoresis 4mg /ml Dexamethasone;Gait training;Stair training;Functional mobility training;Therapeutic activities;Therapeutic exercise;Balance training;Neuromuscular re-education;Manual techniques;Patient/family education;Passive range of motion;Dry needling;Energy conservation;Joint Manipulations;Spinal Manipulations;Taping    PT Next Visit Plan Continue lumbar mobility and core/proximal strengthening. and monster walks    PT Home Exercise Plan repeated flexion; 10/04/20:  SKTC/LTR 1/27 iso hip abduction/ adduction, bridge 2/3 heel raise, sit/stand, band sidestepping 2/8 band rows and extensions with cue for ab brace    Consulted and Agree with Plan of Care Patient           Patient will benefit from skilled therapeutic intervention in order to improve the following deficits and impairments:  Abnormal gait,Decreased mobility,Decreased strength,Hypomobility,Decreased range of motion,Difficulty walking,Pain  Visit Diagnosis: Left-sided low back pain with left-sided sciatica, unspecified chronicity  Muscle weakness (generalized)  Other abnormalities  of gait and mobility     Problem List Patient Active Problem List   Diagnosis Date Noted  . Malignant neoplasm of prostate ([place]) 09/26/2020  . Wellness examination 05/29/2020  . Elevated PSA 05/29/2020  . Pain in left knee 02/16/2020  . Acquired trigger finger of left little finger 05/14/2019  . Degeneration of lumbar intervertebral disc 12/05/2017  . Acquired trigger finger of right middle finger 10/10/2017  . Acquired trigger finger of right ring finger 10/10/2017  . Polyp of hepatic flexure of colon   . Prostate hypertrophy 12/11/2012  . Plantar fasciitis 12/11/2012  . Chronic back pain 12/11/2012   6:13 PM, 10/25/20 Josue Hector PT DPT  Physical Therapist with [facility]  [phone]   [facility] [address] [place], [place], [address] Phone: [phone]   Fax:  [phone]  Name: Ricardo Pacheco MRN: [id number] Date of Birth: Jul 10, 1956

## 2020-10-25 NOTE — Telephone Encounter (Signed)
CALLED PATIENT TO REMIND OF PRE-SEED APPTS. FOR 10-26-20, LVM FOR A RETURN CALL

## 2020-10-26 ENCOUNTER — Ambulatory Visit (HOSPITAL_COMMUNITY)
Admission: RE | Admit: 2020-10-26 | Discharge: 2020-10-26 | Disposition: A | Discharge status: Home / Self Care | Payer: PRIVATE HEALTH INSURANCE | Source: Ambulatory Visit | Attending: Urology | Admitting: Urology

## 2020-10-26 ENCOUNTER — Other Ambulatory Visit: Payer: Self-pay

## 2020-10-26 ENCOUNTER — Ambulatory Visit
Admission: RE | Admit: 2020-10-26 | Discharge: 2020-10-26 | Disposition: A | Discharge status: Home / Self Care | Payer: PRIVATE HEALTH INSURANCE | Source: Ambulatory Visit | Attending: Radiation Oncology | Admitting: Radiation Oncology

## 2020-10-26 ENCOUNTER — Ambulatory Visit (HOSPITAL_COMMUNITY): Payer: PRIVATE HEALTH INSURANCE | Admitting: Physical Therapy

## 2020-10-26 ENCOUNTER — Ambulatory Visit
Admission: RE | Admit: 2020-10-26 | Discharge: 2020-10-26 | Disposition: A | Discharge status: Home / Self Care | Payer: PRIVATE HEALTH INSURANCE | Source: Ambulatory Visit | Attending: Urology | Admitting: Urology

## 2020-10-26 ENCOUNTER — Encounter (HOSPITAL_COMMUNITY)
Admission: RE | Admit: 2020-10-26 | Discharge: 2020-10-26 | Disposition: A | Discharge status: Home / Self Care | Payer: PRIVATE HEALTH INSURANCE | Source: Ambulatory Visit | Attending: Urology | Admitting: Urology

## 2020-10-26 DIAGNOSIS — C61 Malignant neoplasm of prostate: Secondary | ICD-10-CM | POA: Insufficient documentation

## 2020-10-26 DIAGNOSIS — Z01810 Encounter for preprocedural cardiovascular examination: Secondary | ICD-10-CM | POA: Insufficient documentation

## 2020-10-26 NOTE — Progress Notes (Signed)
  Radiation Oncology         [phone]) [phone] ________________________________  Name: Ricardo Pacheco MRN: [id number]  Date: 10/26/2020  DOB: Jun 17, 1956  SIMULATION AND TREATMENT PLANNING NOTE PUBIC ARCH STUDY  DX:IPJASN, [name], DO  Raynelle Bring, MD  DIAGNOSIS: 65 y.o. gentleman with Stage T1c adenocarcinoma of the prostate with Gleason score of 3+4, and PSA of 4.  Oncology History  Malignant neoplasm of prostate ([place])  06/30/2020 Cancer Staging   Staging form: Prostate, AJCC 8th Edition - Clinical stage from 06/30/2020: Stage IIB (cT1c, cN0, cM0, PSA: 4, Grade Group: 2) - Signed by Freeman Caldron, PA-C on 09/26/2020   09/26/2020 Initial Diagnosis   Malignant neoplasm of prostate ([place])       ICD-10-CM   1. Malignant neoplasm of prostate ([place])  C61     COMPLEX SIMULATION:  The patient presented today for evaluation for possible prostate seed implant. He was brought to the radiation planning suite and placed supine on the CT couch. A 3-dimensional image study set was obtained in upload to the planning computer. There, on each axial slice, I contoured the prostate gland. Then, using three-dimensional radiation planning tools I reconstructed the prostate in view of the structures from the transperineal needle pathway to assess for possible pubic arch interference. In doing so, I did not appreciate any pubic arch interference. Also, the patient's prostate volume was estimated based on the drawn structure. The volume was 31 cc.  Given the pubic arch appearance and prostate volume, patient remains a good candidate to proceed with prostate seed implant. Today, he freely provided informed written consent to proceed.    PLAN: The patient will undergo prostate seed implant.   ________________________________  Sheral Apley. Tammi Klippel, M.D.

## 2020-10-31 ENCOUNTER — Other Ambulatory Visit: Payer: Self-pay

## 2020-10-31 ENCOUNTER — Encounter (HOSPITAL_COMMUNITY): Payer: Self-pay

## 2020-10-31 ENCOUNTER — Ambulatory Visit (HOSPITAL_COMMUNITY): Payer: PRIVATE HEALTH INSURANCE

## 2020-10-31 DIAGNOSIS — R2689 Other abnormalities of gait and mobility: Secondary | ICD-10-CM

## 2020-10-31 DIAGNOSIS — M6281 Muscle weakness (generalized): Secondary | ICD-10-CM

## 2020-10-31 DIAGNOSIS — M5442 Lumbago with sciatica, left side: Secondary | ICD-10-CM | POA: Diagnosis not present

## 2020-10-31 NOTE — Therapy (Signed)
Ricardo Pacheco, [place], [address] Phone: [phone]   Fax:  [phone]  Physical Therapy Treatment  Patient Details  Name: Ricardo Pacheco MRN: [id number] Date of Birth: 1955-12-16 Referring Provider (PT): Melina Schools, MD   Encounter Date: 10/31/2020   PT End of Session - 10/31/20 1733    Visit Number 7    Number of Visits 12    Date for PT Re-Evaluation 11/10/20    Authorization Type [employer], visit limit 55    Authorization - Visit Number 7    Authorization - Number of Visits 60    Progress Note Due on Visit 10    PT Start Time 9021    PT Stop Time 1808    PT Time Calculation (min) 41 min    Activity Tolerance Patient tolerated treatment well    Behavior During Therapy [facility] for tasks assessed/performed           Past Medical History:  Diagnosis Date  . Arthritis   . Decreased white blood cell count   . Hypercholesteremia   . Prostate cancer ([place])   . Prostate cancer [facility])     Past Surgical History:  Procedure Laterality Date  . COLONOSCOPY    . COLONOSCOPY N/A 07/18/2017   Procedure: COLONOSCOPY;  Surgeon: Danie Binder, MD;  [facility];  Service: Endoscopy;  Laterality: N/A;  2:15 pm  . KNEE ARTHROPLASTY    . POLYPECTOMY  07/18/2017   Procedure: POLYPECTOMY;  Surgeon: Danie Binder, MD;  Location[facility];  Service: Endoscopy;;  hepatic flexure  . PROSTATE BIOPSY      There were no vitals filed for this visit.   Subjective Assessment - 10/31/20 1730    Subjective Pt stated he is feeling good today, no reports of groin pain today.  Back pain is 2/10    Pertinent History prostate cancer, arthritis    Patient Stated Goals decrease pain with work (ambulation)    Currently in Pain? Yes    Pain Score 2     Pain Location Back    Pain Orientation Lower;Left    Pain Descriptors / Indicators Dull;Aching    Pain Type Chronic pain    Pain Onset 1 to 4 weeks ago    Pain Frequency  Intermittent    Aggravating Factors  standing, walking    Pain Relieving Factors sitting, resting                             [facility] Adult PT Treatment/Exercise - 10/31/20 0001      Exercises   Exercises Lumbar      Lumbar Exercises: Stretches   Prone on Elbows Stretch Limitations 2 min    Other Lumbar Stretch Exercise 3D hip excursion (sidebend wiht UE overhead, chair behind for squats, lumber extension, rotation)      Lumbar Exercises: Standing   Heel Raises 20 reps    Heel Raises Limitations incline slope 3" holds    Row 20 reps;Theraband    Theraband Level (Row) Level 3 (Green)    Shoulder Extension 20 reps;Theraband    Theraband Level (Shoulder Extension) Level 3 (Green)    Shoulder Extension Limitations cue for ab brace    Other Standing Lumbar Exercises vector stance 3x 5"    Other Standing Lumbar Exercises palloff press GTB partial tandem stance 4x 15; sidestep in minisquat GTB around ankle; monster walk 2RT GTB  around thigh      Lumbar Exercises: Prone   Single Arm Raise 10 reps    Straight Leg Raise 10 reps                    PT Short Term Goals - 09/29/20 1706      PT SHORT TERM GOAL #1   Title Patient will report at least 25% improvement in symptoms for improved quality of life.    Time 3    Period Weeks    Status New    Target Date 10/20/20      PT SHORT TERM GOAL #2   Title Patient will be independent in self management strategies to improve quality of life and functional outcomes.    Time 3    Period Weeks    Status New      PT SHORT TERM GOAL #3   Title Patient will ambulate 5 minutes before radicular pain begins allowing for improved functional mobility.    Baseline 10 seconds before pain    Time 3    Period Weeks    Status New             PT Long Term Goals - 09/29/20 1707      PT LONG TERM GOAL #1   Title Patient will report at least 50% improvement in symptoms for improved quality of life.    Time 6     Period Weeks    Status New    Target Date 11/10/20      PT LONG TERM GOAL #2   Title Patient will improve FOTO score in projected manner to demonstrate improvement in function and mobility.    Time 6    Period Weeks    Status New      PT LONG TERM GOAL #3   Title Patient will ambulate for 10 minutes before radicular pain begins to allow for improved work function and improvement in functional mobility.    Time 6    Period Weeks    Status New                 Plan - 10/31/20 1802    Clinical Impression Statement Pt progressing well towards POC, stated he feels at least 50% improvements.  Session focus iwht lumbar mobility, postural strengthening and gluteal strengthening.  Reviewed mechanics with squats to reduce stress anterior knee for pain control.  Added monster walk and prone UE/LE for strengthening.  Pt tolerated well with no reports of pain through session.  Min cueing for core stability with paloff exercises, progress to partail tandem stance wiht activity for balance training.    Personal Factors and Comorbidities Comorbidity 2    Comorbidities arthritis, history of DDD    Examination-Activity Limitations Bend;Carry;Stand;Stairs;Locomotion Level;Transfers    Examination-Participation Restrictions Driving;Yard Work;Occupation    Stability/Clinical Decision Making Stable/Uncomplicated    Clinical Decision Making Low    Rehab Potential Good    PT Frequency 2x / week    PT Duration 6 weeks    PT Treatment/Interventions ADLs/Self Care Home Management;Aquatic Therapy;Cryotherapy;Electrical Stimulation;Traction;Moist Heat;Iontophoresis 4mg /ml Dexamethasone;Gait training;Stair training;Functional mobility training;Therapeutic activities;Therapeutic exercise;Balance training;Neuromuscular re-education;Manual techniques;Patient/family education;Passive range of motion;Dry needling;Energy conservation;Joint Manipulations;Spinal Manipulations;Taping    PT Next Visit Plan Continue  lumbar mobility and core/proximal strengthening.  Trial prone UE/LE when able    PT Home Exercise Plan repeated flexion; 10/04/20: SKTC/LTR 1/27 iso hip abduction/ adduction, bridge 2/3 heel raise, sit/stand, band sidestepping 2/8 band rows and extensions with  cue for ab brace    Consulted and Agree with Plan of Care Patient           Patient will benefit from skilled therapeutic intervention in order to improve the following deficits and impairments:  Abnormal gait,Decreased mobility,Decreased strength,Hypomobility,Decreased range of motion,Difficulty walking,Pain  Visit Diagnosis: Left-sided low back pain with left-sided sciatica, unspecified chronicity  Muscle weakness (generalized)  Other abnormalities of gait and mobility     Problem List Patient Active Problem List   Diagnosis Date Noted  . Malignant neoplasm of prostate ([place]) 09/26/2020  . Wellness examination 05/29/2020  . Elevated PSA 05/29/2020  . Pain in left knee 02/16/2020  . Acquired trigger finger of left little finger 05/14/2019  . Degeneration of lumbar intervertebral disc 12/05/2017  . Acquired trigger finger of right middle finger 10/10/2017  . Acquired trigger finger of right ring finger 10/10/2017  . Polyp of hepatic flexure of colon   . Prostate hypertrophy 12/11/2012  . Plantar fasciitis 12/11/2012  . Chronic back pain 12/11/2012   Ihor Austin, LPTA/CLT; CBIS [phone]  Aldona Lento 10/31/2020, 6:10 PM  [place] [address] [place], [place], [address] Phone: [phone]   Fax:  [phone]  Name: Carzell Saldivar MRN: [id number] Date of Birth: 1956/09/06

## 2020-11-02 ENCOUNTER — Ambulatory Visit (HOSPITAL_COMMUNITY): Payer: PRIVATE HEALTH INSURANCE | Admitting: Physical Therapy

## 2020-11-02 ENCOUNTER — Other Ambulatory Visit: Payer: Self-pay

## 2020-11-02 ENCOUNTER — Encounter (HOSPITAL_COMMUNITY): Payer: Self-pay | Admitting: Physical Therapy

## 2020-11-02 DIAGNOSIS — M5442 Lumbago with sciatica, left side: Secondary | ICD-10-CM

## 2020-11-02 DIAGNOSIS — M6281 Muscle weakness (generalized): Secondary | ICD-10-CM

## 2020-11-02 DIAGNOSIS — R2689 Other abnormalities of gait and mobility: Secondary | ICD-10-CM

## 2020-11-02 NOTE — Therapy (Signed)
[place], [place], [address] Phone: [phone]   Fax:  [phone]  Physical Therapy Treatment  Patient Details  Name: Ricardo Pacheco MRN: [id number] Date of Birth: 1956-02-19 Referring Provider (PT): Melina Schools, MD   Encounter Date: 11/02/2020   PT End of Session - 11/02/20 1735    Visit Number 8    Number of Visits 12    Date for PT Re-Evaluation 11/10/20    Authorization Type [employer], visit limit 74    Authorization - Visit Number 8    Authorization - Number of Visits 60    Progress Note Due on Visit 10    PT Start Time 1829    PT Stop Time 1810    PT Time Calculation (min) 40 min    Activity Tolerance Patient tolerated treatment well    Behavior During Therapy [facility] for tasks assessed/performed           Past Medical History:  Diagnosis Date  . Arthritis   . Decreased white blood cell count   . Hypercholesteremia   . Prostate cancer ([place])   . Prostate cancer [facility])     Past Surgical History:  Procedure Laterality Date  . COLONOSCOPY    . COLONOSCOPY N/A 07/18/2017   Procedure: COLONOSCOPY;  Surgeon: Danie Binder, MD;  [facility];  Service: Endoscopy;  Laterality: N/A;  2:15 pm  . KNEE ARTHROPLASTY    . POLYPECTOMY  07/18/2017   Procedure: POLYPECTOMY;  Surgeon: Danie Binder, MD;  [facility];  Service: Endoscopy;;  hepatic flexure  . PROSTATE BIOPSY      There were no vitals filed for this visit.   Subjective Assessment - 11/02/20 1733    Subjective Doing ok today. Pain has been minimal, about a 2. "I can do more than I could have done a couple weeks ago."    Pertinent History prostate cancer, arthritis    Patient Stated Goals decrease pain with work (ambulation)    Currently in Pain? Yes    Pain Score 2     Pain Location Back    Pain Orientation Posterior    Pain Descriptors / Indicators Aching    Pain Type Chronic pain    Pain Onset 1 to 4 weeks ago     Pain Frequency Intermittent    Multiple Pain Sites Yes    Pain Score 2    Pain Location Groin    Pain Orientation Left;Anterior    Pain Descriptors / Indicators Aching    Pain Type Chronic pain    Pain Onset More than a month ago    Pain Frequency Intermittent                             [facility] Adult PT Treatment/Exercise - 11/02/20 0001      Exercises   Exercises Knee/Hip      Lumbar Exercises: Aerobic   Recumbent Bike 4 min Lv 2 dynamic warmup      Lumbar Exercises: Machines for Strengthening   Cybex Knee Flexion 40# 2 x 10    Leg Press 40# DL 2 x 10    Other Lumbar Machine Exercise machine walkouts 40# x10      Lumbar Exercises: Standing   Heel Raises 20 reps    Heel Raises Limitations incline slope 3" holds    Functional Squats 20 reps    Other Standing Lumbar  Exercises palloff press GTB 2 x 10 each sidestep in minisquat GTB around ankle 3RT      Knee/Hip Exercises: Standing   Forward Step Up Both;1 set;10 reps;Hand Hold: 0;Step Height: 4"   with power ups 3" hold   SLS 3 x 20" on foam                    PT Short Term Goals - 09/29/20 1706      PT SHORT TERM GOAL #1   Title Patient will report at least 25% improvement in symptoms for improved quality of life.    Time 3    Period Weeks    Status New    Target Date 10/20/20      PT SHORT TERM GOAL #2   Title Patient will be independent in self management strategies to improve quality of life and functional outcomes.    Time 3    Period Weeks    Status New      PT SHORT TERM GOAL #3   Title Patient will ambulate 5 minutes before radicular pain begins allowing for improved functional mobility.    Baseline 10 seconds before pain    Time 3    Period Weeks    Status New             PT Long Term Goals - 09/29/20 1707      PT LONG TERM GOAL #1   Title Patient will report at least 50% improvement in symptoms for improved quality of life.    Time 6    Period Weeks    Status New     Target Date 11/10/20      PT LONG TERM GOAL #2   Title Patient will improve FOTO score in projected manner to demonstrate improvement in function and mobility.    Time 6    Period Weeks    Status New      PT LONG TERM GOAL #3   Title Patient will ambulate for 10 minutes before radicular pain begins to allow for improved work function and improvement in functional mobility.    Time 6    Period Weeks    Status New                 Plan - 11/02/20 1808    Clinical Impression Statement Patient tolerated ther ex progressions well with minimal complaint. Progressed LE strengthening with added machine leg pressing and hamstring curl machine. Patient notes increased muscle fatigue with added reps to band sidestepping. Patient slightly challenged with single leg standing on foam but shows good stability overall. No increased pain at end of session. Patient will continue to benefit from strength and stabilization progressions for reduced pain and improved functional mobility.    Personal Factors and Comorbidities Comorbidity 2    Comorbidities arthritis, history of DDD    Examination-Activity Limitations Bend;Carry;Stand;Stairs;Locomotion Level;Transfers    Examination-Participation Restrictions Driving;Yard Work;Occupation    Stability/Clinical Decision Making Stable/Uncomplicated    Rehab Potential Good    PT Frequency 2x / week    PT Duration 6 weeks    PT Treatment/Interventions ADLs/Self Care Home Management;Aquatic Therapy;Cryotherapy;Electrical Stimulation;Traction;Moist Heat;Iontophoresis 4mg /ml Dexamethasone;Gait training;Stair training;Functional mobility training;Therapeutic activities;Therapeutic exercise;Balance training;Neuromuscular re-education;Manual techniques;Patient/family education;Passive range of motion;Dry needling;Energy conservation;Joint Manipulations;Spinal Manipulations;Taping    PT Next Visit Plan Continue lumbar mobility and core/proximal strengthening. Add  med ball hold with functional squatting. Increase band resistance with sidestep and monster walk    PT Home Exercise Plan  repeated flexion; 10/04/20: SKTC/LTR 1/27 iso hip abduction/ adduction, bridge 2/3 heel raise, sit/stand, band sidestepping 2/8 band rows and extensions with cue for ab brace 2/17 single leg stand on foam    Consulted and Agree with Plan of Care Patient           Patient will benefit from skilled therapeutic intervention in order to improve the following deficits and impairments:  Abnormal gait,Decreased mobility,Decreased strength,Hypomobility,Decreased range of motion,Difficulty walking,Pain  Visit Diagnosis: Left-sided low back pain with left-sided sciatica, unspecified chronicity  Muscle weakness (generalized)  Other abnormalities of gait and mobility     Problem List Patient Active Problem List   Diagnosis Date Noted  . Malignant neoplasm of prostate ([place]) 09/26/2020  . Wellness examination 05/29/2020  . Elevated PSA 05/29/2020  . Pain in left knee 02/16/2020  . Acquired trigger finger of left little finger 05/14/2019  . Degeneration of lumbar intervertebral disc 12/05/2017  . Acquired trigger finger of right middle finger 10/10/2017  . Acquired trigger finger of right ring finger 10/10/2017  . Polyp of hepatic flexure of colon   . Prostate hypertrophy 12/11/2012  . Plantar fasciitis 12/11/2012  . Chronic back pain 12/11/2012   6:18 PM, 11/02/20 Ricardo Pacheco PT DPT  Physical Therapist with [facility]  [phone] [place] [address] [place], [place], [address] Phone: [phone]   Fax:  [phone]  Name: Ricardo Pacheco MRN: [id number] Date of Birth: November 17, 1955

## 2020-11-02 NOTE — Patient Instructions (Signed)
Access Code: [place] URL: [email or web address] Date: 11/02/2020 Prepared by: Josue Hector  Exercises Single Leg Balance on Pillow - 2 x daily - 7 x weekly - 1 sets - 3 reps - 20-30 seconds hold

## 2020-11-07 ENCOUNTER — Ambulatory Visit (HOSPITAL_COMMUNITY): Payer: PRIVATE HEALTH INSURANCE | Admitting: Physical Therapy

## 2020-11-07 ENCOUNTER — Other Ambulatory Visit: Payer: Self-pay

## 2020-11-07 ENCOUNTER — Encounter (HOSPITAL_COMMUNITY): Payer: Self-pay | Admitting: Physical Therapy

## 2020-11-07 DIAGNOSIS — M5442 Lumbago with sciatica, left side: Secondary | ICD-10-CM | POA: Diagnosis not present

## 2020-11-07 DIAGNOSIS — M6281 Muscle weakness (generalized): Secondary | ICD-10-CM

## 2020-11-07 DIAGNOSIS — R2689 Other abnormalities of gait and mobility: Secondary | ICD-10-CM

## 2020-11-08 NOTE — Progress Notes (Signed)
   11/07/20 0001  Lumbar Exercises: Stretches  Prone on Elbows Stretch 3 reps;60 seconds  Lumbar Exercises: Aerobic  Recumbent Bike 4 min Lv 2 dynamic warmup  Lumbar Exercises: Standing  Other Standing Lumbar Exercises palloff press BTB 2 x 10 each, band lifts BTB 2 x 10 each  Knee/Hip Exercises: Standing  Forward Step Up Both;1 set;10 reps;Hand Hold: 0;Step Height: 6"  Heel Raises Both;20 reps  Forward Step Up Limitations with power up  SLS with Vectors 3 x 5" on foam  Other Standing Knee Exercises band sidestepping BTB 3RT, monster walks BTB 3RT  Functional Squat 2 sets;10 reps  Functional Squat Limitations with yellow med ball hold

## 2020-11-08 NOTE — Therapy (Signed)
[place], [place], [address] Phone: [phone]   Fax:  [phone]  Physical Therapy Treatment  Patient Details  Name: Ricardo Pacheco MRN: [id number] Date of Birth: 1956-05-22 Referring Provider (PT): Melina Schools, MD   Encounter Date: 11/07/2020   PT End of Session - 11/07/20 1727    Visit Number 9    Number of Visits 12    Date for PT Re-Evaluation 11/10/20    Authorization Type [employer], visit limit 2    Authorization - Visit Number 9    Authorization - Number of Visits 60    Progress Note Due on Visit 10    PT Start Time 1726    PT Stop Time 1815    PT Time Calculation (min) 49 min    Activity Tolerance Patient tolerated treatment well    Behavior During Therapy [facility] for tasks assessed/performed           Past Medical History:  Diagnosis Date  . Arthritis   . Decreased white blood cell count   . Hypercholesteremia   . Prostate cancer ([place])   . Prostate cancer [facility])     Past Surgical History:  Procedure Laterality Date  . COLONOSCOPY    . COLONOSCOPY N/A 07/18/2017   Procedure: COLONOSCOPY;  Surgeon: Danie Binder, MD;  [facility];  Service: Endoscopy;  Laterality: N/A;  2:15 pm  . KNEE ARTHROPLASTY    . POLYPECTOMY  07/18/2017   Procedure: POLYPECTOMY;  Surgeon: Danie Binder, MD;  [facility];  Service: Endoscopy;;  hepatic flexure  . PROSTATE BIOPSY      There were no vitals filed for this visit.   Subjective Assessment - 11/08/20 1157    Subjective Patient states his back was hurting today. 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 His groin pain has not been bothering him as much.    Pertinent History prostate cancer, arthritis    Patient Stated Goals decrease pain with work (ambulation)    Currently in Pain? Yes    Pain Score 5     Pain Location Back    Pain  Orientation Posterior    Pain Descriptors / Indicators Aching    Pain Type Chronic pain    Pain Onset 1 to 4 weeks ago    Pain Frequency Intermittent    Pain Onset More than a month ago             11/07/20 0001  Lumbar Exercises: Stretches  Prone on Elbows Stretch 3 reps;60 seconds  Lumbar Exercises: Aerobic  Recumbent Bike 4 min Lv 2 dynamic warmup  Lumbar Exercises: Standing  Other Standing Lumbar Exercises palloff press BTB 2 x 10 each, band lifts BTB 2 x 10 each  Knee/Hip Exercises: Standing  Forward Step Up Both;1 set;10 reps;Hand Hold: 0;Step Height: 6"  Heel Raises Both;20 reps  Forward Step Up Limitations with power up  SLS with Vectors 3 x 5" on foam  Other Standing Knee Exercises band sidestepping BTB 3RT, monster walks BTB 3RT  Functional Squat 2 sets;10 reps  Functional Squat Limitations with yellow med ball hold     PT Short Term Goals - 09/29/20 1706      PT SHORT TERM GOAL #1   Title Patient will report at least 25% improvement in symptoms for improved quality of life.  Time 3    Period Weeks    Status New    Target Date 10/20/20      PT SHORT TERM GOAL #2   Title Patient will be independent in self management strategies to improve quality of life and functional outcomes.    Time 3    Period Weeks    Status New      PT SHORT TERM GOAL #3   Title Patient will ambulate 5 minutes before radicular pain begins allowing for improved functional mobility.    Baseline 10 seconds before pain    Time 3    Period Weeks    Status New             PT Long Term Goals - 09/29/20 1707      PT LONG TERM GOAL #1   Title Patient will report at least 50% improvement in symptoms for improved quality of life.    Time 6    Period Weeks    Status New    Target Date 11/10/20      PT LONG TERM GOAL #2   Title Patient will improve FOTO score in projected manner to demonstrate improvement in function and mobility.    Time 6    Period Weeks    Status New       PT LONG TERM GOAL #3   Title Patient will ambulate for 10 minutes before radicular pain begins to allow for improved work function and improvement in functional mobility.    Time 6    Period Weeks    Status New                 Plan - 11/08/20 1153    Clinical Impression Statement Patient progressing well. Patient entered clinic at 5/10 back pain reported. Pain improved with ther ex, only noting groin pain with single leg stands. Pain subsides with rest. Performed prone on elbows at end of session, back pain reduced to 1/10. Patient educated on purpose and function of all ther ex progressions today. Increased band sidesteps to blue resistance and added band monster walks for increased hip strength and stability. Patient will continue to benefit from strength progressions for reduced pain and improved functional mobility.    Personal Factors and Comorbidities Comorbidity 2    Comorbidities arthritis, history of DDD    Examination-Activity Limitations Bend;Carry;Stand;Stairs;Locomotion Level;Transfers    Examination-Participation Restrictions Driving;Yard Work;Occupation    Stability/Clinical Decision Making Stable/Uncomplicated    Rehab Potential Good    PT Frequency 2x / week    PT Duration 6 weeks    PT Treatment/Interventions ADLs/Self Care Home Management;Aquatic Therapy;Cryotherapy;Electrical Stimulation;Traction;Moist Heat;Iontophoresis 4mg /ml Dexamethasone;Gait training;Stair training;Functional mobility training;Therapeutic activities;Therapeutic exercise;Balance training;Neuromuscular re-education;Manual techniques;Patient/family education;Passive range of motion;Dry needling;Energy conservation;Joint Manipulations;Spinal Manipulations;Taping    PT Next Visit Plan Reassess next visit. Try machine deadlifts    PT Home Exercise Plan repeated flexion; 10/04/20: SKTC/LTR 1/27 iso hip abduction/ adduction, bridge 2/3 heel raise, sit/stand, band sidestepping 2/8 band rows and extensions  with cue for ab brace 2/17 single leg stand on foam 2/22 increased band resistance to blue    Consulted and Agree with Plan of Care Patient           Patient will benefit from skilled therapeutic intervention in order to improve the following deficits and impairments:  Abnormal gait,Decreased mobility,Decreased strength,Hypomobility,Decreased range of motion,Difficulty walking,Pain  Visit Diagnosis: Left-sided low back pain with left-sided sciatica, unspecified chronicity  Muscle weakness (generalized)  Other abnormalities of gait and  mobility     Problem List Patient Active Problem List   Diagnosis Date Noted  . Malignant neoplasm of prostate ([place]) 09/26/2020  . Wellness examination 05/29/2020  . Elevated PSA 05/29/2020  . Pain in left knee 02/16/2020  . Acquired trigger finger of left little finger 05/14/2019  . Degeneration of lumbar intervertebral disc 12/05/2017  . Acquired trigger finger of right middle finger 10/10/2017  . Acquired trigger finger of right ring finger 10/10/2017  . Polyp of hepatic flexure of colon   . Prostate hypertrophy 12/11/2012  . Plantar fasciitis 12/11/2012  . Chronic back pain 12/11/2012   12:00 PM, 11/08/20 Josue Hector PT DPT  Physical Therapist with [facility]  [phone] [place] [address] [place], [place], [address] Phone: [phone]   Fax:  [phone]  Name: Ricardo Pacheco MRN: [id number] Date of Birth: [birth year]

## 2020-11-08 NOTE — Progress Notes (Unsigned)
Subjective: 1. Prostate cancer [facility])      Mr. Ricardo Pacheco returns in f/u.  He is scheduled for a seed implant on 11/25/19.  He has.  He has T1c Nx Mx prostate cancer with 2 cores of Gleason 7(3+4) disease with >50% involvement with one on each side  and 8 cores of Gleason 6 disease with up to 80% involvement with 4 on each side.    He has well preserved erectile function.  He has BPH with BOO and is on tamsulosin with moderate LUTS.  His IPSS is 10 with nocturia x 3 and some frequency.  His UA is clear today.    [facility] nomogram predicts 43% OCD, 56% ECE, 8% LNI and 10% SVI.    Gu hx: Mr. Ricardo Pacheco is a 65 yo AAM who is sent by Elvia Collum DO for an elevated PSA of 4.6 on 05/24/20 which is up from 2.5 in 10/20 and 2.4 in 9/19.  He has moderate LUTS with some urgency and nocturia that is worse with fluids.  His IPSS is 12.  He has been on tamsulosin for several years.  He is on chronic hydrocodone for chronic back pain but he only takes it intermittently when the epidurals wear off.  He has no history of UTI's, stone or GU surgery.  He has two brother with prostate cancer in their upper 15's.    ROS:  ROS  No Known Allergies  Past Medical History:  Diagnosis Date  . Arthritis   . Decreased white blood cell count   . Hypercholesteremia   . Prostate cancer ([place])   . Prostate cancer [facility])     Past Surgical History:  Procedure Laterality Date  . COLONOSCOPY    . COLONOSCOPY N/A 07/18/2017   Procedure: COLONOSCOPY;  Surgeon: Danie Binder, MD;  [facility];  Service: Endoscopy;  Laterality: N/A;  2:15 pm  . KNEE ARTHROPLASTY    . POLYPECTOMY  07/18/2017   Procedure: POLYPECTOMY;  Surgeon: Danie Binder, MD;  Location: [facility];  Service: Endoscopy;;  hepatic flexure  . PROSTATE BIOPSY      Social History   Socioeconomic History  . Marital status: Married    Spouse name: Not on file  . Number of children: 2  . Years of education: Not on file  . Highest education  level: Not on file  Occupational History  . Occupation: [place]    Comment: full time; plans to retire again in May  Tobacco Use  . Smoking status: Former Smoker    Packs/day: 1.00    Years: 28.00    Pack years: 28.00    Types: Cigarettes    Quit date: 09/16/2000    Years since quitting: 20.1  . Smokeless tobacco: Never Used  Vaping Use  . Vaping Use: Never used  Substance and Sexual Activity  . Alcohol use: No  . Drug use: No  . Sexual activity: Yes  Other Topics Concern  . Not on file  Social History Narrative  . Not on file   Social Determinants of Health   Financial Resource Strain: Not on file  Food Insecurity: Not on file  Transportation Needs: Not on file  Physical Activity: Not on file  Stress: Not on file  Social Connections: Not on file  Intimate Partner Violence: Not on file    Family History  Problem Relation Age of Onset  . Hypertension Mother   . Stroke Father   . Prostate cancer Brother   .  Throat cancer Brother   . Prostate cancer Brother   . Colon cancer Neg Hx   . Colon polyps Neg Hx   . Breast cancer Neg Hx   . Pancreatic cancer Neg Hx     Anti-infectives: Anti-infectives (From admission, onward)   None      Current Outpatient Medications  Medication Sig Dispense Refill  . diclofenac (VOLTAREN) 75 MG EC tablet Take 75 mg by mouth 2 (two) times daily.    Marland Kitchen HYDROcodone-acetaminophen (NORCO) 10-325 MG tablet Take 1 tablet by mouth every 6 (six) hours as needed for moderate pain., 60 tablet 0  . methocarbamol (ROBAXIN) 500 MG tablet Take 500 mg by mouth 4 (four) times daily.    . methylPREDNISolone (MEDROL DOSEPAK) 4 MG TBPK tablet Take by mouth.    . Multiple Vitamin (MULTIVITAMIN WITH MINERALS) TABS tablet Take 1 tablet by mouth daily.    . tamsulosin (FLOMAX) 0.4 MG CAPS capsule TAKE 1 CAPSULE BY MOUTH DAILY 30 capsule 2   No current facility-administered medications for this visit.     Objective: Vital signs in last 24  hours: BP 134/83   Pulse 84   Temp 98.4 F (36.9 C)   Wt 213 lb (96.6 kg)   BMI 28.89 kg/m   Intake/Output from previous day: No intake/output data recorded. Intake/Output this shift: @IOTHISSHIFT @   Physical Exam Vitals reviewed.  Constitutional:      Appearance: Normal appearance.  Cardiovascular:     Rate and Rhythm: Normal rate and regular rhythm.     Heart sounds: Normal heart sounds.  Pulmonary:     Effort: Pulmonary effort is normal.     Breath sounds: Normal breath sounds.  Neurological:     Mental Status: He is alert.      Studies/Results: UA is clear.    Assessment/Plan: Prostate cancer:  T1c Nx Mx Gleason 7(3+4) prostate cancer.  He has elected a seed implant.    I reviewed the meds he needs to hold and answered his remaining questions.    Family history of prostate cancer.  He has 2 brothers with cancer.    BPH with BOO.  He is voiding ok on tamsulosin.   No orders of the defined types were placed in this encounter.    Orders Placed This Encounter  Procedures  . Urinalysis, Routine w reflex microscopic     Return in about 1 month (around 12/07/2020).    CC: Dr. Elvia Collum    Irine Seal 11/09/2020 [phone]

## 2020-11-08 NOTE — H&P (View-Only) (Signed)
Subjective: 1. Prostate cancer ([facility])      Ricardo Pacheco returns in f/u.  He is scheduled for a seed implant on 11/25/19.  He has.  He has T1c Nx Mx prostate cancer with 2 cores of Gleason 7(3+4) disease with >50% involvement with one on each side  and 8 cores of Gleason 6 disease with up to 80% involvement with 4 on each side.    He has well preserved erectile function.  He has BPH with BOO and is on tamsulosin with moderate LUTS.  His IPSS is 10 with nocturia x 3 and some frequency.  His UA is clear today.    [facility] nomogram predicts 43% OCD, 56% ECE, 8% LNI and 10% SVI.    Gu hx: Ricardo Pacheco is a 64 yo AAM who is sent by Malena Taylor DO for an elevated PSA of 4.6 on 05/24/20 which is up from 2.5 in 10/20 and 2.4 in 9/19.  He has moderate LUTS with some urgency and nocturia that is worse with fluids.  His IPSS is 12.  He has been on tamsulosin for several years.  He is on chronic hydrocodone for chronic back pain but he only takes it intermittently when the epidurals wear off.  He has no history of UTI's, stone or GU surgery.  He has two brother with prostate cancer in their upper 60's.    ROS:  ROS  No Known Allergies  Past Medical History:  Diagnosis Date  . Arthritis   . Decreased white blood cell count   . Hypercholesteremia   . Prostate cancer ([facility])   . Prostate cancer ([facility])     Past Surgical History:  Procedure Laterality Date  . COLONOSCOPY    . COLONOSCOPY N/A 07/18/2017   Procedure: COLONOSCOPY;  Surgeon: Fields, Sandi L, MD;  [facility];  Service: Endoscopy;  Laterality: N/A;  2:15 pm  . KNEE ARTHROPLASTY    . POLYPECTOMY  07/18/2017   Procedure: POLYPECTOMY;  Surgeon: Fields, Sandi L, MD;  [facility];  Service: Endoscopy;;  hepatic flexure  . PROSTATE BIOPSY      Social History   Socioeconomic History  . Marital status: Married    Spouse name: Not on file  . Number of children: 2  . Years of education: Not on file  . Highest education  level: Not on file  Occupational History  . Occupation: [employer]    Comment: full time; plans to retire again in May  Tobacco Use  . Smoking status: Former Smoker    Packs/day: 1.00    Years: 28.00    Pack years: 28.00    Types: Cigarettes    Quit date: 09/16/2000    Years since quitting: 20.1  . Smokeless tobacco: Never Used  Vaping Use  . Vaping Use: Never used  Substance and Sexual Activity  . Alcohol use: No  . Drug use: No  . Sexual activity: Yes  Other Topics Concern  . Not on file  Social History Narrative  . Not on file   Social Determinants of Health   Financial Resource Strain: Not on file  Food Insecurity: Not on file  Transportation Needs: Not on file  Physical Activity: Not on file  Stress: Not on file  Social Connections: Not on file  Intimate Partner Violence: Not on file    Family History  Problem Relation Age of Onset  . Hypertension Mother   . Stroke Father   . Prostate cancer Brother   .   Throat cancer Brother   . Prostate cancer Brother   . Colon cancer Neg Hx   . Colon polyps Neg Hx   . Breast cancer Neg Hx   . Pancreatic cancer Neg Hx     Anti-infectives: Anti-infectives (From admission, onward)   None      Current Outpatient Medications  Medication Sig Dispense Refill  . diclofenac (VOLTAREN) 75 MG EC tablet Take 75 mg by mouth 2 (two) times daily.    Marland Kitchen HYDROcodone-acetaminophen (NORCO) 10-325 MG tablet Take 1 tablet by mouth every 6 (six) hours as needed for moderate pain., 60 tablet 0  . methocarbamol (ROBAXIN) 500 MG tablet Take 500 mg by mouth 4 (four) times daily.    . methylPREDNISolone (MEDROL DOSEPAK) 4 MG TBPK tablet Take by mouth.    . Multiple Vitamin (MULTIVITAMIN WITH MINERALS) TABS tablet Take 1 tablet by mouth daily.    . tamsulosin (FLOMAX) 0.4 MG CAPS capsule TAKE 1 CAPSULE BY MOUTH DAILY 30 capsule 2   No current facility-administered medications for this visit.     Objective: Vital signs in last 24  hours: BP 134/83   Pulse 84   Temp 98.4 F (36.9 C)   Wt 213 lb (96.6 kg)   BMI 28.89 kg/m   Intake/Output from previous day: No intake/output data recorded. Intake/Output this shift: @IOTHISSHIFT @   Physical Exam Vitals reviewed.  Constitutional:      Appearance: Normal appearance.  Cardiovascular:     Rate and Rhythm: Normal rate and regular rhythm.     Heart sounds: Normal heart sounds.  Pulmonary:     Effort: Pulmonary effort is normal.     Breath sounds: Normal breath sounds.  Neurological:     Mental Status: He is alert.      Studies/Results: UA is clear.    Assessment/Plan: Prostate cancer:  T1c Nx Mx Gleason 7(3+4) prostate cancer.  He has elected a seed implant.    I reviewed the meds he needs to hold and answered his remaining questions.    Family history of prostate cancer.  He has 2 brothers with cancer.    BPH with BOO.  He is voiding ok on tamsulosin.   No orders of the defined types were placed in this encounter.    Orders Placed This Encounter  Procedures  . Urinalysis, Routine w reflex microscopic     Return in about 1 month (around 12/07/2020).    CC: Dr. Elvia Collum    Irine Seal 11/09/2020 [phone]

## 2020-11-09 ENCOUNTER — Encounter (HOSPITAL_COMMUNITY): Payer: Self-pay | Admitting: Physical Therapy

## 2020-11-09 ENCOUNTER — Other Ambulatory Visit: Payer: Self-pay

## 2020-11-09 ENCOUNTER — Ambulatory Visit (INDEPENDENT_AMBULATORY_CARE_PROVIDER_SITE_OTHER): Payer: PRIVATE HEALTH INSURANCE | Admitting: Urology

## 2020-11-09 ENCOUNTER — Ambulatory Visit (HOSPITAL_COMMUNITY): Payer: PRIVATE HEALTH INSURANCE | Admitting: Physical Therapy

## 2020-11-09 VITALS — BP 134/83 | HR 84 | Temp 98.4°F | Wt 213.0 lb

## 2020-11-09 DIAGNOSIS — R2689 Other abnormalities of gait and mobility: Secondary | ICD-10-CM

## 2020-11-09 DIAGNOSIS — C61 Malignant neoplasm of prostate: Secondary | ICD-10-CM

## 2020-11-09 DIAGNOSIS — M5442 Lumbago with sciatica, left side: Secondary | ICD-10-CM

## 2020-11-09 DIAGNOSIS — M6281 Muscle weakness (generalized): Secondary | ICD-10-CM

## 2020-11-09 NOTE — Progress Notes (Signed)
Urological Symptom Review  Patient is experiencing the following symptoms: Get up at night to urinate Leakage of urine   Review of Systems  Gastrointestinal (upper)  : Negative for upper GI symptoms  Gastrointestinal (lower) : Negative for lower GI symptoms  Constitutional : Negative for symptoms  Skin: Negative for skin symptoms  Eyes: Negative for eye symptoms  Ear/Nose/Throat : Negative for Ear/Nose/Throat symptoms  Hematologic/Lymphatic: Negative for Hematologic/Lymphatic symptoms  Cardiovascular : Negative for cardiovascular symptoms  Respiratory : Negative for respiratory symptoms  Endocrine: Negative for endocrine symptoms  Musculoskeletal: Back pain Joint pain  Neurological: Negative for neurological symptoms  Psychologic: Negative for psychiatric symptoms

## 2020-11-09 NOTE — Patient Instructions (Signed)
Access Code: [email or web address] URL: [email or web address] Date: 11/09/2020 Prepared by: Josue Hector  Exercises Squat with Chest Press - 1 x daily - 3 x weekly - 2 sets - 10 reps Standing Heel Raise with Support - 1 x daily - 3 x weekly - 2 sets - 10 reps Side Stepping with Resistance at Ankles - 1 x daily - 3 x weekly - 1 sets - 10 reps Forward Monster Walks - 1 x daily - 3 x weekly - 1 sets - 10 reps Backward Monster Walks - 1 x daily - 3 x weekly - 1 sets - 10 reps Single Leg Balance on Pillow - 1 x daily - 3 x weekly - 1 sets - 3 reps - 20-30 second hold Standing Anti-Rotation Press with Anchored Resistance - 1 x daily - 3 x weekly - 2 sets - 10 reps Standing Diagonal Lift with Anchored Resistance - 1 x daily - 3 x weekly - 2 sets - 10 reps Standing Shoulder Row with Anchored Resistance - 1 x daily - 3 x weekly - 2 sets - 10 reps Shoulder Extension with Resistance - 1 x daily - 3 x weekly - 2 sets - 10 reps

## 2020-11-09 NOTE — Therapy (Signed)
[place], [place], [address] Phone: [phone]   Fax:  [phone]  Physical Therapy Treatment  Patient Details  Name: Ricardo Pacheco MRN: [id number] Date of Birth: [birth year] Referring Provider (PT): Melina Schools, MD  PHYSICAL THERAPY DISCHARGE SUMMARY  Visits from Start of Care: 10  Current functional level related to goals / functional outcomes: See below    Remaining deficits: See below     Education / Equipment: See assessment  Plan: Patient agrees to discharge.  Patient goals were met. Patient is being discharged due to meeting the stated rehab goals.  ?????      Encounter Date: 11/09/2020   PT End of Session - 11/09/20 1729    Visit Number 10    Number of Visits 12    Date for PT Re-Evaluation 11/10/20    Authorization Type [employer], visit limit 63    Authorization - Visit Number 10    Authorization - Number of Visits 60    Progress Note Due on Visit 10    PT Start Time 3474    PT Stop Time 1815    PT Time Calculation (min) 50 min    Activity Tolerance Patient tolerated treatment well    Behavior During Therapy WFL for tasks assessed/performed           Past Medical History:  Diagnosis Date  . Arthritis   . Decreased white blood cell count   . Hypercholesteremia   . Prostate cancer ([place])   . Prostate cancer [facility])     Past Surgical History:  Procedure Laterality Date  . COLONOSCOPY    . COLONOSCOPY N/A 07/18/2017   Procedure: COLONOSCOPY;  Surgeon: Danie Binder, MD;  [facility];  Service: Endoscopy;  Laterality: N/A;  2:15 pm  . KNEE ARTHROPLASTY    . POLYPECTOMY  07/18/2017   Procedure: POLYPECTOMY;  Surgeon: Danie Binder, MD;  [facility];  Service: Endoscopy;;  hepatic flexure  . PROSTATE BIOPSY      There were no vitals filed for this visit.   Subjective Assessment - 11/09/20 1728    Subjective Patient says he is doing much better. His groin  is doing "great, far better than before". He was able to pump water out of boxes at work today with no back pain. He feels pretty good today, woke up feeling real good this morning. He reports 95% improvement since starting therapy.    Pertinent History prostate cancer, arthritis    Limitations Lifting;Standing;Walking    How long can you stand comfortably? 30 minutes    How long can you walk comfortably? 30 minutes    Patient Stated Goals decrease pain with work (ambulation)    Currently in Pain? Yes    Pain Score 1     Pain Location Back    Pain Orientation Posterior    Pain Descriptors / Indicators Aching    Pain Type Chronic pain    Pain Onset 1 to 4 weeks ago    Pain Frequency Intermittent    Aggravating Factors  bending, lifting    Pain Relieving Factors exericse, rest    Effect of Pain on Daily Activities Limiting    Pain Onset More than a month ago              [facility] PT Assessment - 11/09/20 0001      Assessment   Medical Diagnosis Low Back Pain Unspecified    Referring Provider (  PT) Melina Schools, MD      Restrictions   Weight Bearing Restrictions No      Balance Screen   Has the patient fallen in the past 6 months No      [place] residence      Prior Function   Level of Independence Independent      Cognition   Overall Cognitive Status Within Functional Limits for tasks assessed      Observation/Other Assessments   Focus on Therapeutic Outcomes (FOTO)  94% function      AROM   Lumbar Flexion 10% limited   was 75%   Lumbar Extension 25% limited   was 100%   Lumbar - Right Side Bend 10% limited   was 50%   Lumbar - Left Side Bend 10% limited   was 50%     Strength   Right Hip Flexion 5/5    Right Hip Extension 4+/5    Right Hip ABduction 4+/5    Left Hip Flexion 5/5   was 4+   Left Hip Extension 4+/5   was 3+   Left Hip ABduction 4+/5   was 4   Right Knee Extension 5/5    Left Knee Extension 4+/5   was 4                         [facility] Adult PT Treatment/Exercise - 11/09/20 0001      Lumbar Exercises: Aerobic   Recumbent Bike 4 min Lv 2 dynamic warmup      Lumbar Exercises: Standing   Other Standing Lumbar Exercises palloff press BTB 2 x 10 each, band lifts BTB 2 x 10 each      Knee/Hip Exercises: Standing   Functional Squat 2 sets;10 reps    Functional Squat Limitations with yellow med ball hold    SLS with Vectors 3 x 5" on foam    Other Standing Knee Exercises band sidestepping BTB 3RT, monster walks BTB 3RT                    PT Short Term Goals - 11/09/20 1741      PT SHORT TERM GOAL #1   Title Patient will report at least 25% improvement in symptoms for improved quality of life.    Baseline Reports 95%    Time 3    Period Weeks    Status Achieved    Target Date 10/20/20      PT SHORT TERM GOAL #2   Title Patient will be independent in self management strategies to improve quality of life and functional outcomes.    Baseline Reports compliance with, and demos good return with HEP    Time 3    Period Weeks    Status Achieved      PT SHORT TERM GOAL #3   Title Patient will ambulate 5 minutes before radicular pain begins allowing for improved functional mobility.    Baseline no radicular pain reported, is walking 20-30 minutes daily for work tasks    Time 3    Period Weeks    Status Achieved             PT Long Term Goals - 11/09/20 1742      PT LONG TERM GOAL #1   Title Reports 95%    Time 6    Period Weeks    Status Achieved      PT LONG  TERM GOAL #2   Title Patient will improve FOTO score in projected manner to demonstrate improvement in function and mobility.    Baseline Exceeds projection by 21%    Time 6    Period Weeks    Status Achieved      PT LONG TERM GOAL #3   Title Patient will ambulate for 10 minutes before radicular pain begins to allow for improved work function and improvement in functional mobility.    Baseline no  radicular pain reported, is walking 20-30 minutes daily for work tasks    Time 6    Period Weeks    Status Achieved                 Plan - 11/09/20 1827    Clinical Impression Statement Reassessment performed today. Patient has made very good progress and has currently met all goals. Reviewed HEP and issued updated HEP handout. Encouraged patient to follow up with therapy services with any further questions or concerns.    Personal Factors and Comorbidities Comorbidity 2    Comorbidities arthritis, history of DDD    Examination-Activity Limitations Bend;Carry;Stand;Stairs;Locomotion Level;Transfers    Examination-Participation Restrictions Driving;Yard Work;Occupation    Stability/Clinical Decision Making Stable/Uncomplicated    Rehab Potential Good    PT Frequency 2x / week    PT Duration 6 weeks    PT Treatment/Interventions ADLs/Self Care Home Management;Aquatic Therapy;Cryotherapy;Electrical Stimulation;Traction;Moist Heat;Iontophoresis 19m/ml Dexamethasone;Gait training;Stair training;Functional mobility training;Therapeutic activities;Therapeutic exercise;Balance training;Neuromuscular re-education;Manual techniques;Patient/family education;Passive range of motion;Dry needling;Energy conservation;Joint Manipulations;Spinal Manipulations;Taping    PT Next Visit Plan DC to HEP    PT Home Exercise Plan repeated flexion; 10/04/20: SKTC/LTR 1/27 iso hip abduction/ adduction, bridge 2/3 heel raise, sit/stand, band sidestepping 2/8 band rows and extensions with cue for ab brace 2/17 single leg stand on foam 2/22 increased band resistance to blue    Consulted and Agree with Plan of Care Patient           Patient will benefit from skilled therapeutic intervention in order to improve the following deficits and impairments:  Abnormal gait,Decreased mobility,Decreased strength,Hypomobility,Decreased range of motion,Difficulty walking,Pain  Visit Diagnosis: Left-sided low back pain with  left-sided sciatica, unspecified chronicity  Muscle weakness (generalized)  Other abnormalities of gait and mobility     Problem List Patient Active Problem List   Diagnosis Date Noted  . [place] 09/26/[place]  . Wellness examination 05/29/2020  . Elevated PSA 05/29/2020  . Pain in left knee 02/16/2020  . Acquired trigger finger of left little finger 05/14/2019  . Degeneration of lumbar intervertebral disc 12/05/2017  . Acquired trigger finger of right middle finger 10/10/2017  . Acquired trigger finger of right ring finger 10/10/2017  . Polyp of hepatic flexure of colon   . Prostate hypertrophy 12/11/2012  . Plantar fasciitis 12/11/2012  . Chronic back pain 12/11/2012    6:31 PM, 11/09/20 CJosue HectorPT DPT  Physical Therapist [facility] [phone]  [facility] [address]Phone: [phone]  Fax:  [phone] Name: JAziel MorganMRN: [id number] of Birth: 602-27-1957

## 2020-11-10 ENCOUNTER — Encounter: Payer: Self-pay | Admitting: Urology

## 2020-11-20 ENCOUNTER — Encounter (HOSPITAL_BASED_OUTPATIENT_CLINIC_OR_DEPARTMENT_OTHER): Payer: Self-pay | Admitting: Urology

## 2020-11-20 ENCOUNTER — Other Ambulatory Visit: Payer: Self-pay

## 2020-11-20 NOTE — Progress Notes (Signed)
Spoke w/ via phone for pre-op interview---pt Lab needs dos---- none              Lab results------has lab appt 11-22-2020 900 am for cbc cmet pt ptt, ekg and chest xray done 10-26-2020 epic COVID test ------11-22-2020 1000 Arrive at -------930 am 11-24-2020 NPO after MN NO Solid Food.  Clear liquids from MN until---830 Medications to take morning of surgery -----none Diabetic medication -----n/a Patient Special Instructions -----fleets enema am of surgery Pre-Op special Istructions -----none Patient verbalized understanding of instructions that were given at this phone interview. Patient denies shortness of breath, chest pain, fever, cough at this phone interview.

## 2020-11-21 ENCOUNTER — Telehealth: Payer: Self-pay | Admitting: *Deleted

## 2020-11-21 NOTE — Telephone Encounter (Signed)
CALLED PATIENT TO REMIND OF LABS AND COVID TESTING FOR 11-22-20, LVM FOR A RETURN CALL

## 2020-11-22 ENCOUNTER — Encounter (HOSPITAL_COMMUNITY)
Admission: RE | Admit: 2020-11-22 | Discharge: 2020-11-22 | Disposition: A | Discharge status: Home / Self Care | Payer: PRIVATE HEALTH INSURANCE | Source: Ambulatory Visit | Attending: Urology | Admitting: Urology

## 2020-11-22 ENCOUNTER — Other Ambulatory Visit (HOSPITAL_COMMUNITY)
Admission: RE | Admit: 2020-11-22 | Discharge: 2020-11-22 | Disposition: A | Discharge status: Home / Self Care | Payer: PRIVATE HEALTH INSURANCE | Source: Ambulatory Visit | Attending: Urology | Admitting: Urology

## 2020-11-22 ENCOUNTER — Other Ambulatory Visit: Payer: Self-pay

## 2020-11-22 DIAGNOSIS — Z20822 Contact with and (suspected) exposure to covid-19: Secondary | ICD-10-CM | POA: Insufficient documentation

## 2020-11-22 DIAGNOSIS — Z01812 Encounter for preprocedural laboratory examination: Secondary | ICD-10-CM | POA: Diagnosis present

## 2020-11-22 LAB — CBC
HCT: 41.3 % (ref 39.0–52.0)
Hemoglobin: 13.1 g/dL (ref 13.0–17.0)
MCH: 28 pg (ref 26.0–34.0)
MCHC: 31.7 g/dL (ref 30.0–36.0)
MCV: 88.2 fL (ref 80.0–100.0)
Platelets: 336 10*3/uL (ref 150–400)
RBC: 4.68 MIL/uL (ref 4.22–5.81)
RDW: 13.9 % (ref 11.5–15.5)
WBC: 3.3 10*3/uL — ABNORMAL LOW (ref 4.0–10.5)
nRBC: 0 % (ref 0.0–0.2)

## 2020-11-22 LAB — COMPREHENSIVE METABOLIC PANEL
ALT: 28 U/L (ref 0–44)
AST: 22 U/L (ref 15–41)
Albumin: 4 g/dL (ref 3.5–5.0)
Alkaline Phosphatase: 56 U/L (ref 38–126)
Anion gap: 8 (ref 5–15)
BUN: 10 mg/dL (ref 8–23)
CO2: 26 mmol/L (ref 22–32)
Calcium: 9.3 mg/dL (ref 8.9–10.3)
Chloride: 105 mmol/L (ref 98–111)
Creatinine, Ser: 0.78 mg/dL (ref 0.61–1.24)
GFR, Estimated: >60 mL/min (ref >60)
Glucose, Bld: 108 mg/dL — ABNORMAL HIGH (ref 70–99)
Potassium: 3.8 mmol/L (ref 3.5–5.1)
Sodium: 139 mmol/L (ref 135–145)
Total Bilirubin: 0.8 mg/dL (ref 0.3–1.2)
Total Protein: 7.5 g/dL (ref 6.5–8.1)

## 2020-11-22 LAB — APTT: aPTT: 34 seconds (ref 24–36)

## 2020-11-22 LAB — SARS CORONAVIRUS 2 (TAT 6-24 HRS): SARS Coronavirus 2: NEGATIVE

## 2020-11-22 LAB — PROTIME-INR
INR: 1 (ref 0.8–1.2)
Prothrombin Time: 13 seconds (ref 11.4–15.2)

## 2020-11-23 ENCOUNTER — Telehealth: Payer: Self-pay | Admitting: *Deleted

## 2020-11-23 NOTE — Telephone Encounter (Signed)
Called patient to remind of implant for 11-24-20, lvm  for a return call

## 2020-11-24 ENCOUNTER — Encounter (HOSPITAL_BASED_OUTPATIENT_CLINIC_OR_DEPARTMENT_OTHER)
Admission: RE | Disposition: A | Discharge status: Home / Self Care | Payer: Self-pay | Source: Home / Self Care | Attending: Urology

## 2020-11-24 ENCOUNTER — Ambulatory Visit (HOSPITAL_COMMUNITY): Payer: PRIVATE HEALTH INSURANCE

## 2020-11-24 ENCOUNTER — Other Ambulatory Visit: Payer: Self-pay

## 2020-11-24 ENCOUNTER — Encounter (HOSPITAL_BASED_OUTPATIENT_CLINIC_OR_DEPARTMENT_OTHER): Payer: Self-pay | Admitting: Urology

## 2020-11-24 ENCOUNTER — Ambulatory Visit (HOSPITAL_BASED_OUTPATIENT_CLINIC_OR_DEPARTMENT_OTHER): Payer: PRIVATE HEALTH INSURANCE | Admitting: Anesthesiology

## 2020-11-24 ENCOUNTER — Ambulatory Visit (HOSPITAL_BASED_OUTPATIENT_CLINIC_OR_DEPARTMENT_OTHER)
Admission: RE | Admit: 2020-11-24 | Discharge: 2020-11-24 | Disposition: A | Discharge status: Home / Self Care | Payer: PRIVATE HEALTH INSURANCE | Attending: Urology | Admitting: Urology

## 2020-11-24 DIAGNOSIS — N320 Bladder-neck obstruction: Secondary | ICD-10-CM | POA: Diagnosis not present

## 2020-11-24 DIAGNOSIS — Z8042 Family history of malignant neoplasm of prostate: Secondary | ICD-10-CM | POA: Diagnosis not present

## 2020-11-24 DIAGNOSIS — Z791 Long term (current) use of non-steroidal anti-inflammatories (NSAID): Secondary | ICD-10-CM | POA: Insufficient documentation

## 2020-11-24 DIAGNOSIS — C61 Malignant neoplasm of prostate: Secondary | ICD-10-CM | POA: Diagnosis present

## 2020-11-24 DIAGNOSIS — Z7952 Long term (current) use of systemic steroids: Secondary | ICD-10-CM | POA: Insufficient documentation

## 2020-11-24 DIAGNOSIS — Z87891 Personal history of nicotine dependence: Secondary | ICD-10-CM | POA: Diagnosis not present

## 2020-11-24 DIAGNOSIS — Z79899 Other long term (current) drug therapy: Secondary | ICD-10-CM | POA: Insufficient documentation

## 2020-11-24 DIAGNOSIS — N401 Enlarged prostate with lower urinary tract symptoms: Secondary | ICD-10-CM | POA: Diagnosis not present

## 2020-11-24 DIAGNOSIS — M549 Dorsalgia, unspecified: Secondary | ICD-10-CM | POA: Insufficient documentation

## 2020-11-24 DIAGNOSIS — G8929 Other chronic pain: Secondary | ICD-10-CM | POA: Insufficient documentation

## 2020-11-24 HISTORY — PX: SPACE OAR INSTILLATION: SHX6769

## 2020-11-24 HISTORY — DX: Presence of spectacles and contact lenses: Z97.3

## 2020-11-24 HISTORY — DX: Presence of dental prosthetic device (complete) (partial): Z97.2

## 2020-11-24 HISTORY — PX: RADIOACTIVE SEED IMPLANT: SHX5150

## 2020-11-24 SURGERY — INSERTION, RADIATION SOURCE, PROSTATE
Anesthesia: General | Site: Prostate

## 2020-11-24 MED ORDER — ACETAMINOPHEN 325 MG PO TABS: 650.0000 mg | ORAL_TABLET | ORAL | Status: DC | PRN

## 2020-11-24 MED ORDER — LIDOCAINE 2% (20 MG/ML) 5 ML SYRINGE
INTRAMUSCULAR | Status: DC | PRN
  Administered 2020-11-24: 100 mg via INTRAVENOUS

## 2020-11-24 MED ORDER — KETOROLAC TROMETHAMINE 30 MG/ML IJ SOLN
INTRAMUSCULAR | Status: AC
  Filled 2020-11-24: qty 1

## 2020-11-24 MED ORDER — SODIUM CHLORIDE 0.9 % IV SOLN: 250.0000 mL | INTRAVENOUS | Status: DC | PRN

## 2020-11-24 MED ORDER — SODIUM CHLORIDE 0.9 % IV SOLN
INTRAVENOUS | Status: AC | PRN
  Administered 2020-11-24: 1000 mL

## 2020-11-24 MED ORDER — DEXAMETHASONE SODIUM PHOSPHATE 10 MG/ML IJ SOLN
INTRAMUSCULAR | Status: DC | PRN
  Administered 2020-11-24: 10 mg via INTRAVENOUS

## 2020-11-24 MED ORDER — DEXAMETHASONE SODIUM PHOSPHATE 10 MG/ML IJ SOLN
INTRAMUSCULAR | Status: AC
  Filled 2020-11-24: qty 1

## 2020-11-24 MED ORDER — ONDANSETRON HCL 4 MG/2ML IJ SOLN
INTRAMUSCULAR | Status: AC
  Filled 2020-11-24: qty 2

## 2020-11-24 MED ORDER — FENTANYL CITRATE (PF) 100 MCG/2ML IJ SOLN
INTRAMUSCULAR | Status: AC
  Filled 2020-11-24: qty 2

## 2020-11-24 MED ORDER — CIPROFLOXACIN IN D5W 400 MG/200ML IV SOLN
400.0000 mg | INTRAVENOUS | Status: AC
  Administered 2020-11-24: 400 mg via INTRAVENOUS

## 2020-11-24 MED ORDER — SODIUM CHLORIDE (PF) 0.9 % IJ SOLN
INTRAMUSCULAR | Status: DC | PRN
  Administered 2020-11-24: 3 mL via INTRAVENOUS

## 2020-11-24 MED ORDER — CIPROFLOXACIN IN D5W 400 MG/200ML IV SOLN
INTRAVENOUS | Status: AC
  Filled 2020-11-24: qty 200

## 2020-11-24 MED ORDER — FENTANYL CITRATE (PF) 100 MCG/2ML IJ SOLN: 25.0000 ug | INTRAMUSCULAR | Status: DC | PRN

## 2020-11-24 MED ORDER — MIDAZOLAM HCL 2 MG/2ML IJ SOLN: 0.5000 mg | Freq: Once | INTRAMUSCULAR | Status: DC | PRN

## 2020-11-24 MED ORDER — PROMETHAZINE HCL 25 MG/ML IJ SOLN: 6.2500 mg | INTRAMUSCULAR | Status: DC | PRN

## 2020-11-24 MED ORDER — MIDAZOLAM HCL 5 MG/5ML IJ SOLN
INTRAMUSCULAR | Status: DC | PRN
  Administered 2020-11-24: 2 mg via INTRAVENOUS

## 2020-11-24 MED ORDER — EPHEDRINE SULFATE-NACL 50-0.9 MG/10ML-% IV SOSY
PREFILLED_SYRINGE | INTRAVENOUS | Status: DC | PRN
  Administered 2020-11-24 (×2): 10 mg via INTRAVENOUS

## 2020-11-24 MED ORDER — ONDANSETRON HCL 4 MG/2ML IJ SOLN
INTRAMUSCULAR | Status: DC | PRN
  Administered 2020-11-24: 4 mg via INTRAVENOUS

## 2020-11-24 MED ORDER — PROPOFOL 10 MG/ML IV BOLUS
INTRAVENOUS | Status: DC | PRN
  Administered 2020-11-24: 170 mg via INTRAVENOUS
  Administered 2020-11-24: 30 mg via INTRAVENOUS

## 2020-11-24 MED ORDER — LACTATED RINGERS IV SOLN: INTRAVENOUS | Status: DC

## 2020-11-24 MED ORDER — PROPOFOL 10 MG/ML IV BOLUS
INTRAVENOUS | Status: AC
  Filled 2020-11-24: qty 40

## 2020-11-24 MED ORDER — MEPERIDINE HCL 25 MG/ML IJ SOLN
6.2500 mg | INTRAMUSCULAR | Status: DC | PRN
Start: 2020-11-24 — End: 2020-11-24

## 2020-11-24 MED ORDER — OXYCODONE HCL 5 MG PO TABS: 5.0000 mg | ORAL_TABLET | ORAL | Status: DC | PRN

## 2020-11-24 MED ORDER — SODIUM CHLORIDE 0.9% FLUSH: 3.0000 mL | INTRAVENOUS | Status: DC | PRN

## 2020-11-24 MED ORDER — FLEET ENEMA 7-19 GM/118ML RE ENEM: 1.0000 | ENEMA | Freq: Once | RECTAL | Status: DC

## 2020-11-24 MED ORDER — OXYCODONE HCL 5 MG/5ML PO SOLN
5.0000 mg | Freq: Once | ORAL | Status: DC | PRN
Start: 2020-11-24 — End: 2020-11-24

## 2020-11-24 MED ORDER — KETOROLAC TROMETHAMINE 30 MG/ML IJ SOLN
INTRAMUSCULAR | Status: DC | PRN
  Administered 2020-11-24: 30 mg via INTRAVENOUS

## 2020-11-24 MED ORDER — EPHEDRINE 5 MG/ML INJ
INTRAVENOUS | Status: AC
  Filled 2020-11-24: qty 10

## 2020-11-24 MED ORDER — SODIUM CHLORIDE 0.9% FLUSH: 3.0000 mL | Freq: Two times a day (BID) | INTRAVENOUS | Status: DC

## 2020-11-24 MED ORDER — MIDAZOLAM HCL 2 MG/2ML IJ SOLN
INTRAMUSCULAR | Status: AC
  Filled 2020-11-24: qty 2

## 2020-11-24 MED ORDER — IOHEXOL 300 MG/ML  SOLN
INTRAMUSCULAR | Status: DC | PRN
  Administered 2020-11-24: 7 mL

## 2020-11-24 MED ORDER — FENTANYL CITRATE (PF) 100 MCG/2ML IJ SOLN
INTRAMUSCULAR | Status: DC | PRN
  Administered 2020-11-24: 50 ug via INTRAVENOUS
  Administered 2020-11-24 (×2): 25 ug via INTRAVENOUS

## 2020-11-24 MED ORDER — OXYCODONE HCL 5 MG PO TABS: 5.0000 mg | ORAL_TABLET | Freq: Once | ORAL | Status: DC | PRN

## 2020-11-24 MED ORDER — MORPHINE SULFATE (PF) 4 MG/ML IV SOLN: 2.0000 mg | INTRAVENOUS | Status: DC | PRN

## 2020-11-24 MED ORDER — STERILE WATER FOR IRRIGATION IR SOLN
Status: DC | PRN
  Administered 2020-11-24: 500 mL

## 2020-11-24 MED ORDER — ACETAMINOPHEN 325 MG RE SUPP: 650.0000 mg | RECTAL | Status: DC | PRN

## 2020-11-24 MED ORDER — LIDOCAINE 2% (20 MG/ML) 5 ML SYRINGE
INTRAMUSCULAR | Status: AC
  Filled 2020-11-24: qty 5

## 2020-11-24 SURGICAL SUPPLY — 40 items
Agx100 ×1 IMPLANT
BAG DRN RND TRDRP ANRFLXCHMBR (UROLOGICAL SUPPLIES) ×2
BAG URINE DRAIN 2000ML AR STRL (UROLOGICAL SUPPLIES) ×3 IMPLANT
BLADE CLIPPER SENSICLIP SURGIC (BLADE) ×3 IMPLANT
CATH FOLEY 2WAY SLVR  5CC 16FR (CATHETERS) ×3
CATH FOLEY 2WAY SLVR 5CC 16FR (CATHETERS) ×2 IMPLANT
CATH ROBINSON RED A/P 16FR (CATHETERS) IMPLANT
CATH ROBINSON RED A/P 20FR (CATHETERS) ×3 IMPLANT
CLOTH BEACON ORANGE TIMEOUT ST (SAFETY) ×3 IMPLANT
CNTNR URN SCR LID CUP LEK RST (MISCELLANEOUS) ×4 IMPLANT
CONT SPEC 4OZ STRL OR WHT (MISCELLANEOUS) ×6
COVER BACK TABLE 60X90IN (DRAPES) ×3 IMPLANT
COVER MAYO STAND STRL (DRAPES) ×3 IMPLANT
DRAPE C-ARM 35X43 STRL (DRAPES) ×3 IMPLANT
DRSG TEGADERM 4X4.75 (GAUZE/BANDAGES/DRESSINGS) ×5 IMPLANT
DRSG TEGADERM 8X12 (GAUZE/BANDAGES/DRESSINGS) ×6 IMPLANT
GAUZE SPONGE 4X4 12PLY STRL (GAUZE/BANDAGES/DRESSINGS) ×1 IMPLANT
GLOVE SURG ENC MOIS LTX SZ6.5 (GLOVE) ×3 IMPLANT
GLOVE SURG ENC MOIS LTX SZ7.5 (GLOVE) IMPLANT
GLOVE SURG ENC MOIS LTX SZ8 (GLOVE) IMPLANT
GLOVE SURG ORTHO LTX SZ8.5 (GLOVE) ×3 IMPLANT
GLOVE SURG POLYISO LF SZ6.5 (GLOVE) IMPLANT
GLOVE SURG POLYISO LF SZ8 (GLOVE) ×6 IMPLANT
GOWN STRL REUS W/TWL LRG LVL3 (GOWN DISPOSABLE) ×3 IMPLANT
GOWN STRL REUS W/TWL XL LVL3 (GOWN DISPOSABLE) ×3 IMPLANT
HOLDER FOLEY CATH W/STRAP (MISCELLANEOUS) ×3 IMPLANT
IMPL SPACEOAR VUE SYSTEM (Spacer) IMPLANT
IMPLANT SPACEOAR VUE SYSTEM (Spacer) ×3 IMPLANT
IV NS 1000ML (IV SOLUTION) ×3
IV NS 1000ML BAXH (IV SOLUTION) ×2 IMPLANT
KIT TURNOVER CYSTO (KITS) ×3 IMPLANT
MARKER SKIN DUAL TIP RULER LAB (MISCELLANEOUS) ×3 IMPLANT
PACK CYSTO (CUSTOM PROCEDURE TRAY) ×3 IMPLANT
SURGILUBE 2OZ TUBE FLIPTOP (MISCELLANEOUS) IMPLANT
SUT BONE WAX W31G (SUTURE) IMPLANT
SYR 10ML LL (SYRINGE) IMPLANT
TOWEL OR 17X26 10 PK STRL BLUE (TOWEL DISPOSABLE) ×3 IMPLANT
UNDERPAD 30X36 HEAVY ABSORB (UNDERPADS AND DIAPERS) ×6 IMPLANT
WATER STERILE IRR 3000ML UROMA (IV SOLUTION) ×3 IMPLANT
WATER STERILE IRR 500ML POUR (IV SOLUTION) ×3 IMPLANT

## 2020-11-24 NOTE — Anesthesia Procedure Notes (Signed)
Procedure Name: LMA Insertion Date/Time: 11/24/2020 11:39 AM Performed by: Bonney Aid, CRNA Pre-anesthesia Checklist: Patient identified, Emergency Drugs available, Suction available and Patient being monitored Patient Re-evaluated:Patient Re-evaluated prior to induction Oxygen Delivery Method: Circle system utilized Preoxygenation: Pre-oxygenation with 100% oxygen Induction Type: IV induction Ventilation: Mask ventilation without difficulty LMA: LMA inserted LMA Size: 5.0 Number of attempts: 1 Airway Equipment and Method: Bite block Placement Confirmation: positive ETCO2 Tube secured with: Tape Dental Injury: Teeth and Oropharynx as per pre-operative assessment

## 2020-11-24 NOTE — Progress Notes (Signed)
  Radiation Oncology         [phone]) [phone] ________________________________  Name: Detron Carras MRN: [id number]  Date: 11/24/2020  DOB: [birth year]       Prostate Seed Implant  PY:[id number], [name], DO  No ref. provider found  DIAGNOSIS:  65 y.o. gentleman with Stage T1c adenocarcinoma of the prostate with Gleason score of 3+4, and PSA of 4. Oncology History  Malignant neoplasm of prostate ([place])  06/30/2020 Cancer Staging   Staging form: Prostate, AJCC 8th Edition - Clinical stage from 06/30/2020: Stage IIB (cT1c, cN0, cM0, PSA: 4, Grade Group: 2) - Signed by Freeman Caldron, PA-C on 09/26/2020   09/26/2020 Initial Diagnosis   Malignant neoplasm of prostate ([place])     No diagnosis found.  PROCEDURE: Insertion of radioactive I-125 seeds into the prostate gland.  RADIATION DOSE: 145 Gy, definitive therapy.  TECHNIQUE: Willam Munford was brought to the operating room with the urologist. He was placed in the dorsolithotomy position. He was catheterized and a rectal tube was inserted. The perineum was shaved, prepped and draped. The ultrasound probe was then introduced into the rectum to see the prostate gland.  TREATMENT DEVICE: A needle grid was attached to the ultrasound probe stand and anchor needles were placed.  3D PLANNING: The prostate was imaged in 3D using a sagittal sweep of the prostate probe. These images were transferred to the planning computer. There, the prostate, urethra and rectum were defined on each axial reconstructed image. Then, the software created an optimized 3D plan and a few seed positions were adjusted. The quality of the plan was reviewed using [facility] information for the target and the following two organs at risk:  Urethra and Rectum.  Then the accepted plan was printed and handed off to the radiation therapist.  Under my supervision, the custom loading of the seeds and spacers was carried out and loaded into sealed vicryl sleeves.  These pre-loaded needles were then  placed into the needle holder.Marland Kitchen  PROSTATE VOLUME STUDY:  Using transrectal ultrasound the volume of the prostate was verified to be 33.3 cc.  SPECIAL TREATMENT PROCEDURE/SUPERVISION AND HANDLING: The pre-loaded needles were then delivered under sagittal guidance. A total of 19 needles were used to deposit 66 seeds in the prostate gland. The individual seed activity was 0.410 mCi.  SpaceOAR:  Yes  COMPLEX SIMULATION: At the end of the procedure, an anterior radiograph of the pelvis was obtained to document seed positioning and count. Cystoscopy was performed to check the urethra and bladder.  MICRODOSIMETRY: At the end of the procedure, the patient was emitting 0.06 mR/hr at 1 meter. Accordingly, he was considered safe for hospital discharge.  PLAN: The patient will return to the radiation oncology clinic for post implant CT dosimetry in three weeks.   ________________________________  Sheral Apley Tammi Klippel, M.D.

## 2020-11-24 NOTE — Anesthesia Preprocedure Evaluation (Addendum)
Anesthesia Evaluation  Patient identified by MRN, date of birth, ID band Patient awake    Reviewed: Allergy & Precautions, NPO status , Patient's Chart, lab work & pertinent test results  History of Anesthesia Complications Negative for: history of anesthetic complications  Airway Mallampati: II  TM Distance: >3 FB Neck ROM: Full    Dental  (+) Partial Lower, Dental Advisory Given, Caps, Missing   Pulmonary former smoker,  11/22/2020 SARS coronavirus NEG   breath sounds clear to auscultation       Cardiovascular (-) anginanegative cardio ROS   Rhythm:Regular Rate:Normal     Neuro/Psych negative neurological ROS     GI/Hepatic negative GI ROS, Neg liver ROS,   Endo/Other  negative endocrine ROS  Renal/GU negative Renal ROS   Prostate cancer    Musculoskeletal  (+) Arthritis ,   Abdominal   Peds  Hematology negative hematology ROS (+)   Anesthesia Other Findings   Reproductive/Obstetrics                            Anesthesia Physical Anesthesia Plan  ASA: II  Anesthesia Plan: General   Post-op Pain Management:    Induction: Intravenous  PONV Risk Score and Plan: 2 and Ondansetron and Dexamethasone  Airway Management Planned: LMA  Additional Equipment: None  Intra-op Plan:   Post-operative Plan:   Informed Consent: I have reviewed the patients History and Physical, chart, labs and discussed the procedure including the risks, benefits and alternatives for the proposed anesthesia with the patient or authorized representative who has indicated his/her understanding and acceptance.     Dental advisory given  Plan Discussed with: CRNA and Surgeon  Anesthesia Plan Comments:         Anesthesia Quick Evaluation

## 2020-11-24 NOTE — Interval H&P Note (Signed)
History and Physical Interval Note:  11/24/2020 10:02 AM  Ricardo Pacheco  has presented today for surgery, with the diagnosis of PROSTATE CANCER.  The various methods of treatment have been discussed with the patient and family. After consideration of risks, benefits and other options for treatment, the patient has consented to  Procedure(s): RADIOACTIVE SEED IMPLANT/BRACHYTHERAPY IMPLANT (N/A) SPACE OAR INSTILLATION (N/A) as a surgical intervention.  The patient's history has been reviewed, patient examined, no change in status, stable for surgery.  I have reviewed the patient's chart and labs.  Questions were answered to the patient's satisfaction.     Irine Seal

## 2020-11-24 NOTE — Anesthesia Postprocedure Evaluation (Signed)
Anesthesia Post Note  Patient: Ricardo Pacheco  Procedure(s) Performed: RADIOACTIVE SEED IMPLANT/BRACHYTHERAPY IMPLANT (N/A Prostate) SPACE OAR INSTILLATION (N/A Perineum)     Patient location during evaluation: PACU Anesthesia Type: General Level of consciousness: awake and alert, oriented and patient cooperative Pain management: pain level controlled Vital Signs Assessment: post-procedure vital signs reviewed and stable Respiratory status: spontaneous breathing, nonlabored ventilation and respiratory function stable Cardiovascular status: blood pressure returned to baseline and stable Postop Assessment: no apparent nausea or vomiting, able to ambulate and adequate PO intake Anesthetic complications: no   No complications documented.  Last Vitals:  Vitals:   11/24/20 1315 11/24/20 1327  BP: 122/77 121/83  Pulse: 73 80  Resp: 16   Temp:    SpO2: 98% 96%    Last Pain:  Vitals:   11/24/20 1327  TempSrc:   PainSc: 0-No pain                 Avonell Lenig,E. Shrihan Putt

## 2020-11-24 NOTE — Transfer of Care (Signed)
Immediate Anesthesia Transfer of Care Note  Patient: Ricardo Pacheco  Procedure(s) Performed: RADIOACTIVE SEED IMPLANT/BRACHYTHERAPY IMPLANT (N/A Prostate) SPACE OAR INSTILLATION (N/A Perineum)  Patient Location: PACU  Anesthesia Type:General  Level of Consciousness: awake, alert  and oriented  Airway & Oxygen Therapy: Patient Spontanous Breathing and Patient connected to nasal cannula oxygen  Post-op Assessment: Report given to RN  Post vital signs: Reviewed and stable  Last Vitals:  Vitals Value Taken Time  BP 131/84 11/24/20 1301  Temp    Pulse 88 11/24/20 1302  Resp 20 11/24/20 1302  SpO2 97 % 11/24/20 1302  Vitals shown include unvalidated device data.  Last Pain:  Vitals:   11/24/20 0945  TempSrc: Oral         Complications: No complications documented.

## 2020-11-24 NOTE — Discharge Instructions (Signed)
Post Anesthesia Home Care Instructions  Activity: Get plenty of rest for the remainder of the day. A responsible individual must stay with you for 24 hours following the procedure.  For the next 24 hours, DO NOT: -Drive a car -Paediatric nurse -Drink alcoholic beverages -Take any medication unless instructed by your physician -Make any legal decisions or sign important papers.  Meals: Start with liquid foods such as gelatin or soup. Progress to regular foods as tolerated. Avoid greasy, spicy, heavy foods. If nausea and/or vomiting occur, drink only clear liquids until the nausea and/or vomiting subsides. Call your physician if vomiting continues.  Special Instructions/Symptoms: Your throat may feel dry or sore from the anesthesia or the breathing tube placed in your throat during surgery. If this causes discomfort, gargle with warm salt water. The discomfort should disappear within 24 hours.  If you had a scopolamine patch placed behind your ear for the management of post- operative nausea and/or vomiting:  1. The medication in the patch is effective for 72 hours, after which it should be removed.  Wrap patch in a tissue and discard in the trash. Wash hands thoroughly with soap and water. 2. You may remove the patch earlier than 72 hours if you experience unpleasant side effects which may include dry mouth, dizziness or visual disturbances. 3. Avoid touching the patch. Wash your hands with soap and water after contact with the patch.    Brachytherapy for Prostate Cancer, Care After This sheet gives you information about how to care for yourself after your procedure. Your health care provider may also give you more specific instructions. If you have problems or questions, contact your health care provider. What can I expect after the procedure? After the procedure, it is common to have:  Urinary symptoms. These may include: ? Trouble passing urine. ? Blood in the urine or  semen. ? Frequent feeling of an urgent need to urinate.  Constipation, nausea, or bloating and gas.  Bruising, swelling, and tenderness of the area beneath the scrotum (perineum).  Tiredness (fatigue).  Burning or pain in the rectum.  Problems getting or keeping an erection (erectile dysfunction). Follow these instructions at home: Eating and drinking  Drink enough fluid to keep your urine pale yellow.  Eat a healthy, balanced diet. This includes lean proteins, whole grains, and plenty of fruits and vegetables.  If you drink alcohol: ? Limit how much you have to 0-2 drinks a day. ? Be aware of how much alcohol is in your drink. In the [place] one drink equals one 12 oz bottle of beer (355 mL), one 5 oz glass of wine (148 mL), or one 1 oz glass of hard liquor (44 mL).   Managing pain, stiffness, and swelling  If directed, put ice on the affected area. To do this: ? Put ice in a plastic bag. ? Place a towel between your skin and the bag. ? Leave the ice on for 20 minutes, 2-3 times a day.  Try not to sit directly on the perineum. A soft cushion can help with discomfort.   Activity  If you were given a sedative during the procedure, it can affect you for several hours. Do not drive or operate machinery until your health care provider says that it is safe.  Do not lift anything that is heavier than 10 lb (4.5 kg), or the limit that you are told, until your health care provider says that it is safe.  Rest as told by your health care  provider.  Return to your normal activities as told by your health care provider. Most people can return to normal activities a few days or weeks after the procedure. Ask your health care provider what activities are safe for you.   Treatment area care Check your treatment area every day for signs of infection. Check for:  Redness, swelling, or pain.  Fluid or blood.  Warmth.  Pus or a bad smell. Managing constipation Your procedure may cause  constipation. To prevent or treat constipation, you may need to:  Take over-the-counter or prescription medicines.  Eat foods that are high in fiber, such as beans, whole grains, and fresh fruits and vegetables.  Limit foods that are high in fat and processed sugars, such as fried or sweet foods. General instructions  Take over-the-counter and prescription medicines only as told by your health care provider.  Do not take baths, swim, or use a hot tub until your health care provider approves. Shower and wash the perineum gently.  Do not have sex for one week after the treatment, or until your health care provider approves.  Do not use any products that contain nicotine or tobacco, such as cigarettes, e-cigarettes, and chewing tobacco. If you need help quitting, ask your health care provider.  If you have permanent, low-dose brachytherapy implants: ? Limit close contact with children and pregnant women for 2 months or as told by your health care provider. This is important because of the radiation that is still active in the prostate. ? You may set off radioactive sensors, such as at airport screenings. Ask your health care provider for a document that explains your treatment. ? You may be told to use a condom during sex for the first 2 months after low-dose brachytherapy.  Keep all follow-up visits as told by your health care provider. This is important. You may still need additional treatment. Contact a health care provider if you:  Have a fever or chills.  Have any of these signs of infection in the treatment area: ? Redness, swelling, or pain. ? Fluid or blood. ? Warmth. ? Pus or a bad smell.  Have no bowel movements for 3-4 days after the procedure.  Have diarrhea for 3-4 days after the procedure.  Develop any new symptoms, such as problems with urinating or erectile dysfunction.  Have pain in your abdomen.  Have more blood in your urine.  Have swelling or pain in your  legs. Get help right away if:  You cannot urinate.  You have a lot of bleeding from your rectum.  You have unusual drainage coming from your rectum.  You have severe pain in the treated area that does not go away with pain medicine.  You have severe nausea or vomiting.  You have difficulty breathing. Summary  Talk with your health care provider about your risk of brachytherapy side effects, such as erectile dysfunction or urinary problems.  If you have permanent, low-dose brachytherapy implants, limit close contact with children and pregnant women for 2 months or as told by your health care provider. This is important because of the radiation that is still active in the prostate.  You may be told to use a condom during sex for the first 2 months after low-dose brachytherapy.  Keep all follow-up visits as told by your health care provider. This is important. You may need additional treatment. This information is not intended to replace advice given to you by your health care provider. Make sure you discuss any  questions you have with your health care provider. Document Revised: 07/05/2019 Document Reviewed: 07/05/2019 Elsevier Patient Education  [place].

## 2020-11-24 NOTE — Op Note (Signed)
PATIENT:  Ricardo Pacheco  PRE-OPERATIVE DIAGNOSIS:  Adenocarcinoma of the prostate  POST-OPERATIVE DIAGNOSIS:  Same  PROCEDURE:  Procedure(s): 1. I-125 radioactive seed implantation 2. SpaceOAR implantation. 3.  Cystoscopy  SURGEON:  Surgeon(s): Irine Seal MD  Radiation oncologist: Dr. Tyler Pita  ANESTHESIA:  General  EBL:  Minimal  DRAINS: 63 French Foley catheter  INDICATION: Ricardo Pacheco is a 65 y.o. with Stage T1c, Gleason 7(3+4)  prostate cancer who has elected brachytherapy for treatment.  Description of procedure: After informed consent the patient was brought to the major OR, placed on the table and administered general anesthesia. He was then moved to the modified lithotomy position with his perineum perpendicular to the floor. His perineum and genitalia were then sterilely prepped. An official timeout was then performed. A 16 French Foley catheter was then placed in the bladder and filled with dilute contrast, a rectal tube was placed in the rectum and the transrectal ultrasound probe was placed in the rectum and affixed to the stand. He was then sterilely draped.  The sterile grid was installed.   Anchor needles were then placed.   Real time ultrasonography was used along with the seed planning software spot-pro version 3.1-00. This was used to develop the seed plan including the number of needles as well as number of seeds required for complete and adequate coverage. Real-time ultrasonography was then used along with the previously developed plan  to implant a total of 66 seeds using 19 needles for a target dose of 145 Gy. This proceeded without difficulty or complication.  The anchor needles and guide were removed and the SpaceOAR needle was passed under US guidance into the fat stripe posterior to the prostate with the tip in the midline at mid prostate. A puff of NS confirmed appropriate positioning and aspiration confirmed no blood return.   the [facility] polymer  was then injected over 12 seconds into the space with excellent distribution.     A Foley catheter was then removed as well as the transrectal ultrasound probe and rectal probe. Flexible cystoscopy was then performed using the 17 French flexible scope which revealed a normal urethra throughout its length down to the sphincter which appeared intact. The prostatic urethra was short with minimal prostatic hyperplasia. The bladder was then entered and fully and systematically inspected.  The ureteral orifices were noted to be of normal configuration and position. The mucosa revealed no evidence of tumors. There were also no stones identified within the bladder.  No seeds or spacers were seen and/or removed from the bladder.  The cystoscope was then removed.  The drapes were removed.  The perineum was cleaned and dressed.  He was taken out of the lithotomy position and was awakened and taken to recovery room in stable and satisfactory condition. He tolerated procedure well and there were no intraoperative complications.

## 2020-11-27 ENCOUNTER — Encounter (HOSPITAL_BASED_OUTPATIENT_CLINIC_OR_DEPARTMENT_OTHER): Payer: Self-pay | Admitting: Urology

## 2020-12-04 NOTE — Progress Notes (Signed)
Subjective: 1. Prostate cancer ([place])   2. BPH with urinary obstruction   3. Nocturia      Ricardo Pacheco returns in f/u from a seed implant on 11/25/19.  He has.  He has T1c Nx Mx prostate cancer with 2 cores of Gleason 7(3+4) disease with >50% involvement with one on each side  and 8 cores of Gleason 6 disease with up to 80% involvement with 4 on each side.   He is doing well post op and has no significant associated signs or symptoms.  He has had well preserved erectile function but has not had any attempts since surgery.   He has BPH with BOO and is on tamsulosin with moderate LUTS.  His IPSS is 6 with nocturia x 3.      Gu hx: Ricardo Pacheco is a 65 yo AAM who is sent by Elvia Collum DO for an elevated PSA of 4.6 on 05/24/20 which is up from 2.5 in 10/20 and 2.4 in 9/19.  He has moderate LUTS with some urgency and nocturia that is worse with fluids.  His IPSS is 12.  He has been on tamsulosin for several years.  He is on chronic hydrocodone for chronic back pain but he only takes it intermittently when the epidurals wear off.  He has no history of UTI's, stone or GU surgery.  He has two brother with prostate cancer in their upper 50's.    ROS:  ROS  No Known Allergies  Past Medical History:  Diagnosis Date  . Arthritis    djd back back brace prn  . Decreased white blood cell count 2018   resolved   . Hypercholesteremia    no meds taken  . Prostate cancer ([place])   . Prostate cancer ([place])   . Wears glasses   . Wears partial dentures    lower    Past Surgical History:  Procedure Laterality Date  . COLONOSCOPY N/A 07/18/2017   Procedure: COLONOSCOPY;  Surgeon: Danie Binder, MD;  [facility];  Service: Endoscopy;  Laterality: N/A;  2:15 pm  . MENISCUS REPAIR Left 03/2020   [facility]  . POLYPECTOMY  07/18/2017   Procedure: POLYPECTOMY;  Surgeon: Danie Binder, MD;  Location: [facility];  Service: Endoscopy;;  hepatic flexure  . PROSTATE BIOPSY     in  office  . RADIOACTIVE SEED IMPLANT N/A 11/24/2020   Procedure: RADIOACTIVE SEED IMPLANT/BRACHYTHERAPY IMPLANT;  Surgeon: Irine Seal, MD;  Location: [facility];  Service: Urology;  Laterality: N/A;  . SPACE OAR INSTILLATION N/A 11/24/2020   Procedure: SPACE OAR INSTILLATION;  Surgeon: Irine Seal, MD;  Location: [facility];  Service: Urology;  Laterality: N/A;    Social History   Socioeconomic History  . Marital status: Married    Spouse name: Not on file  . Number of children: 2  . Years of education: Not on file  . Highest education level: Not on file  Occupational History  . Occupation: [employer] [place]    Comment: full time; plans to retire again in May  Tobacco Use  . Smoking status: Former Smoker    Packs/day: 1.00    Years: 28.00    Pack years: 28.00    Types: Cigarettes    Quit date: 09/16/2000    Years since quitting: 20.2  . Smokeless tobacco: Never Used  Vaping Use  . Vaping Use: Never used  Substance and Sexual Activity  . Alcohol use: No  .  Drug use: No  . Sexual activity: Yes  Other Topics Concern  . Not on file  Social History Narrative  . Not on file   Social Determinants of Health   Financial Resource Strain: Not on file  Food Insecurity: Not on file  Transportation Needs: Not on file  Physical Activity: Not on file  Stress: Not on file  Social Connections: Not on file  Intimate Partner Violence: Not on file    Family History  Problem Relation Age of Onset  . Hypertension Mother   . Stroke Father   . Prostate cancer Brother   . Throat cancer Brother   . Prostate cancer Brother   . Colon cancer Neg Hx   . Colon polyps Neg Hx   . Breast cancer Neg Hx   . Pancreatic cancer Neg Hx     Anti-infectives: Anti-infectives (From admission, onward)   None      Current Outpatient Medications  Medication Sig Dispense Refill  . diclofenac (VOLTAREN) 75 MG EC tablet Take 75 mg by mouth 2 (two) times daily.    Marland Kitchen  HYDROcodone-acetaminophen (NORCO) 10-325 MG tablet Take 1 tablet by mouth every 6 (six) hours as needed for moderate pain., 60 tablet 0  . methocarbamol (ROBAXIN) 500 MG tablet Take 500 mg by mouth 4 (four) times daily.    . Multiple Vitamin (MULTIVITAMIN WITH MINERALS) TABS tablet Take 1 tablet by mouth daily.    . tamsulosin (FLOMAX) 0.4 MG CAPS capsule Take 1 capsule (0.4 mg total) by mouth daily after supper. 90 capsule 3   No current facility-administered medications for this visit.     Objective: Vital signs in last 24 hours: BP 124/75   Pulse 80   Temp 98.4 F (36.9 C) (Oral)   Ht 6\' 1"  (1.854 m)   Wt 215 lb (97.5 kg)   BMI 28.37 kg/m   Intake/Output from previous day: No intake/output data recorded. Intake/Output this shift: @IOTHISSHIFT @   Physical Exam   Studies/Results:    Assessment/Plan: Prostate cancer:  T1c Nx Mx Gleason 7(3+4) prostate cancer.  He is doing well s/p seeds.  I will have him return in 4 months with a PSA.      BPH with BOO.  He is voiding ok on tamsulosin which I refilled.   Meds ordered this encounter  Medications  . tamsulosin (FLOMAX) 0.4 MG CAPS capsule    Sig: Take 1 capsule (0.4 mg total) by mouth daily after supper.    Dispense:  90 capsule    Refill:  3     Orders Placed This Encounter  Procedures  . Urinalysis, Routine w reflex microscopic     Return in about 4 months (around 04/08/2021) for with a PSA.    CC: Dr. Elvia Collum    Irine Seal 12/07/2020 [phone]

## 2020-12-07 ENCOUNTER — Other Ambulatory Visit: Payer: Self-pay

## 2020-12-07 ENCOUNTER — Encounter: Payer: Self-pay | Admitting: Urology

## 2020-12-07 ENCOUNTER — Ambulatory Visit (INDEPENDENT_AMBULATORY_CARE_PROVIDER_SITE_OTHER): Payer: PRIVATE HEALTH INSURANCE | Admitting: Urology

## 2020-12-07 VITALS — BP 124/75 | HR 80 | Temp 98.4°F | Ht 73.0 in | Wt 215.0 lb

## 2020-12-07 DIAGNOSIS — C61 Malignant neoplasm of prostate: Secondary | ICD-10-CM

## 2020-12-07 DIAGNOSIS — N138 Other obstructive and reflux uropathy: Secondary | ICD-10-CM

## 2020-12-07 DIAGNOSIS — R351 Nocturia: Secondary | ICD-10-CM

## 2020-12-07 DIAGNOSIS — N401 Enlarged prostate with lower urinary tract symptoms: Secondary | ICD-10-CM

## 2020-12-07 MED ORDER — TAMSULOSIN HCL 0.4 MG PO CAPS: 0.4000 mg | ORAL_CAPSULE | Freq: Every day | ORAL | 3 refills | Status: DC

## 2020-12-18 ENCOUNTER — Other Ambulatory Visit: Payer: Self-pay

## 2020-12-18 ENCOUNTER — Encounter: Payer: Self-pay | Admitting: Urology

## 2020-12-18 ENCOUNTER — Telehealth: Payer: Self-pay

## 2020-12-18 NOTE — Telephone Encounter (Signed)
Spoke with patient in regards to post seed appointment with Reather Littler PA on 12/20/20 @ 8:30am. Patient verbalized understanding of appointment date and time. Called to review meaningful use questions prior to appointment. TM

## 2020-12-18 NOTE — Telephone Encounter (Signed)
Left voicemail message in regards to post seed appointment with Freeman Caldron PA on 12/20/20 @ 8:30am. Called to review meaningful use prior to appointment. TM

## 2020-12-19 ENCOUNTER — Telehealth: Payer: Self-pay | Admitting: *Deleted

## 2020-12-19 NOTE — Telephone Encounter (Signed)
CALLED PATIENT TO REMIND OF POST SEED APPTS. FOR 12-20-20, SPOKE WITH PATIENT AND HE IS AWARE OF THESE APPTS.

## 2020-12-20 ENCOUNTER — Other Ambulatory Visit: Payer: Self-pay

## 2020-12-20 ENCOUNTER — Ambulatory Visit
Admission: RE | Admit: 2020-12-20 | Discharge: 2020-12-20 | Disposition: A | Discharge status: Home / Self Care | Payer: PRIVATE HEALTH INSURANCE | Source: Ambulatory Visit | Attending: Radiation Oncology | Admitting: Radiation Oncology

## 2020-12-20 ENCOUNTER — Ambulatory Visit
Admission: RE | Admit: 2020-12-20 | Discharge: 2020-12-20 | Disposition: A | Discharge status: Home / Self Care | Payer: PRIVATE HEALTH INSURANCE | Source: Ambulatory Visit | Attending: Urology | Admitting: Urology

## 2020-12-20 VITALS — BP 124/79 | HR 79 | Temp 97.6°F | Resp 18 | Ht 73.0 in | Wt 209.8 lb

## 2020-12-20 DIAGNOSIS — Z923 Personal history of irradiation: Secondary | ICD-10-CM | POA: Insufficient documentation

## 2020-12-20 DIAGNOSIS — R3911 Hesitancy of micturition: Secondary | ICD-10-CM | POA: Insufficient documentation

## 2020-12-20 DIAGNOSIS — Z79899 Other long term (current) drug therapy: Secondary | ICD-10-CM | POA: Insufficient documentation

## 2020-12-20 DIAGNOSIS — R350 Frequency of micturition: Secondary | ICD-10-CM | POA: Insufficient documentation

## 2020-12-20 DIAGNOSIS — Z791 Long term (current) use of non-steroidal anti-inflammatories (NSAID): Secondary | ICD-10-CM | POA: Insufficient documentation

## 2020-12-20 DIAGNOSIS — C61 Malignant neoplasm of prostate: Secondary | ICD-10-CM | POA: Insufficient documentation

## 2020-12-20 NOTE — Progress Notes (Signed)
  Radiation Oncology         [phone] ________________________________  Name: Ricardo Pacheco MRN: [id number]  Date: 12/20/2020  DOB: 04-04-1956  COMPLEX SIMULATION NOTE  NARRATIVE:  The patient was brought to the [facility] today following prostate seed implantation approximately one month ago.  Identity was confirmed.  All relevant records and images related to the planned course of therapy were reviewed.  Then, the patient was set-up supine.  CT images were obtained.  The CT images were loaded into the planning software.  Then the prostate and rectum were contoured.  Treatment planning then occurred.  The implanted iodine 125 seeds were identified by the physics staff for projection of radiation distribution  I have requested : 3D Simulation  I have requested a DVH of the following structures: Prostate and rectum.    ________________________________  Sheral Apley Tammi Klippel, M.D.

## 2020-12-20 NOTE — Progress Notes (Signed)
Patient has post seed appointment today. Patient states that he saw the urologist Dr. Jeffie Pollock in March and has another appointment scheduled in August for a PSA. Patient denies having pain today. Patient denies hematuria or dysuria.

## 2020-12-20 NOTE — Progress Notes (Signed)
Radiation Oncology         [phone] ________________________________  Name: Ricardo Pacheco MRN: [id number]  Date: 12/20/2020  DOB: 01-13-1956  Post-Seed Follow-Up Visit Note  CC: Erven Colla, DO  Raynelle Bring, MD  Diagnosis:   65 y.o. gentleman with Stage T1c adenocarcinoma of the prostate with Gleason score of 3+4, and PSA of 4.    ICD-10-CM   1. Malignant neoplasm of prostate ([place])  C61     Interval Since Last Radiation:  3.5 weeks 11/24/20:  Insertion of radioactive I-125 seeds into the prostate gland; 145 Gy, definitive therapy with placement of SpaceOAR gel.  Narrative:  The patient returns today for routine follow-up.  He is complaining of increased urinary frequency and urinary hesitation symptoms. He filled out a questionnaire regarding urinary function today providing and overall IPSS score of 10 characterizing his symptoms as mild-moderate with mild residual increased frequency, urgency and nocturia x3.  He specifically denies dysuria, gross hematuria, straining to void, incomplete bladder emptying or incontinence.  His pre-implant score was 11. He denies any abdominal pain or bowel symptoms.  He has not noticed any significant change in his energy level and overall, is quite pleased with his progress to date.  ALLERGIES:  has No Known Allergies.  Meds: Current Outpatient Medications  Medication Sig Dispense Refill  . diclofenac (VOLTAREN) 75 MG EC tablet Take 75 mg by mouth 2 (two) times daily.    Marland Kitchen HYDROcodone-acetaminophen (NORCO) 10-325 MG tablet Take 1 tablet by mouth every 6 (six) hours as needed for moderate pain., 60 tablet 0  . methocarbamol (ROBAXIN) 500 MG tablet Take 500 mg by mouth 4 (four) times daily.    . Multiple Vitamin (MULTIVITAMIN WITH MINERALS) TABS tablet Take 1 tablet by mouth daily.    . tamsulosin (FLOMAX) 0.4 MG CAPS capsule Take 1 capsule (0.4 mg total) by mouth daily after supper. 90 capsule 3   No current facility-administered  medications for this encounter.    Physical Findings: In general this is a well appearing African American male in no acute distress. He's alert and oriented x4 and appropriate throughout the examination. Cardiopulmonary assessment is negative for acute distress and he exhibits normal effort.   Lab Findings: Lab Results  Component Value Date   WBC 3.3 (L) 11/22/2020   HGB 13.1 11/22/2020   HCT 41.3 11/22/2020   MCV 88.2 11/22/2020   PLT 336 11/22/2020    Radiographic Findings:  Patient underwent CT imaging in our clinic for post implant dosimetry. The CT will be reviewed by Dr. Tammi Klippel to confirm there is an adequate distribution of radioactive seeds throughout the prostate gland and ensure that there are no seeds in or near the rectum.  We suspect the final radiation plan and dosimetry will show appropriate coverage of the prostate gland. He understands that we will call and inform him of any unexpected findings on further review of his imaging and dosimetry.  Impression/Plan: 65 y.o. gentleman with Stage T1c adenocarcinoma of the prostate with Gleason score of 3+4, and PSA of 4. The patient is recovering from the effects of radiation. His urinary symptoms should gradually improve over the next 4-6 months. We talked about this today. He is encouraged by his improvement already and is otherwise pleased with his outcome. We also talked about long-term follow-up for prostate cancer following seed implant. He understands that ongoing PSA determinations and digital rectal exams will help perform surveillance to rule out disease recurrence. He had a follow up  appointment with Dr. Jeffie Pollock on 12/07/20 and will see him again on 04/12/21 with his first post-treatment PSA. He understands what to expect with his PSA measures. Patient was also educated today about some of the long-term effects from radiation including a small risk for rectal bleeding and possibly erectile dysfunction. We talked about some of the  general management approaches to these potential complications. However, I did encourage the patient to contact our office or return at any point if he has questions or concerns related to his previous radiation and prostate cancer.    Nicholos Johns, PA-C

## 2020-12-27 ENCOUNTER — Encounter: Payer: Self-pay | Admitting: Radiation Oncology

## 2020-12-27 DIAGNOSIS — C61 Malignant neoplasm of prostate: Secondary | ICD-10-CM | POA: Diagnosis not present

## 2021-01-22 NOTE — Progress Notes (Signed)
  Radiation Oncology         [phone] ________________________________  Name: Ricardo Pacheco MRN: [id number]  Date: 12/27/2020  DOB: [birth year]  3D Planning Note   Prostate Brachytherapy Post-Implant Dosimetry  Diagnosis:  65 y.o. gentleman with Stage T1c adenocarcinoma of the prostate with Gleason score of 3+4, and PSA of 4.  Narrative: On a previous date, Ricardo Pacheco returned following prostate seed implantation for post implant planning. He underwent CT scan complex simulation to delineate the three-dimensional structures of the pelvis and demonstrate the radiation distribution.  Since that time, the seed localization, and complex isodose planning with dose volume histograms have now been completed.  Results:   Prostate Coverage - The dose of radiation delivered to the 90% or more of the prostate gland (D90) was 97.17% of the prescription dose. This exceeds our goal of greater than 90%. Rectal Sparing - The volume of rectal tissue receiving the prescription dose or higher was 0.0 cc. This falls under our thresholds tolerance of 1.0 cc.  Impression: The prostate seed implant appears to show adequate target coverage and appropriate rectal sparing.  Plan:  The patient will continue to follow with urology for ongoing PSA determinations. I would anticipate a high likelihood for local tumor control with minimal risk for rectal morbidity.  ________________________________  Sheral Apley Tammi Klippel, M.D.

## 2021-04-05 ENCOUNTER — Other Ambulatory Visit: Payer: PRIVATE HEALTH INSURANCE

## 2021-04-05 ENCOUNTER — Other Ambulatory Visit: Payer: Self-pay

## 2021-04-05 DIAGNOSIS — C61 Malignant neoplasm of prostate: Secondary | ICD-10-CM

## 2021-04-06 LAB — PSA: Prostate Specific Ag, Serum: 1.6 ng/mL (ref 0.0–4.0)

## 2021-04-12 ENCOUNTER — Encounter: Payer: Self-pay | Admitting: Urology

## 2021-04-12 ENCOUNTER — Other Ambulatory Visit: Payer: Self-pay

## 2021-04-12 ENCOUNTER — Ambulatory Visit (INDEPENDENT_AMBULATORY_CARE_PROVIDER_SITE_OTHER): Payer: Medicare Other | Admitting: Urology

## 2021-04-12 VITALS — BP 109/72 | HR 66 | Temp 98.1°F | Wt 212.4 lb

## 2021-04-12 DIAGNOSIS — R351 Nocturia: Secondary | ICD-10-CM

## 2021-04-12 DIAGNOSIS — C61 Malignant neoplasm of prostate: Secondary | ICD-10-CM | POA: Diagnosis not present

## 2021-04-12 DIAGNOSIS — N401 Enlarged prostate with lower urinary tract symptoms: Secondary | ICD-10-CM

## 2021-04-12 DIAGNOSIS — N138 Other obstructive and reflux uropathy: Secondary | ICD-10-CM | POA: Diagnosis not present

## 2021-04-12 NOTE — Progress Notes (Signed)
Subjective: 1. Prostate cancer ([place])   2. BPH with urinary obstruction   3. Nocturia      Ricardo Pacheco returns in f/u from a seed implant on 11/24/20.  He has.  He has T1c Nx Mx prostate cancer with 2 cores of Gleason 7(3+4) disease with >50% involvement with one on each side  and 8 cores of Gleason 6 disease with up to 80% involvement with 4 on each side.   He is doing well post op and has no significant associated signs or symptoms.  His PSA is down to 1.6 from 4.0 in 10/21.   He has had well preserved erectile function but has not had any attempts since surgery.   He has BPH with BOO and is on tamsulosin with moderate LUTS.  His IPSS is 12 with nocturia x 4.      Gu hx: Ricardo Pacheco is a 65 yo AAM who is sent by Elvia Collum DO for an elevated PSA of 4.6 on 05/24/20 which is up from 2.5 in 10/20 and 2.4 in 9/19.  He has moderate LUTS with some urgency and nocturia that is worse with fluids.  His IPSS is 12.  He has been on tamsulosin for several years.  He is on chronic hydrocodone for chronic back pain but he only takes it intermittently when the epidurals wear off.  He has no history of UTI's, stone or GU surgery.  He has two brother with prostate cancer in their upper 57's.    ROS:  ROS  No Known Allergies  Past Medical History:  Diagnosis Date   Arthritis    djd back back brace prn   Decreased white blood cell count 2018   resolved    Hypercholesteremia    no meds taken   Prostate cancer ([place])    Prostate cancer ([place])    Wears glasses    Wears partial dentures    lower    Past Surgical History:  Procedure Laterality Date   COLONOSCOPY N/A 07/18/2017   Procedure: COLONOSCOPY;  Surgeon: Danie Binder, MD;  [facility];  Service: Endoscopy;  Laterality: N/A;  2:15 pm   MENISCUS REPAIR Left 03/2020   [facility]   POLYPECTOMY  07/18/2017   Procedure: POLYPECTOMY;  Surgeon: Danie Binder, MD;  [facility]: [facility];  Service: Endoscopy;;  hepatic  flexure   PROSTATE BIOPSY     in office   RADIOACTIVE SEED IMPLANT N/A 11/24/2020   Procedure: RADIOACTIVE SEED IMPLANT/BRACHYTHERAPY IMPLANT;  Surgeon: Irine Seal, MD;  Location: [facility];  Service: Urology;  Laterality: N/A;   SPACE OAR INSTILLATION N/A 11/24/2020   Procedure: SPACE OAR INSTILLATION;  Surgeon: Irine Seal, MD;  Location: [facility];  Service: Urology;  Laterality: N/A;    Social History   Socioeconomic History   Marital status: Married    Spouse name: Not on file   Number of children: 2   Years of education: Not on file   Highest education level: Not on file  Occupational History   Occupation: [employer]    Comment: full time; plans to retire again in May  Tobacco Use   Smoking status: Former    Packs/day: 1.00    Years: 28.00    Pack years: 28.00    Types: Cigarettes    Quit date: 09/16/2000    Years since quitting: 20.5   Smokeless tobacco: Never  Vaping Use   Vaping Use: Never used  Substance and Sexual Activity   Alcohol use: No   Drug use: No   Sexual activity: Yes  Other Topics Concern   Not on file  Social History Narrative   Not on file   Social Determinants of Health   Financial Resource Strain: Not on file  Food Insecurity: Not on file  Transportation Needs: Not on file  Physical Activity: Not on file  Stress: Not on file  Social Connections: Not on file  Intimate Partner Violence: Not on file    Family History  Problem Relation Age of Onset   Hypertension Mother    Stroke Father    Prostate cancer Brother    Throat cancer Brother    Prostate cancer Brother    Colon cancer Neg Hx    Colon polyps Neg Hx    Breast cancer Neg Hx    Pancreatic cancer Neg Hx     Anti-infectives: Anti-infectives (From admission, onward)    None       Current Outpatient Medications  Medication Sig Dispense Refill   diclofenac (VOLTAREN) 75 MG EC tablet Take 75 mg by mouth 2 (two) times daily. (Patient  not taking: Reported on 04/12/2021)     HYDROcodone-acetaminophen (NORCO) 10-325 MG tablet Take 1 tablet by mouth every 6 (six) hours as needed for moderate pain., 60 tablet 0   methocarbamol (ROBAXIN) 500 MG tablet Take 500 mg by mouth 4 (four) times daily.     Multiple Vitamin (MULTIVITAMIN WITH MINERALS) TABS tablet Take 1 tablet by mouth daily.     tamsulosin (FLOMAX) 0.4 MG CAPS capsule Take 1 capsule (0.4 mg total) by mouth daily after supper. 90 capsule 3   No current facility-administered medications for this visit.     Objective: Vital signs in last 24 hours: BP 109/72   Pulse 66   Temp 98.1 F (36.7 C)   Wt 212 lb 6.4 oz (96.3 kg)   BMI 28.02 kg/m   Intake/Output from previous day: No intake/output data recorded. Intake/Output this shift: '@IOTHISSHIFT'$ @   Physical Exam   Studies/Results:    Assessment/Plan: Prostate cancer:  T1c Nx Mx Gleason 7(3+4) prostate cancer.  He is doing well s/p seeds with a good initial decline in the PSA.  He will return in 6 months with a PSA for an exam.      BPH with BOO.  He is voiding ok on tamsulosin   No orders of the defined types were placed in this encounter.    Orders Placed This Encounter  Procedures   Urinalysis, Routine w reflex microscopic   PSA    Standing Status:   Future    Standing Expiration Date:   04/12/2022     Return in about 6 months (around 10/13/2021) for with PSA.    CC: Dr. Elvia Collum    Irine Seal 04/12/2021 [phone]

## 2021-04-12 NOTE — Progress Notes (Signed)
Urological Symptom Review  Patient is experiencing the following symptoms: Frequent urination Get up at night to urinate   Review of Systems  Gastrointestinal (upper)  : Negative for upper GI symptoms  Gastrointestinal (lower) : Negative for lower GI symptoms  Constitutional : Negative for symptoms  Skin: Negative for skin symptoms  Eyes: Negative for eye symptoms  Ear/Nose/Throat : Negative for Ear/Nose/Throat symptoms  Hematologic/Lymphatic: Negative for Hematologic/Lymphatic symptoms  Cardiovascular : Negative for cardiovascular symptoms  Respiratory : Negative for respiratory symptoms  Endocrine: Negative for endocrine symptoms  Musculoskeletal: Back pain  Neurological: Negative for neurological symptoms  Psychologic: Negative for psychiatric symptoms

## 2021-04-21 IMAGING — DX DG KNEE COMPLETE 4+V*L*
4 series · 4 of 4 positions shown · non-contrast
Comparison: No prior.

CLINICAL DATA: Knee pain and swelling.  No injury.

EXAM:
LEFT KNEE - COMPLETE 4+ VIEW

[knee ap]
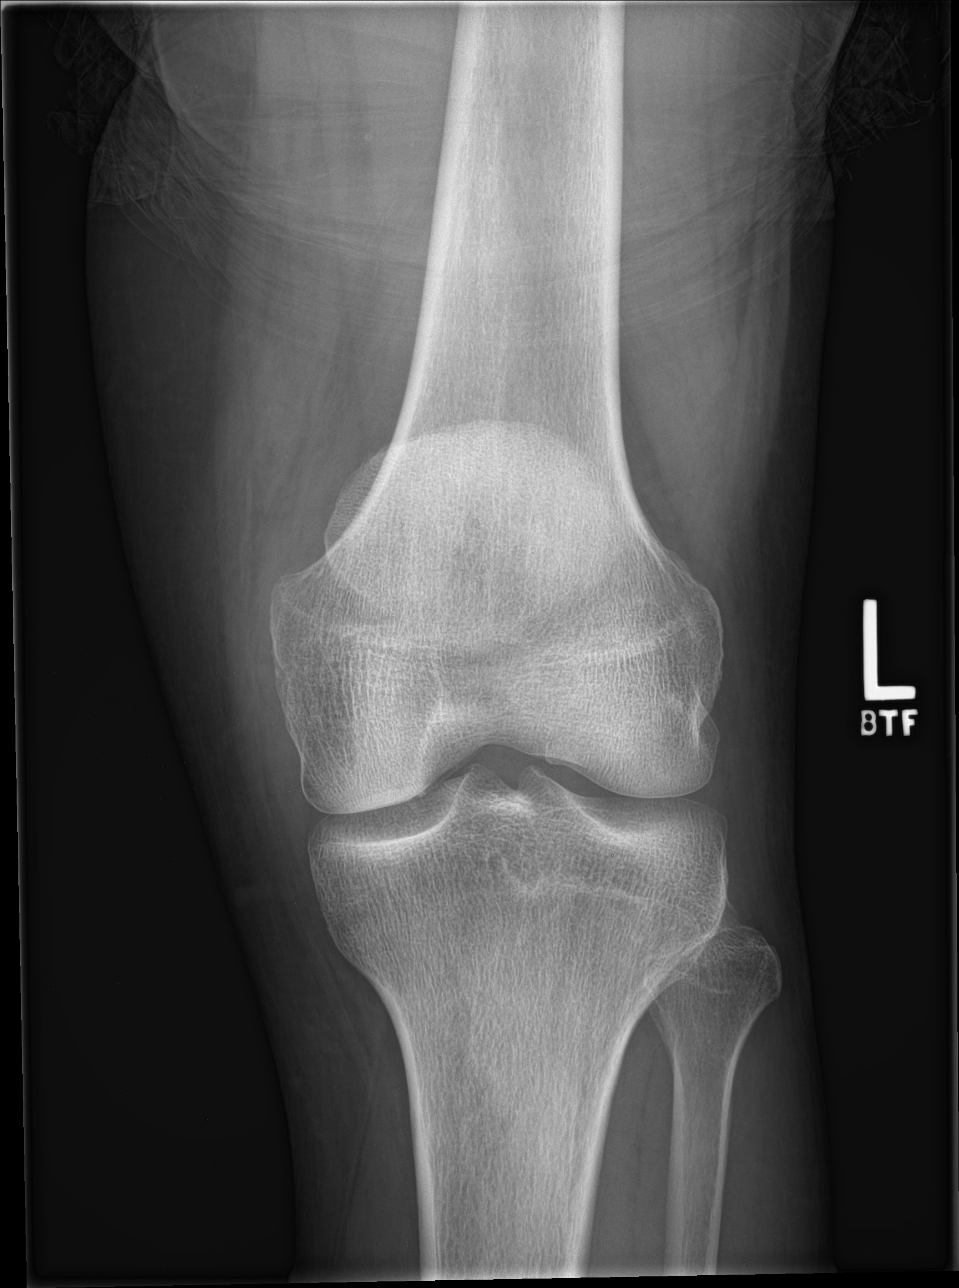

[knee obl (1 of 2)]
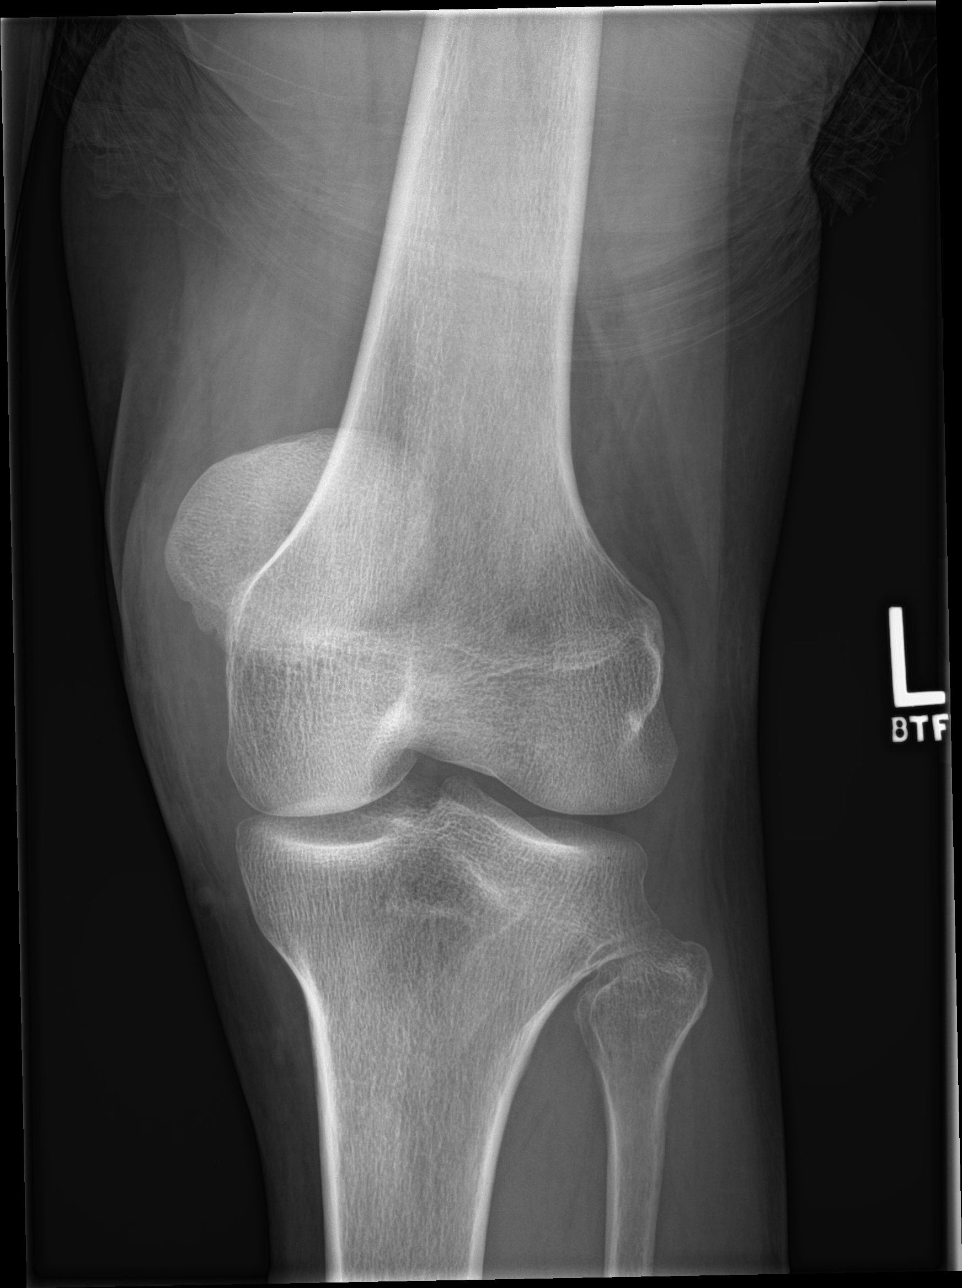

[knee obl (2 of 2)]
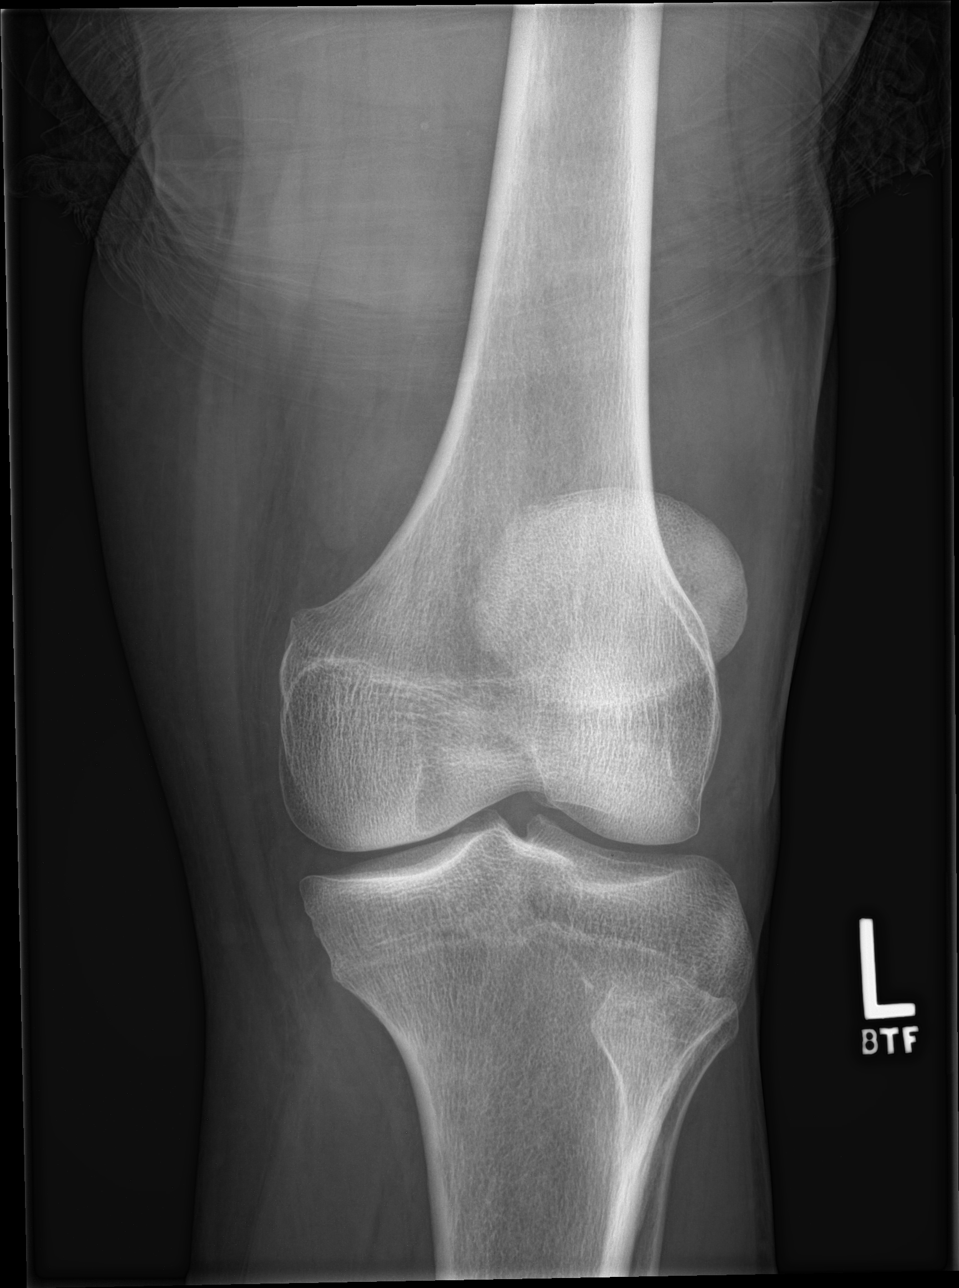

[knee lat]
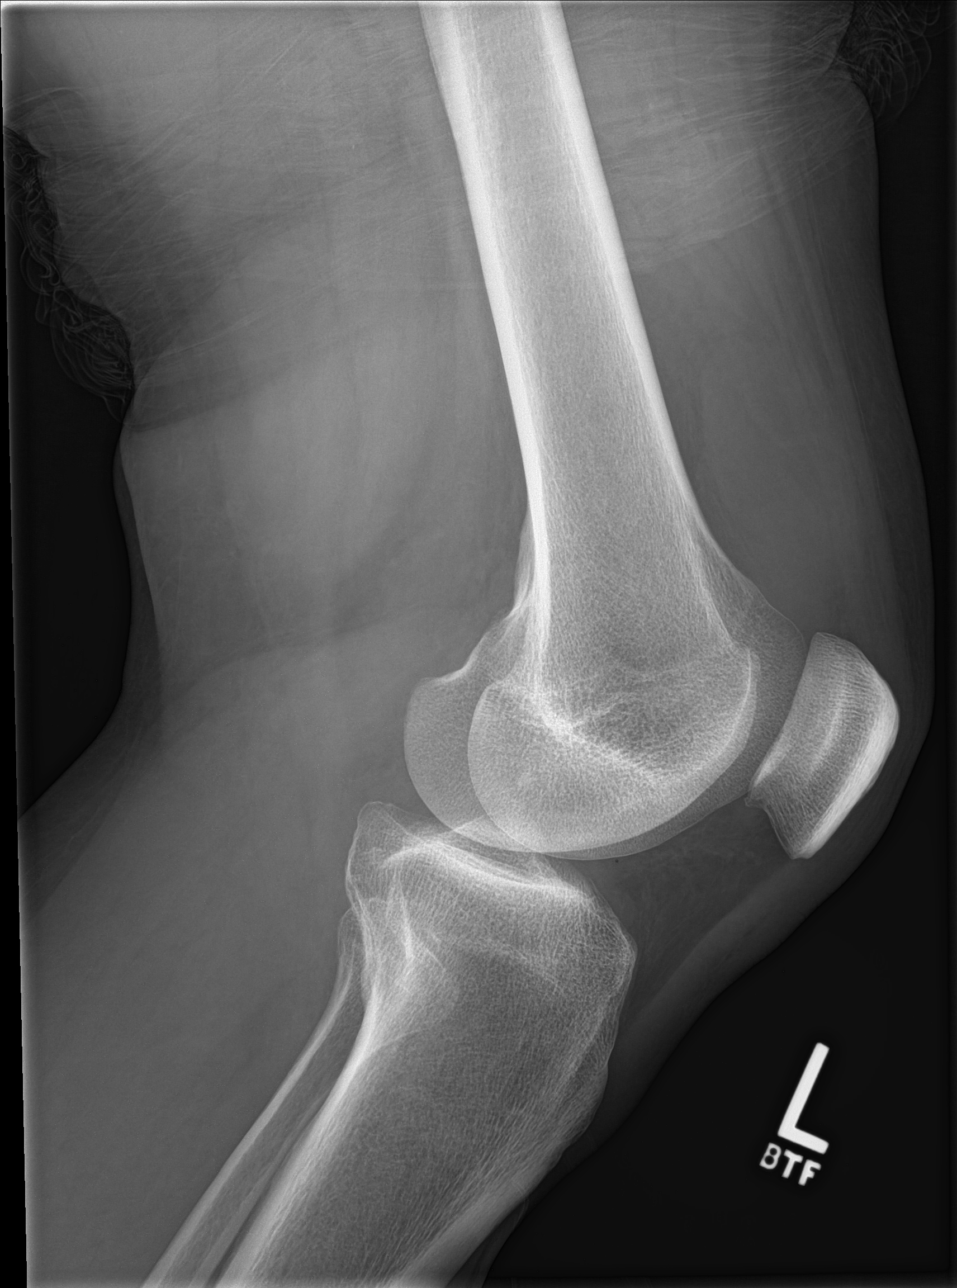

[4 of 4 positions shown; findings below may reference images not displayed]

FINDINGS: No acute bony abnormality identified. No evidence of fracture
dislocation. Prominent knee joint effusion noted.
IMPRESSION: Prominent knee joint effusion. No acute bony abnormality identified.

## 2021-04-30 ENCOUNTER — Other Ambulatory Visit: Payer: Self-pay | Admitting: Family Medicine

## 2021-04-30 DIAGNOSIS — G8929 Other chronic pain: Secondary | ICD-10-CM

## 2021-05-15 ENCOUNTER — Other Ambulatory Visit: Payer: Self-pay

## 2021-05-15 ENCOUNTER — Telehealth: Payer: Self-pay

## 2021-05-15 ENCOUNTER — Ambulatory Visit (INDEPENDENT_AMBULATORY_CARE_PROVIDER_SITE_OTHER): Payer: PRIVATE HEALTH INSURANCE | Admitting: Family Medicine

## 2021-05-15 VITALS — Ht 73.0 in | Wt 212.0 lb

## 2021-05-15 DIAGNOSIS — M5136 Other intervertebral disc degeneration, lumbar region: Secondary | ICD-10-CM | POA: Diagnosis not present

## 2021-05-15 DIAGNOSIS — C61 Malignant neoplasm of prostate: Secondary | ICD-10-CM

## 2021-05-15 DIAGNOSIS — M5441 Lumbago with sciatica, right side: Secondary | ICD-10-CM | POA: Diagnosis not present

## 2021-05-15 DIAGNOSIS — G8929 Other chronic pain: Secondary | ICD-10-CM | POA: Diagnosis not present

## 2021-05-15 MED ORDER — NABUMETONE 500 MG PO TABS: ORAL_TABLET | ORAL | 3 refills | Status: AC

## 2021-05-15 NOTE — Progress Notes (Addendum)
I connected with  Cobin Hirtz on 05/15/21 by a phone enabled telemedicine application and verified that I am speaking with the correct person using two identifiers.   I discussed the limitations of evaluation and management by telemedicine. The patient expressed understanding and agreed to proceed.  Patient location: home  Provider location: in office  I provided 12 minutes of non face - to - face time during this encounter.   Patient ID: Ricardo Pacheco, male    DOB: 28-Jun-1956, 65 y.o.   MRN: [id number]   Chief Complaint  Patient presents with   chronic low back pain     Discuss Relafen prescription pt has been taking it 17 yrs, no longer on med list , not certain who D/C'd it    Subjective:    HPI Chronic low back pain- Pt had procedure done for radioactive seeds in 3/22 for treatment for prostate cancer.   H/o back pain- going on since 2006.  Dx as degenerative back disease.  Not having any new issues. Getting back injections every year, was seeing specialist in [place]. Pt was discontinued on relafen, due to concern of it affecting the "kidneys." Using Methocarbamol- use for spasm. 1/22- was changed from relafen due to taking dicolfenac after having knee surgery. Pt stating not taking diclofenac anymore and feeling like he needs an anti-inflammatory for his back.   Would like to go back on nabumetone. Not having numbness or tingling down leg, no saddle anesthesia.  No bowel or bladder incontinence.  No hematuria.  MRI- 2018- progressive disc and endplate deg at 075-GRM, moderate foraminal narrowing bilaterally, worse on left.  Deg disc disease L5-S1, mild foraminal narrowing bilaterally.  Medical History Rock has a past medical history of Arthritis, Decreased white blood cell count (2018), Hypercholesteremia, Prostate cancer ([place]), Prostate cancer ([place]), Wears glasses, and Wears partial dentures.   Outpatient Encounter Medications as of 05/15/2021  Medication Sig    HYDROcodone-acetaminophen (NORCO) 10-325 MG tablet Take 1 tablet by mouth every 6 (six) hours as needed for moderate pain.,   methocarbamol (ROBAXIN) 500 MG tablet Take 500 mg by mouth 4 (four) times daily.   Multiple Vitamin (MULTIVITAMIN WITH MINERALS) TABS tablet Take 1 tablet by mouth daily.   tamsulosin (FLOMAX) 0.4 MG CAPS capsule Take 1 capsule (0.4 mg total) by mouth daily after supper.   nabumetone (RELAFEN) 500 MG tablet TAKE ONE TABLET TWICE A DAY WITH FOOD AS NEEDED.   [DISCONTINUED] diclofenac (VOLTAREN) 75 MG EC tablet Take 75 mg by mouth 2 (two) times daily. (Patient not taking: Reported on 04/12/2021)   No facility-administered encounter medications on file as of 05/15/2021.     Review of Systems  Constitutional:  Negative for chills and fever.  HENT:  Negative for congestion, rhinorrhea and sore throat.   Respiratory:  Negative for cough, shortness of breath and wheezing.   Cardiovascular:  Negative for chest pain and leg swelling.  Gastrointestinal:  Negative for abdominal pain, diarrhea, nausea and vomiting.  Genitourinary:  Negative for dysuria, frequency and hematuria.  Musculoskeletal:  Positive for back pain (chronic).  Skin:  Negative for rash.  Neurological:  Negative for dizziness, weakness, numbness and headaches.    Vitals Ht '6\' 1"'$  (1.854 m)   Wt 212 lb (96.2 kg)   BMI 27.97 kg/m   Objective:   Physical Exam No PE due to phone visit.   Assessment and Plan   1. Degeneration of lumbar intervertebral disc  2. Chronic right-sided low back pain with right-sided  sciatica  3. Malignant neoplasm of prostate ([facility])   Chronic lumbar back pain- Sent in refill for nabumetone.  Not taking diclofenac at this time. Recheck bmp to check kidney function, to make sure it's normal. If back pain continuing would recommend repeat imaging.  Since recent treatment with prostate cancer.   Return in about 3 months (around 08/15/2021) for f/u back pain, repeat labs.

## 2021-05-15 NOTE — Telephone Encounter (Signed)
I connected with  Ricardo Pacheco on 05/15/21 by a video enabled telemedicine application and verified that I am speaking with the correct person using two identifiers.   I discussed the limitations of evaluation and management by telemedicine. The patient expressed understanding and agreed to proceed.

## 2021-07-17 DIAGNOSIS — M5416 Radiculopathy, lumbar region: Secondary | ICD-10-CM | POA: Diagnosis not present

## 2021-07-31 DIAGNOSIS — M5459 Other low back pain: Secondary | ICD-10-CM | POA: Diagnosis not present

## 2021-10-04 ENCOUNTER — Other Ambulatory Visit: Payer: Medicare Other

## 2021-10-04 ENCOUNTER — Other Ambulatory Visit: Payer: Self-pay

## 2021-10-04 DIAGNOSIS — C61 Malignant neoplasm of prostate: Secondary | ICD-10-CM

## 2021-10-05 LAB — PSA: Prostate Specific Ag, Serum: 0.8 ng/mL (ref 0.0–4.0)

## 2021-10-11 ENCOUNTER — Encounter: Payer: Self-pay | Admitting: Urology

## 2021-10-11 ENCOUNTER — Ambulatory Visit (INDEPENDENT_AMBULATORY_CARE_PROVIDER_SITE_OTHER): Payer: Medicare Other | Admitting: Urology

## 2021-10-11 ENCOUNTER — Other Ambulatory Visit: Payer: Self-pay

## 2021-10-11 VITALS — BP 126/77 | HR 65 | Wt 215.0 lb

## 2021-10-11 DIAGNOSIS — R351 Nocturia: Secondary | ICD-10-CM | POA: Diagnosis not present

## 2021-10-11 DIAGNOSIS — N401 Enlarged prostate with lower urinary tract symptoms: Secondary | ICD-10-CM

## 2021-10-11 DIAGNOSIS — R3915 Urgency of urination: Secondary | ICD-10-CM

## 2021-10-11 DIAGNOSIS — N138 Other obstructive and reflux uropathy: Secondary | ICD-10-CM | POA: Diagnosis not present

## 2021-10-11 DIAGNOSIS — C61 Malignant neoplasm of prostate: Secondary | ICD-10-CM

## 2021-10-11 LAB — URINALYSIS, ROUTINE W REFLEX MICROSCOPIC
Bilirubin, UA: NEGATIVE
Glucose, UA: NEGATIVE
Ketones, UA: NEGATIVE
Leukocytes,UA: NEGATIVE
Nitrite, UA: NEGATIVE
Protein,UA: NEGATIVE
RBC, UA: NEGATIVE
Specific Gravity, UA: 1.015 (ref 1.005–1.030)
Urobilinogen, Ur: 0.2 mg/dL (ref 0.2–1.0)
pH, UA: 6 (ref 5.0–7.5)

## 2021-10-11 MED ORDER — TAMSULOSIN HCL 0.4 MG PO CAPS: 0.4000 mg | ORAL_CAPSULE | Freq: Every day | ORAL | 3 refills | Status: DC

## 2021-10-11 NOTE — Progress Notes (Signed)
Subjective: 1. Prostate cancer ([place])   2. BPH with urinary obstruction   3. Urgency of urination   4. Nocturia      Mr. Motta returns in f/u for his history of prostate cancer treated with a seed implant on 11/24/20.  He has.  He has T1c Nx Mx prostate cancer with 2 cores of Gleason 7(3+4) disease with >50% involvement with one on each side  and 8 cores of Gleason 6 disease with up to 80% involvement with 4 on each side.   He is doing well post op and has no significant associated signs or symptoms.  His PSA is down to 0.8 from 1.6 on 04/05/21 and 4.0 in 10/21. He has no weight loss or bone pain.    He has had well preserved erectile function .   He has BPH with BOO and is on tamsulosin with moderate LUTS.  His IPSS is 9 with nocturia x 3. He feels he has improved.   He has had no hematuria.  UA is clear.     Gu hx: Mr. Vaile is a 66 yo AAM who is sent by Elvia Collum DO for an elevated PSA of 4.6 on 05/24/20 which is up from 2.5 in 10/20 and 2.4 in 9/19.  He has moderate LUTS with some urgency and nocturia that is worse with fluids.  His IPSS is 12.  He has been on tamsulosin for several years.  He is on chronic hydrocodone for chronic back pain but he only takes it intermittently when the epidurals wear off.  He has no history of UTI's, stone or GU surgery.  He has two brother with prostate cancer in their upper 52's.      IPSS     Row Name 10/11/21 1500         International Prostate Symptom Score   How often have you had the sensation of not emptying your bladder? Less than 1 in 5     How often have you had to urinate less than every two hours? Less than half the time     How often have you found you stopped and started again several times when you urinated? Not at All     How often have you found it difficult to postpone urination? About half the time     How often have you had a weak urinary stream? Not at All     How often have you had to strain to start urination? Not at All      How many times did you typically get up at night to urinate? 3 Times     Total IPSS Score 9       Quality of Life due to urinary symptoms   If you were to spend the rest of your life with your urinary condition just the way it is now how would you feel about that? Mostly Satisfied              ROS:  ROS  No Known Allergies  Past Medical History:  Diagnosis Date   Arthritis    djd back back brace prn   Decreased white blood cell count 2018   resolved    Hypercholesteremia    no meds taken   Prostate [facility]    Prostate cancer ([place])    Wears glasses    Wears partial dentures    lower    Past Surgical History:  Procedure Laterality Date   COLONOSCOPY N/A 07/18/2017  Procedure: COLONOSCOPY;  Surgeon: Danie Binder, MD;  [facility];  Service: Endoscopy;  Laterality: N/A;  2:15 pm   MENISCUS REPAIR Left [facility]/2020   [facility]   POLYPECTOMY  07/18/2017   Procedure: POLYPECTOMY;  Surgeon: Danie Binder, MD;  [facility];  Service: Endoscopy;;  hepatic flexure   PROSTATE BIOPSY     in office   RADIOACTIVE SEED IMPLANT N/A 11/24/2020   Procedure: RADIOACTIVE SEED IMPLANT/BRACHYTHERAPY IMPLANT;  Surgeon: Irine Seal, MD;  [facility];  Service: Urology;  Laterality: N/A;   SPACE OAR INSTILLATION N/A 11/24/2020   Procedure: SPACE OAR INSTILLATION;  Surgeon: Irine Seal, MD;  Location: [facility];  Service: Urology;  Laterality: N/A;    Social History   Socioeconomic History   Marital status: Married    Spouse name: Not on file   Number of children: 2   Years of education: Not on file   Highest education level: Not on file  Occupational History   Occupation: [employer]    Comment: full time; plans to retire again in May  Tobacco Use   Smoking status: Former    Packs/day: 1.00    Years: 28.00    Pack years: 28.00    Types: Cigarettes    Quit date: 09/16/2000    Years since  quitting: 21.0   Smokeless tobacco: Never  Vaping Use   Vaping Use: Never used  Substance and Sexual Activity   Alcohol use: No   Drug use: No   Sexual activity: Yes  Other Topics Concern   Not on file  Social History Narrative   Not on file   Social Determinants of Health   Financial Resource Strain: Not on file  Food Insecurity: Not on file  Transportation Needs: Not on file  Physical Activity: Not on file  Stress: Not on file  Social Connections: Not on file  Intimate Partner Violence: Not on file    Family History  Problem Relation Age of Onset   Hypertension Mother    Stroke Father    Prostate cancer Brother    Throat cancer Brother    Prostate cancer Brother    Colon cancer Neg Hx    Colon polyps Neg Hx    Breast cancer Neg Hx    Pancreatic cancer Neg Hx     Anti-infectives: Anti-infectives (From admission, onward)    None       Current Outpatient Medications  Medication Sig Dispense Refill   HYDROcodone-acetaminophen (NORCO) 10-325 MG tablet Take 1 tablet by mouth every 6 (six) hours as needed for moderate pain., 60 tablet 0   methocarbamol (ROBAXIN) 500 MG tablet Take 500 mg by mouth 4 (four) times daily.     Multiple Vitamin (MULTIVITAMIN WITH MINERALS) TABS tablet Take 1 tablet by mouth daily.     nabumetone (RELAFEN) 500 MG tablet TAKE ONE TABLET TWICE A DAY WITH FOOD AS NEEDED. 42 tablet 3   tamsulosin (FLOMAX) 0.4 MG CAPS capsule Take 1 capsule (0.4 mg total) by mouth daily after supper. 90 capsule 3   No current facility-administered medications for this visit.     Objective: Vital signs in last 24 hours: BP 126/77    Pulse 65    Wt 215 lb (97.5 kg)    BMI 28.37 kg/m   Intake/Output from previous day: No intake/output data recorded. Intake/Output this shift: @IOTHISSHIFT @   Physical Exam   Studies/Results:  Lab Results  Component Value Date   PSA1 0.8 10/04/2021   PSA1 1.6 04/05/2021   PSA1 4.0 06/21/2020   Results for  orders placed or performed in visit on 10/11/21 (from the past 24 hour(s))  Urinalysis, Routine w reflex microscopic     Status: None   Collection Time: 10/11/21  3:46 PM  Result Value Ref Range   Specific Gravity, UA 1.015 1.005 - 1.030   pH, UA 6.0 5.0 - 7.5   Color, UA Yellow Yellow   Appearance Ur Clear Clear   Leukocytes,UA Negative Negative   Protein,UA Negative Negative/Trace   Glucose, UA Negative Negative   Ketones, UA Negative Negative   RBC, UA Negative Negative   Bilirubin, UA Negative Negative   Urobilinogen, Ur 0.2 0.2 - 1.0 mg/dL   Nitrite, UA Negative Negative   Microscopic Examination Comment    Narrative   Performed at:  [address] [place] [address] [place], [place]  [id number] Lab Director: Mina Marble MT, Phone:  [phone]     Assessment/Plan: Prostate cancer:  T1c Nx Mx Gleason 7(3+4) prostate cancer.  He is doing well s/p seeds with a further decline in the PSA.  He will return in 6 months with a PSA for an exam.      BPH with BOO.  He is voiding ok on tamsulosin   Meds ordered this encounter  Medications   tamsulosin (FLOMAX) 0.4 MG CAPS capsule    Sig: Take 1 capsule (0.4 mg total) by mouth daily after supper.    Dispense:  90 capsule    Refill:  3      Orders Placed This Encounter  Procedures   Urinalysis, Routine w reflex microscopic   PSA    Standing Status:   Future    Standing Expiration Date:   10/11/2022     Return in about 6 months (around 04/10/2022) for with PSA.    CC: Dr. Elvia Collum    Irine Seal 10/11/2021 [phone] ID: Geronimo Running, male   DOB: 09/01/1956, 66 y.o.   MRN: [id number]

## 2021-10-11 NOTE — Progress Notes (Signed)
Urological Symptom Review  Patient is experiencing the following symptoms: Hard to postpone urination   Review of Systems  Gastrointestinal (upper)  : Negative for upper GI symptoms  Gastrointestinal (lower) : Negative for lower GI symptoms  Constitutional : Negative for symptoms  Skin: Negative for skin symptoms  Eyes: Negative for eye symptoms  Ear/Nose/Throat : Negative for Ear/Nose/Throat symptoms  Hematologic/Lymphatic: Negative for Hematologic/Lymphatic symptoms  Cardiovascular : Negative for cardiovascular symptoms  Respiratory : Negative for respiratory symptoms  Endocrine: Negative for endocrine symptoms  Musculoskeletal: Back pain  Neurological: Negative for neurological symptoms  Psychologic: Negative for psychiatric symptoms

## 2021-12-04 DIAGNOSIS — M5416 Radiculopathy, lumbar region: Secondary | ICD-10-CM | POA: Diagnosis not present

## 2022-01-05 IMAGING — DX DG CHEST 2V
2 series · 2 of 2 positions shown · non-contrast
Comparison: None.

CLINICAL DATA: Preop evaluation for upcoming prostate seed
implantation

EXAM:
CHEST - 2 VIEW

[chest pa]
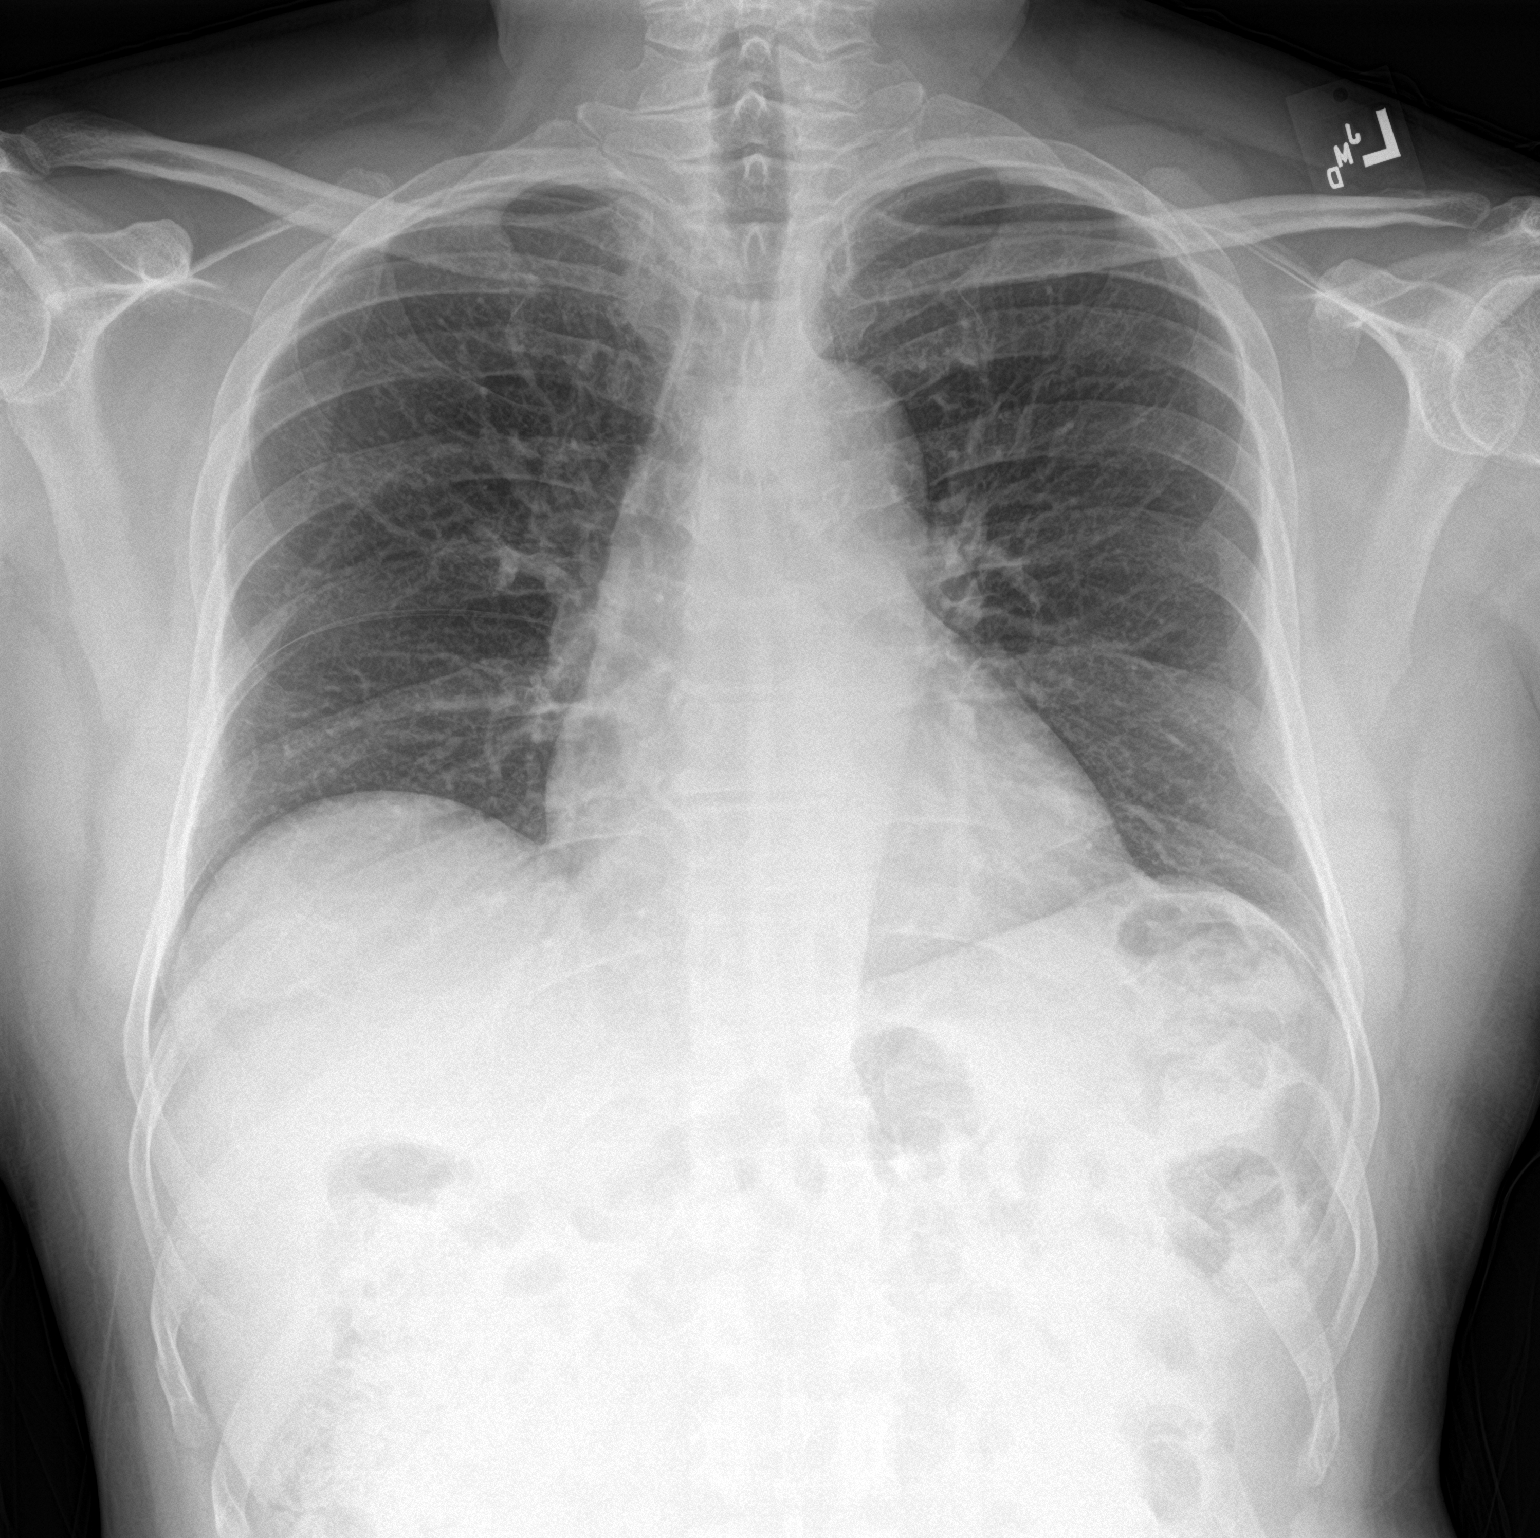

[chest lat]
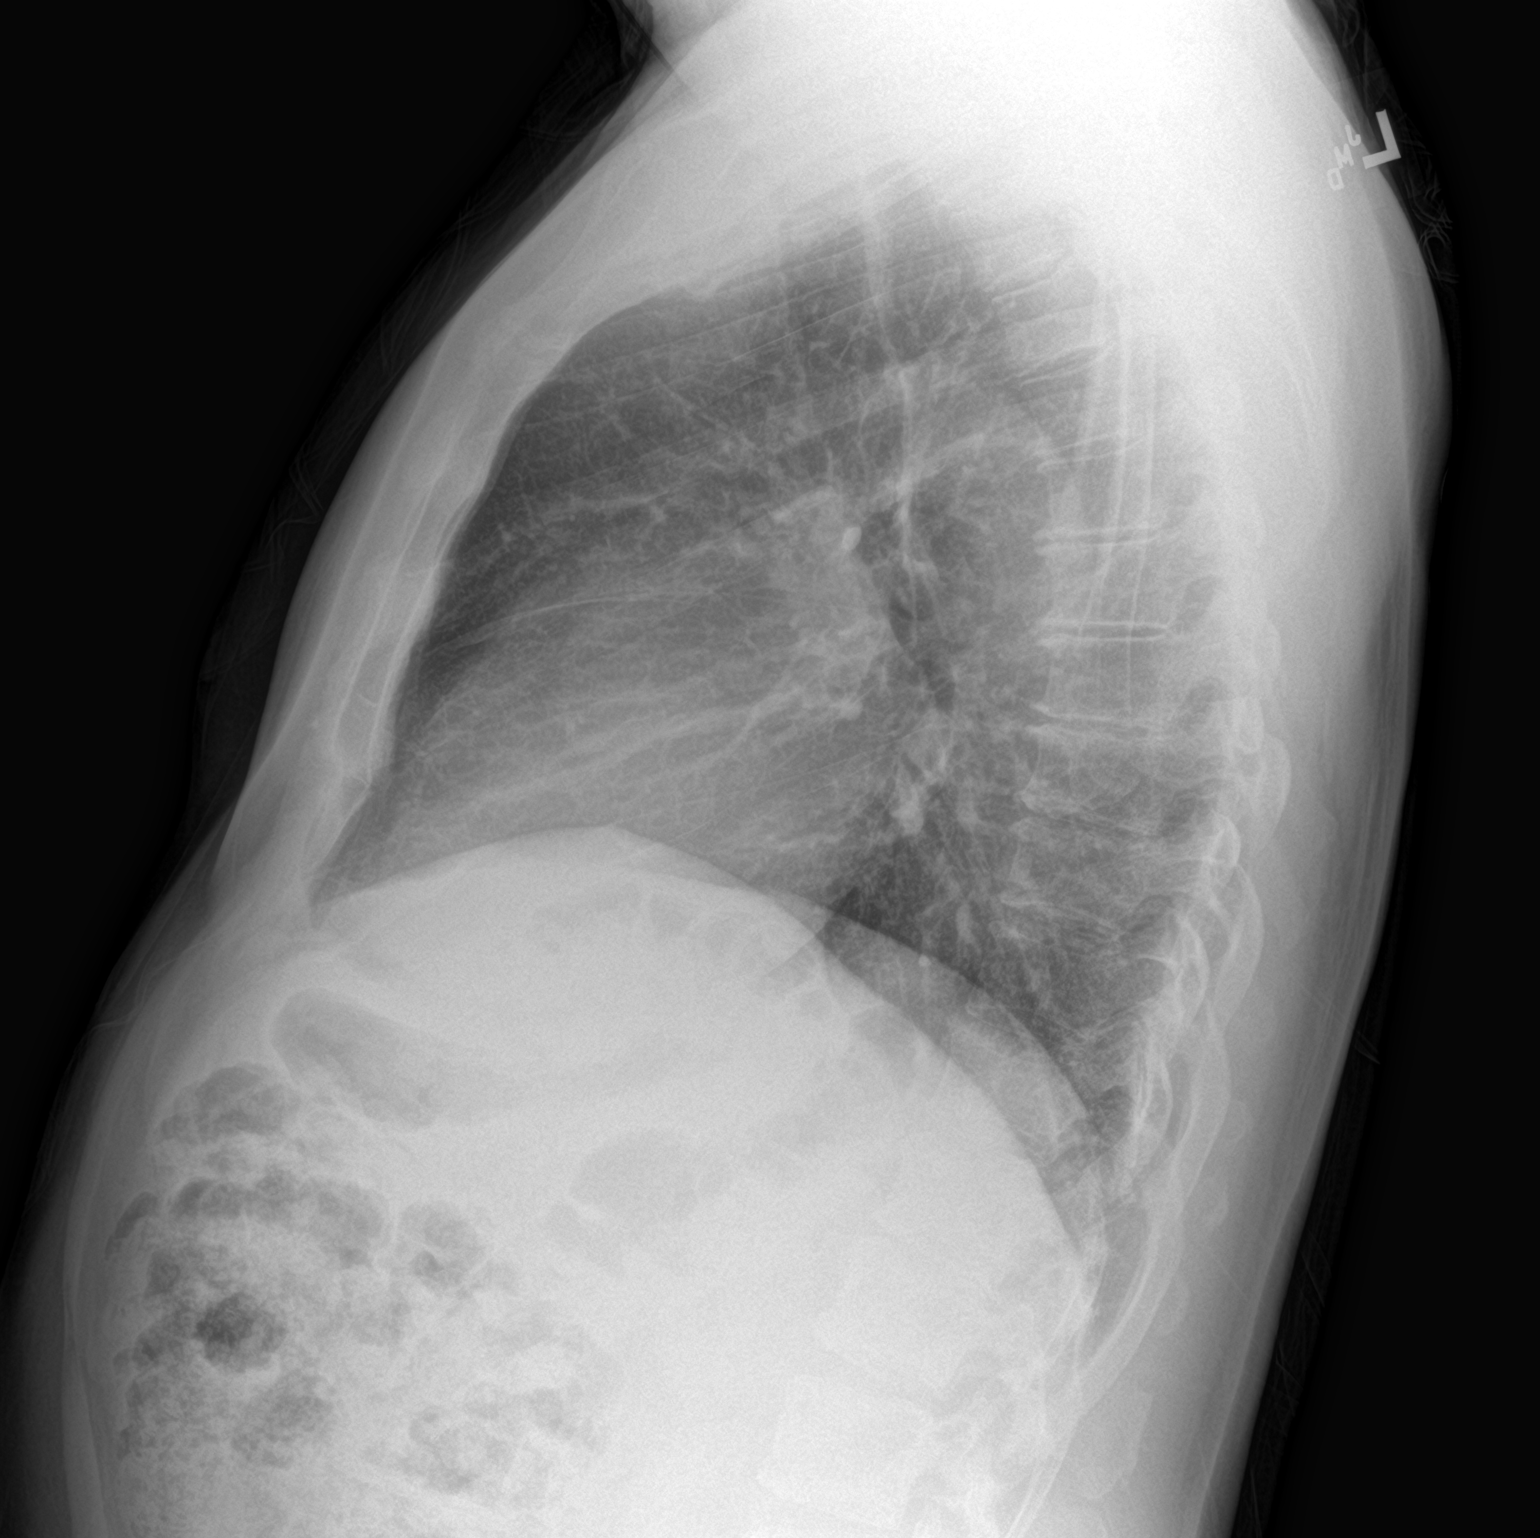

[2 of 2 positions shown; findings below may reference images not displayed]

FINDINGS: The heart size and mediastinal contours are within normal limits.
Both lungs are clear. The visualized skeletal structures are
unremarkable.
IMPRESSION: No active cardiopulmonary disease.

## 2022-02-25 DIAGNOSIS — Z5181 Encounter for therapeutic drug level monitoring: Secondary | ICD-10-CM | POA: Diagnosis not present

## 2022-02-25 DIAGNOSIS — Z79899 Other long term (current) drug therapy: Secondary | ICD-10-CM | POA: Diagnosis not present

## 2022-02-25 DIAGNOSIS — M5416 Radiculopathy, lumbar region: Secondary | ICD-10-CM | POA: Diagnosis not present

## 2022-02-25 DIAGNOSIS — M5136 Other intervertebral disc degeneration, lumbar region: Secondary | ICD-10-CM | POA: Diagnosis not present

## 2022-04-04 ENCOUNTER — Other Ambulatory Visit: Payer: No Typology Code available for payment source

## 2022-04-04 DIAGNOSIS — C61 Malignant neoplasm of prostate: Secondary | ICD-10-CM

## 2022-04-05 LAB — PSA: Prostate Specific Ag, Serum: 0.6 ng/mL (ref 0.0–4.0)

## 2022-04-11 ENCOUNTER — Encounter: Payer: Self-pay | Admitting: Urology

## 2022-04-11 ENCOUNTER — Ambulatory Visit: Payer: Medicare Other | Admitting: Urology

## 2022-04-11 VITALS — BP 128/77 | HR 70

## 2022-04-11 DIAGNOSIS — N138 Other obstructive and reflux uropathy: Secondary | ICD-10-CM | POA: Diagnosis not present

## 2022-04-11 DIAGNOSIS — N401 Enlarged prostate with lower urinary tract symptoms: Secondary | ICD-10-CM | POA: Diagnosis not present

## 2022-04-11 DIAGNOSIS — C61 Malignant neoplasm of prostate: Secondary | ICD-10-CM

## 2022-04-11 DIAGNOSIS — R351 Nocturia: Secondary | ICD-10-CM

## 2022-04-11 NOTE — Progress Notes (Signed)
Subjective: 1. Prostate cancer ([place])   2. BPH with urinary obstruction   3. Nocturia      Mr. Ricardo Pacheco returns in f/u for his history of prostate cancer treated with a seed implant on 11/24/20.  He has.  He has T1c Nx Mx prostate cancer with 2 cores of Gleason 7(3+4) disease with >50% involvement with one on each side  and 8 cores of Gleason 6 disease with up to 80% involvement with 4 on each side.   He is doing well post op and has no significant associated signs or symptoms.  His PSA is down to 0.6 from 0.8 in 1/23, 1.6 on 04/05/21 and 4.0 in 10/21. He has no weight loss or bone pain.    He has had well preserved erectile function .   He has BPH with BOO and is on tamsulosin with moderate LUTS.  His IPSS is 8  with nocturia x 3.  He has had no hematuria.  UA is clear.     Gu hx: Mr. Ricardo Pacheco is a 66 yo AAM who is sent by Elvia Collum DO for an elevated PSA of 4.6 on 05/24/20 which is up from 2.5 in 10/20 and 2.4 in 9/19.  He has moderate LUTS with some urgency and nocturia that is worse with fluids.  His IPSS is 12.  He has been on tamsulosin for several years.  He is on chronic hydrocodone for chronic back pain but he only takes it intermittently when the epidurals wear off.  He has no history of UTI's, stone or GU surgery.  He has two brother with prostate cancer in their upper 49's.      IPSS     Row Name 04/11/22 1400         International Prostate Symptom Score   How often have you had the sensation of not emptying your bladder? Not at All     How often have you had to urinate less than every two hours? About half the time     How often have you found you stopped and started again several times when you urinated? Not at All     How often have you found it difficult to postpone urination? Less than half the time     How often have you had a weak urinary stream? Not at All     How often have you had to strain to start urination? Not at All     How many times did you typically get up at  night to urinate? 3 Times     Total IPSS Score 8       Quality of Life due to urinary symptoms   If you were to spend the rest of your life with your urinary condition just the way it is now how would you feel about that? Pleased              ROS:  ROS  No Known Allergies  Past Medical History:  Diagnosis Date   Arthritis    djd back back brace prn   Decreased white blood cell count 2018   resolved    Hypercholesteremia    no meds taken   Prostate cancer ([place])    Prostate cancer ([place])    Wears glasses    Wears partial dentures    lower    Past Surgical History:  Procedure Laterality Date   COLONOSCOPY N/A 07/18/2017   Procedure: COLONOSCOPY;  Surgeon: Danie Binder, MD;  [facility];  Service: Endoscopy;  Laterality: N/A;  2:15 pm   MENISCUS REPAIR Left 03/2020   [facility]   POLYPECTOMY  07/18/2017   Procedure: POLYPECTOMY;  Surgeon: Danie Binder, MD;  [facility];  Service: Endoscopy;;  hepatic flexure   PROSTATE BIOPSY     in office   RADIOACTIVE SEED IMPLANT N/A 11/24/2020   Procedure: RADIOACTIVE SEED IMPLANT/BRACHYTHERAPY IMPLANT;  Surgeon: Irine Seal, MD;  Location: [facility];  Service: Urology;  Laterality: N/A;   SPACE OAR INSTILLATION N/A 11/24/2020   Procedure: SPACE OAR INSTILLATION;  Surgeon: Irine Seal, MD;  Location: [facility];  Service: Urology;  Laterality: N/A;    Social History   Socioeconomic History   Marital status: Married    Spouse name: Not on file   Number of children: 2   Years of education: Not on file   Highest education level: Not on file  Occupational History   Occupation: [employer]    Comment: full time; plans to retire again in May  Tobacco Use   Smoking status: Former    Packs/day: 1.00    Years: 28.00    Total pack years: 28.00    Types: Cigarettes    Quit date: 09/16/2000    Years since quitting: 21.5   Smokeless tobacco: Never  Vaping  Use   Vaping Use: Never used  Substance and Sexual Activity   Alcohol use: No   Drug use: No   Sexual activity: Yes  Other Topics Concern   Not on file  Social History Narrative   Not on file   Social Determinants of Health   Financial Resource Strain: Not on file  Food Insecurity: Not on file  Transportation Needs: Not on file  Physical Activity: Not on file  Stress: Not on file  Social Connections: Not on file  Intimate Partner Violence: Not on file    Family History  Problem Relation Age of Onset   Hypertension Mother    Stroke Father    Prostate cancer Brother    Throat cancer Brother    Prostate cancer Brother    Colon cancer Neg Hx    Colon polyps Neg Hx    Breast cancer Neg Hx    Pancreatic cancer Neg Hx     Anti-infectives: Anti-infectives (From admission, onward)    None       Current Outpatient Medications  Medication Sig Dispense Refill   HYDROcodone-acetaminophen (NORCO) 10-325 MG tablet Take 1 tablet by mouth every 6 (six) hours as needed for moderate pain., 60 tablet 0   methocarbamol (ROBAXIN) 500 MG tablet Take 500 mg by mouth 4 (four) times daily.     Multiple Vitamin (MULTIVITAMIN WITH MINERALS) TABS tablet Take 1 tablet by mouth daily.     nabumetone (RELAFEN) 500 MG tablet TAKE ONE TABLET TWICE A DAY WITH FOOD AS NEEDED. 42 tablet 3   tamsulosin (FLOMAX) 0.4 MG CAPS capsule Take 1 capsule (0.4 mg total) by mouth daily after supper. 90 capsule 3   No current facility-administered medications for this visit.     Objective: Vital signs in last 24 hours: BP 128/77   Pulse 70   Intake/Output from previous day: No intake/output data recorded. Intake/Output this shift: '@IOTHISSHIFT'$ @   Physical Exam   Studies/Results:  Lab Results  Component Value Date   PSA1 0.6 04/04/2022   PSA1 0.8 10/04/2021   PSA1 1.6 04/05/2021   Results for orders placed  or performed in visit on 04/11/22 (from the past 24 hour(s))  Urinalysis, Routine w  reflex microscopic     Status: None   Collection Time: 04/11/22  1:41 PM  Result Value Ref Range   Specific Gravity, UA 1.015 1.005 - 1.030   pH, UA 7.0 5.0 - 7.5   Color, UA Yellow Yellow   Appearance Ur Clear Clear   Leukocytes,UA Negative Negative   Protein,UA Negative Negative/Trace   Glucose, UA Negative Negative   Ketones, UA Negative Negative   RBC, UA Negative Negative   Bilirubin, UA Negative Negative   Urobilinogen, Ur 0.2 0.2 - 1.0 mg/dL   Nitrite, UA Negative Negative   Microscopic Examination Comment    Narrative   Performed at:  [facility] [address] [place], [place]  [id number] Lab Director: Mina Marble MT, Phone:  [phone]    UA is clear  Assessment/Plan: Prostate cancer:  T1c Nx Mx Gleason 7(3+4) prostate cancer.  He is doing well s/p seeds with a further decline in the PSA.  He will return in 6 months with a PSA for an exam.      BPH with BOO.  He is voiding ok on tamsulosin   No orders of the defined types were placed in this encounter.     Orders Placed This Encounter  Procedures   Urinalysis, Routine w reflex microscopic   PSA    Standing Status:   Future    Standing Expiration Date:   04/12/2023     Return in about 6 months (around 10/12/2022) for with PSA.    CC: Dr. Elvia Collum    Irine Seal 04/12/2022 [phone] ID: Geronimo Running, male   DOB: 1956/02/02, 66 y.o.   MRN: [id number]

## 2022-04-12 LAB — URINALYSIS, ROUTINE W REFLEX MICROSCOPIC
Bilirubin, UA: NEGATIVE
Glucose, UA: NEGATIVE
Ketones, UA: NEGATIVE
Leukocytes,UA: NEGATIVE
Nitrite, UA: NEGATIVE
Protein,UA: NEGATIVE
RBC, UA: NEGATIVE
Specific Gravity, UA: 1.015 (ref 1.005–1.030)
Urobilinogen, Ur: 0.2 mg/dL (ref 0.2–1.0)
pH, UA: 7 (ref 5.0–7.5)

## 2022-06-13 DIAGNOSIS — M5416 Radiculopathy, lumbar region: Secondary | ICD-10-CM | POA: Diagnosis not present

## 2022-07-01 DIAGNOSIS — G894 Chronic pain syndrome: Secondary | ICD-10-CM | POA: Diagnosis not present

## 2022-07-01 DIAGNOSIS — M5136 Other intervertebral disc degeneration, lumbar region: Secondary | ICD-10-CM | POA: Diagnosis not present

## 2022-07-01 DIAGNOSIS — M5416 Radiculopathy, lumbar region: Secondary | ICD-10-CM | POA: Diagnosis not present

## 2022-07-18 ENCOUNTER — Encounter: Payer: Self-pay | Admitting: *Deleted

## 2022-08-06 ENCOUNTER — Encounter: Payer: Self-pay | Admitting: Nurse Practitioner

## 2022-08-06 ENCOUNTER — Ambulatory Visit (INDEPENDENT_AMBULATORY_CARE_PROVIDER_SITE_OTHER): Payer: Medicare Other | Admitting: Nurse Practitioner

## 2022-08-06 VITALS — BP 124/70 | HR 87 | Temp 97.5°F | Ht 73.0 in | Wt 217.0 lb

## 2022-08-06 DIAGNOSIS — Z0000 Encounter for general adult medical examination without abnormal findings: Secondary | ICD-10-CM

## 2022-08-06 NOTE — Patient Instructions (Signed)
Thank you for coming for your annual wellness visit.  Please follow through on any advice that was given to you by today's visit. Remember to maintain compliance with your medications as discussed today.  Also remember it is important to eat a healthy diet and to stay physically active on a daily basis.  Please follow through with any testing or recommended followup office visits as was discussed today. You are due the following test coming up:  Colonoscopy up to date. Due 07/2027 Hep C screening: Postpone to do with next lab draw Pneumonia vaccine postpone to do December 7 Flu Vaccine Postpone to do December 7    Finally remembered that the annual wellness visit does not take the place of regularly scheduled office visits  chronic health problems such as hypertension/diabetes/cholesterol visits.

## 2022-08-06 NOTE — Progress Notes (Signed)
   Subjective:    Patient ID: Ricardo Pacheco, male    DOB: 1956-03-13, 66 y.o.   MRN: [id number]  HPI  AWV- Annual Wellness Visit  The patient was seen for their annual wellness visit. The patient's past medical history, surgical history, and family history were reviewed. Pertinent vaccines were reviewed ( tetanus, pneumonia, shingles, flu) The patient's medication list was reviewed and updated.  The height and weight were entered.  BMI recorded in electronic record elsewhere  Cognitive screening was completed. Outcome of Mini - Cog: 5   Falls /depression screening electronically recorded within record elsewhere  Current tobacco usage:no (All patients who use tobacco were given written and verbal information on quitting)  Patient stopped smoking 16 years ago.   Recent listing of emergency department/hospitalizations over the past year were reviewed.  current specialist the patient sees on a regular basis: orthopedics, pain management, urology   Medicare annual wellness visit patient questionnaire was reviewed.  A written screening schedule for the patient for the next 5-10 years was given. Appropriate discussion of followup regarding next visit was discussed.     Review of Systems  All other systems reviewed and are negative.      Objective:   Physical Exam Vitals reviewed.  Constitutional:      General: He is not in acute distress.    Appearance: Normal appearance. He is normal weight. He is not ill-appearing, toxic-appearing or diaphoretic.  HENT:     Head: Normocephalic and atraumatic.  Neurological:     Mental Status: He is alert.  Psychiatric:        Mood and Affect: Mood normal.        Behavior: Behavior normal.           Assessment & Plan:   1. Medicare annual wellness visit, initial Adult wellness-complete.wellness physical was conducted today. Importance of diet and exercise were discussed in detail.  Importance of stress reduction and healthy  living were discussed.  In addition to this a discussion regarding safety was also covered.  We also reviewed over immunizations and gave recommendations regarding current immunization needed for age.   In addition to this additional areas were also touched on including: Preventative health exams needed:  -Colonoscopy up to date. Due 07/2027 -Hep C screening: Postpone to do with next lab draw -Pneumonia vaccine postpone to do on December 7 (patient had Shingles vaccine on 07/25/2022) -Flu Vaccine Postpone to do on December 7 (patient had Shingles vaccine on 07/25/2022)  Follow up with Dr. Lacinda Axon in 3 months for routine follow up  Patient was advised yearly wellness exam

## 2022-08-22 ENCOUNTER — Ambulatory Visit (INDEPENDENT_AMBULATORY_CARE_PROVIDER_SITE_OTHER): Payer: Medicare Other | Admitting: *Deleted

## 2022-08-22 DIAGNOSIS — Z23 Encounter for immunization: Secondary | ICD-10-CM | POA: Diagnosis not present

## 2022-10-03 ENCOUNTER — Other Ambulatory Visit: Payer: No Typology Code available for payment source

## 2022-10-04 LAB — PSA: Prostate Specific Ag, Serum: 0.5 ng/mL (ref 0.0–4.0)

## 2022-10-10 ENCOUNTER — Ambulatory Visit: Payer: Medicare Other | Admitting: Urology

## 2022-10-10 ENCOUNTER — Encounter: Payer: Self-pay | Admitting: Urology

## 2022-10-10 VITALS — BP 118/74 | HR 77

## 2022-10-10 DIAGNOSIS — Z8546 Personal history of malignant neoplasm of prostate: Secondary | ICD-10-CM

## 2022-10-10 DIAGNOSIS — N401 Enlarged prostate with lower urinary tract symptoms: Secondary | ICD-10-CM

## 2022-10-10 DIAGNOSIS — N138 Other obstructive and reflux uropathy: Secondary | ICD-10-CM

## 2022-10-10 DIAGNOSIS — R351 Nocturia: Secondary | ICD-10-CM | POA: Diagnosis not present

## 2022-10-10 DIAGNOSIS — R3915 Urgency of urination: Secondary | ICD-10-CM

## 2022-10-10 LAB — URINALYSIS, ROUTINE W REFLEX MICROSCOPIC
Bilirubin, UA: NEGATIVE
Glucose, UA: NEGATIVE
Leukocytes,UA: NEGATIVE
Nitrite, UA: NEGATIVE
Protein,UA: NEGATIVE
RBC, UA: NEGATIVE
Specific Gravity, UA: 1.02 (ref 1.005–1.030)
Urobilinogen, Ur: 0.2 mg/dL (ref 0.2–1.0)
pH, UA: 5 (ref 5.0–7.5)

## 2022-10-10 MED ORDER — TAMSULOSIN HCL 0.4 MG PO CAPS: 0.4000 mg | ORAL_CAPSULE | Freq: Every day | ORAL | 3 refills | Status: DC

## 2022-10-10 NOTE — Progress Notes (Signed)
Subjective: 1. Personal history of prostate cancer   2. BPH with urinary obstruction   3. Nocturia   4. Urgency of urination        10/10/22: Ricardo Pacheco returns in f/u for his history of prostate cancer treated with a seed implant on 11/24/20.  He had T1c Nx Mx prostate cancer with 2 cores of Gleason 7(3+4) disease with >50% involvement with one on each side  and 8 cores of Gleason 6 disease with up to 80% involvement with 4 on each side.   He is doing well post op and has no significant associated signs or symptoms.  His PSA is down to 0.5 from  0.6 in 7/23.  He has no weight loss or bone pain.    He has had well preserved erectile function .   He has BPH with BOO and is on tamsulosin with moderate LUTS.  His IPSS is 6  with nocturia x 2-3.  He has had no hematuria.  UA is clear.     Gu hx: Ricardo Pacheco is a 67 yo AAM who is sent by Elvia Collum DO for an elevated PSA of 4.6 on 05/24/20 which is up from 2.5 in 10/20 and 2.4 in 9/19.  He has moderate LUTS with some urgency and nocturia that is worse with fluids.  His IPSS is 12.  He has been on tamsulosin for several years.  He is on chronic hydrocodone for chronic back pain but he only takes it intermittently when the epidurals wear off.  He has no history of UTI's, stone or GU surgery.  He has two brother with prostate cancer in their upper 21's.        ROS:  Review of Systems  Musculoskeletal:  Positive for back pain and joint pain.  All other systems reviewed and are negative.   No Known Allergies  Past Medical History:  Diagnosis Date   Arthritis    djd back back brace prn   Decreased white blood cell count 2018   resolved    Hypercholesteremia    no meds taken   Prostate cancer ([place])    Prostate cancer ([place])    Wears glasses    Wears partial dentures    lower    Past Surgical History:  Procedure Laterality Date   COLONOSCOPY N/A 07/18/2017   Procedure: COLONOSCOPY;  Surgeon: Danie Binder, MD;  [facility];  Service: Endoscopy;  Laterality: N/A;  2:15 pm   MENISCUS REPAIR Left 03/2020   [facility]   POLYPECTOMY  07/18/2017   Procedure: POLYPECTOMY;  Surgeon: Danie Binder, MD;  [facility];  Service: Endoscopy;;  hepatic flexure   PROSTATE BIOPSY     in office   RADIOACTIVE SEED IMPLANT N/A 11/24/2020   Procedure: RADIOACTIVE SEED IMPLANT/BRACHYTHERAPY IMPLANT;  Surgeon: Irine Seal, MD;  Location: [facility];  Service: Urology;  Laterality: N/A;   SPACE OAR INSTILLATION N/A 11/24/2020   Procedure: SPACE OAR INSTILLATION;  Surgeon: Irine Seal, MD;  Location: [facility];  Service: Urology;  Laterality: N/A;    Social History   Socioeconomic History   Marital status: Married    Spouse name: Not on file   Number of children: 2   Years of education: Not on file   Highest education level: Not on file  Occupational History   Occupation: [place] [employer]    Comment: full time; plans to retire again in May  Tobacco  Use   Smoking status: Former    Packs/day: 1.00    Years: 28.00    Total pack years: 28.00    Types: Cigarettes    Quit date: 09/16/2000    Years since quitting: 22.0   Smokeless tobacco: Never  Vaping Use   Vaping Use: Never used  Substance and Sexual Activity   Alcohol use: No   Drug use: No   Sexual activity: Yes  Other Topics Concern   Not on file  Social History Narrative   Not on file   Social Determinants of Health   Financial Resource Strain: Not on file  Food Insecurity: Not on file  Transportation Needs: Not on file  Physical Activity: Not on file  Stress: Not on file  Social Connections: Not on file  Intimate Partner Violence: Not on file    Family History  Problem Relation Age of Onset   Hypertension Mother    Stroke Father    Prostate cancer Brother    Throat cancer Brother    Prostate cancer Brother    Colon cancer Neg Hx    Colon polyps Neg Hx    Breast cancer Neg Hx     Pancreatic cancer Neg Hx     Anti-infectives: Anti-infectives (From admission, onward)    None       Current Outpatient Medications  Medication Sig Dispense Refill   HYDROcodone-acetaminophen (NORCO) 10-325 MG tablet Take 1 tablet by mouth every 6 (six) hours as needed for moderate pain., 60 tablet 0   methocarbamol (ROBAXIN) 500 MG tablet Take 500 mg by mouth 4 (four) times daily.     Multiple Vitamin (MULTIVITAMIN WITH MINERALS) TABS tablet Take 1 tablet by mouth daily.     nabumetone (RELAFEN) 500 MG tablet TAKE ONE TABLET TWICE A DAY WITH FOOD AS NEEDED. 42 tablet 3   tamsulosin (FLOMAX) 0.4 MG CAPS capsule Take 1 capsule (0.4 mg total) by mouth daily after supper. 90 capsule 3   No current facility-administered medications for this visit.     Objective: Vital signs in last 24 hours: BP 118/74   Pulse 77   Intake/Output from previous day: No intake/output data recorded. Intake/Output this shift: '@IOTHISSHIFT'$ @   Physical Exam Vitals reviewed.  Constitutional:      Appearance: Normal appearance.  Genitourinary:    Comments: AP/NST with mass. Prostate smooth and flat.  Neurological:     Mental Status: He is alert.      Studies/Results:  Lab Results  Component Value Date   PSA1 0.5 10/03/2022   PSA1 0.6 04/04/2022   PSA1 0.8 10/04/2021   Results for orders placed or performed in visit on 10/10/22 (from the past 24 hour(s))  Urinalysis, Routine w reflex microscopic     Status: Abnormal   Collection Time: 10/10/22 10:25 AM  Result Value Ref Range   Specific Gravity, UA 1.020 1.005 - 1.030   pH, UA 5.0 5.0 - 7.5   Color, UA Yellow Yellow   Appearance Ur Clear Clear   Leukocytes,UA Negative Negative   Protein,UA Negative Negative/Trace   Glucose, UA Negative Negative   Ketones, UA Trace (A) Negative   RBC, UA Negative Negative   Bilirubin, UA Negative Negative   Urobilinogen, Ur 0.2 0.2 - 1.0 mg/dL   Nitrite, UA Negative Negative   Microscopic  Examination Comment    Narrative   Performed at:  [facility] [address], [place], [place]  [id number] Lab Director: Mina Marble MT, Phone:  [phone]     UA is clear  Assessment/Plan: Prostate cancer:  T1c Nx Mx Gleason 7(3+4) prostate cancer.  He is doing well s/p seeds with a further decline in the PSA.  He will return in 6 months with a PSA.      BPH with BOO.  He is voiding ok on tamsulosin   Meds ordered this encounter  Medications   tamsulosin (FLOMAX) 0.4 MG CAPS capsule    Sig: Take 1 capsule (0.4 mg total) by mouth daily after supper.    Dispense:  90 capsule    Refill:  3      Orders Placed This Encounter  Procedures   Urinalysis, Routine w reflex microscopic   PSA    Standing Status:   Future    Standing Expiration Date:   10/11/2023     Return in about 6 months (around 04/10/2023) for with PSA.    CC: Dr. Elvia Collum    Irine Seal 10/11/2022 [phone] ID: Ricardo Pacheco, male   DOB: [birth year], 67 y.o.   MRN: [id number]

## 2022-10-31 DIAGNOSIS — M5416 Radiculopathy, lumbar region: Secondary | ICD-10-CM | POA: Diagnosis not present

## 2022-11-05 DIAGNOSIS — M5416 Radiculopathy, lumbar region: Secondary | ICD-10-CM | POA: Diagnosis not present

## 2022-11-06 ENCOUNTER — Ambulatory Visit (INDEPENDENT_AMBULATORY_CARE_PROVIDER_SITE_OTHER): Payer: Medicare Other | Admitting: Family Medicine

## 2022-11-06 ENCOUNTER — Encounter: Payer: Self-pay | Admitting: Family Medicine

## 2022-11-06 VITALS — BP 128/78 | HR 83 | Temp 97.7°F | Ht 73.0 in | Wt 218.0 lb

## 2022-11-06 DIAGNOSIS — R7303 Prediabetes: Secondary | ICD-10-CM

## 2022-11-06 DIAGNOSIS — E785 Hyperlipidemia, unspecified: Secondary | ICD-10-CM | POA: Insufficient documentation

## 2022-11-06 DIAGNOSIS — N400 Enlarged prostate without lower urinary tract symptoms: Secondary | ICD-10-CM | POA: Insufficient documentation

## 2022-11-06 NOTE — Patient Instructions (Signed)
You're doing well.  Follow up in 6 months to 1 year.  Take care  Dr. Lacinda Axon

## 2022-11-07 DIAGNOSIS — R7303 Prediabetes: Secondary | ICD-10-CM | POA: Insufficient documentation

## 2022-11-07 NOTE — Assessment & Plan Note (Signed)
Stable on Flomax.  Continue.

## 2022-11-07 NOTE — Assessment & Plan Note (Signed)
Mild hyperlipidemia.  Advised to watch diet and stay as active as he can.

## 2022-11-07 NOTE — Progress Notes (Signed)
Subjective:  Patient ID: Ricardo Pacheco, male    DOB: 1955-12-03  Age: 67 y.o. MRN: [id number]  CC: Chief Complaint  Patient presents with   Establish Care    HPI:  67 year old male presents to establish care with me.  Patient states that other than his chronic back pain he is doing well.  He is followed by physician at [facility] for his back pain.  Recently had an injection.  Patient also sees the [place].  He brought in his lab work with him from his last visit from the [place].  Blood work was done in September 2023.  LDL was mildly elevated at 108.  Patient prediabetic with an A1c of 6.  Patient's BPH is stable on Flomax.  Patient Active Problem List   Diagnosis Date Noted   Prediabetes 11/07/2022   BPH (benign prostatic hyperplasia) 11/06/2022   Hyperlipidemia 11/06/2022   Malignant neoplasm of prostate ([place]) 09/26/2020   Chronic back pain 12/11/2012    Social Hx   Social History   Socioeconomic History   Marital status: Married    Spouse name: Not on file   Number of children: 2   Years of education: Not on file   Highest education level: Not on file  Occupational History   Occupation: [place] [employer]    Comment: full time; plans to retire again in May  Tobacco Use   Smoking status: Former    Packs/day: 1.00    Years: 28.00    Total pack years: 28.00    Types: Cigarettes    Quit date: 09/16/2000    Years since quitting: 22.1   Smokeless tobacco: Never  Vaping Use   Vaping Use: Never used  Substance and Sexual Activity   Alcohol use: No   Drug use: No   Sexual activity: Yes  Other Topics Concern   Not on file  Social History Narrative   Not on file   Social Determinants of Health   Financial Resource Strain: Not on file  Food Insecurity: Not on file  Transportation Needs: Not on file  Physical Activity: Not on file  Stress: Not on file  Social Connections: Not on file    Review of Systems  Constitutional: Negative.   Musculoskeletal:  Positive for  back pain.   Objective:  BP 128/78   Pulse 83   Temp 97.7 F (36.5 C)   Ht 6' 1"$  (1.854 m)   Wt 218 lb (98.9 kg)   SpO2 99%   BMI 28.76 kg/m      11/06/2022    9:34 AM 10/10/2022   10:21 AM 08/06/2022    8:51 AM  BP/Weight  Systolic BP 0000000 [id number]  Diastolic BP 78 74 70  Wt. (Lbs) 218  217  BMI 28.76 kg/m2  28.63 kg/m2    Physical Exam Vitals and nursing note reviewed.  Constitutional:      General: He is not in acute distress.    Appearance: Normal appearance.  HENT:     Head: Normocephalic and atraumatic.  Eyes:     General:        Right eye: No discharge.        Left eye: No discharge.     Conjunctiva/sclera: Conjunctivae normal.  Cardiovascular:     Rate and Rhythm: Normal rate and regular rhythm.  Pulmonary:     Effort: Pulmonary effort is normal.     Breath sounds: Normal breath sounds. No wheezing, rhonchi or rales.  Neurological:  Mental Status: He is alert.  Psychiatric:        Mood and Affect: Mood normal.        Behavior: Behavior normal.     Lab Results  Component Value Date   WBC 3.3 (L) 11/22/2020   HGB 13.1 11/22/2020   HCT 41.3 11/22/2020   PLT 336 11/22/2020   GLUCOSE 108 (H) 11/22/2020   CHOL 214 (H) 05/24/2020   TRIG 54 05/24/2020   HDL 66 05/24/2020   LDLCALC 138 (H) 05/24/2020   ALT 28 11/22/2020   AST 22 11/22/2020   NA 139 11/22/2020   K 3.8 11/22/2020   CL 105 11/22/2020   CREATININE 0.78 11/22/2020   BUN 10 11/22/2020   CO2 26 11/22/2020   PSA 1.35 05/06/2014   INR 1.0 11/22/2020     Assessment & Plan:   Problem List Items Addressed This Visit       Genitourinary   BPH (benign prostatic hyperplasia) - Primary    Stable on Flomax.  Continue.        Other   Prediabetes    Will continue monitor closely.  Watch diet.  Stay active.      Hyperlipidemia    Mild hyperlipidemia.  Advised to watch diet and stay as active as he can.      Follow-up: 6 months to 1 year  [place]

## 2022-11-07 NOTE — Assessment & Plan Note (Signed)
Will continue monitor closely.  Watch diet.  Stay active.

## 2023-02-27 DIAGNOSIS — M5136 Other intervertebral disc degeneration, lumbar region: Secondary | ICD-10-CM | POA: Diagnosis not present

## 2023-02-27 DIAGNOSIS — M549 Dorsalgia, unspecified: Secondary | ICD-10-CM | POA: Diagnosis not present

## 2023-02-27 DIAGNOSIS — Z5181 Encounter for therapeutic drug level monitoring: Secondary | ICD-10-CM | POA: Diagnosis not present

## 2023-02-27 DIAGNOSIS — Z79899 Other long term (current) drug therapy: Secondary | ICD-10-CM | POA: Diagnosis not present

## 2023-02-27 DIAGNOSIS — M5416 Radiculopathy, lumbar region: Secondary | ICD-10-CM | POA: Diagnosis not present

## 2023-04-03 ENCOUNTER — Other Ambulatory Visit: Payer: Medicare Other

## 2023-04-03 DIAGNOSIS — Z8546 Personal history of malignant neoplasm of prostate: Secondary | ICD-10-CM

## 2023-04-04 LAB — PSA: Prostate Specific Ag, Serum: 0.4 ng/mL (ref 0.0–4.0)

## 2023-04-10 ENCOUNTER — Ambulatory Visit: Payer: Medicare Other | Admitting: Urology

## 2023-05-01 ENCOUNTER — Encounter: Payer: Self-pay | Admitting: Urology

## 2023-05-01 ENCOUNTER — Ambulatory Visit: Payer: Medicare Other | Admitting: Urology

## 2023-05-01 VITALS — BP 109/65 | HR 73 | Ht 73.0 in | Wt 218.0 lb

## 2023-05-01 DIAGNOSIS — R351 Nocturia: Secondary | ICD-10-CM

## 2023-05-01 DIAGNOSIS — N138 Other obstructive and reflux uropathy: Secondary | ICD-10-CM | POA: Diagnosis not present

## 2023-05-01 DIAGNOSIS — Z8546 Personal history of malignant neoplasm of prostate: Secondary | ICD-10-CM

## 2023-05-01 DIAGNOSIS — N401 Enlarged prostate with lower urinary tract symptoms: Secondary | ICD-10-CM

## 2023-05-01 LAB — URINALYSIS, ROUTINE W REFLEX MICROSCOPIC
Bilirubin, UA: NEGATIVE
Glucose, UA: NEGATIVE
Leukocytes,UA: NEGATIVE
Nitrite, UA: NEGATIVE
Protein,UA: NEGATIVE
RBC, UA: NEGATIVE
Specific Gravity, UA: 1.03 (ref 1.005–1.030)
Urobilinogen, Ur: 0.2 mg/dL (ref 0.2–1.0)
pH, UA: 5.5 (ref 5.0–7.5)

## 2023-05-01 MED ORDER — TAMSULOSIN HCL 0.4 MG PO CAPS: 0.4000 mg | ORAL_CAPSULE | Freq: Every day | ORAL | 3 refills | Status: DC

## 2023-05-01 NOTE — Progress Notes (Signed)
Subjective: 1. Personal history of prostate cancer   2. BPH with urinary obstruction   3. Nocturia     05/01/23: Ricardo Pacheco returns today in f/u.  His PSA is down to 0.4 from 0.5 in 1/24.   He had a seed implant in 3/22 for GG2 disease. He has an IPSS of 8 with some frequency and nocturia x 3.   He remains no tamsulosin.  He has no ED.     10/10/22: Ricardo Pacheco returns in f/u for his history of prostate cancer treated with a seed implant on 11/24/20.  He had T1c Nx Mx prostate cancer with 2 cores of Gleason 7(3+4) disease with >50% involvement with one on each side  and 8 cores of Gleason 6 disease with up to 80% involvement with 4 on each side.   He is doing well post op and has no significant associated signs or symptoms.  His PSA is down to 0.5 from  0.6 in 7/23.  He has no weight loss or bone pain.    He has had well preserved erectile function .   He has BPH with BOO and is on tamsulosin with moderate LUTS.  His IPSS is 6  with nocturia x 2-3.  He has had no hematuria.  UA is clear.     Gu hx: Ricardo Pacheco is a 67 yo AAM who is sent by Laroy Apple DO for an elevated PSA of 4.6 on 05/24/20 which is up from 2.5 in 10/20 and 2.4 in 9/19.  He has moderate LUTS with some urgency and nocturia that is worse with fluids.  His IPSS is 12.  He has been on tamsulosin for several years.  He is on chronic hydrocodone for chronic back pain but he only takes it intermittently when the epidurals wear off.  He has no history of UTI's, stone or GU surgery.  He has two brother with prostate cancer in their upper 41's.      IPSS     Row Name 05/01/23 1000         International Prostate Symptom Score   How often have you had the sensation of not emptying your bladder? Not at All     How often have you had to urinate less than every two hours? About half the time     How often have you found you stopped and started again several times when you urinated? Not at All     How often have you found it difficult to  postpone urination? Less than half the time     How often have you had a weak urinary stream? Not at All     How often have you had to strain to start urination? Not at All     How many times did you typically get up at night to urinate? 3 Times     Total IPSS Score 8               ROS:  Review of Systems  Musculoskeletal:  Positive for back pain and joint pain.  All other systems reviewed and are negative.   No Known Allergies  Past Medical History:  Diagnosis Date   Arthritis    djd back back brace prn   Decreased white blood cell count 2018   resolved    Hypercholesteremia    no meds taken   Polyp of hepatic flexure of colon    Prostate cancer ([facility])    Prostate cancer ([facility])  Wears glasses    Wears partial dentures    lower    Past Surgical History:  Procedure Laterality Date   COLONOSCOPY N/A 07/18/2017   Procedure: COLONOSCOPY;  Surgeon: West Bali, MD;  [facility];  Service: Endoscopy;  Laterality: N/A;  2:15 pm   MENISCUS REPAIR Left 03/2020   [facility]   POLYPECTOMY  07/18/2017   Procedure: POLYPECTOMY;  Surgeon: West Bali, MD;  [facility];  Service: Endoscopy;;  hepatic flexure   PROSTATE BIOPSY     in office   RADIOACTIVE SEED IMPLANT N/A 11/24/2020   Procedure: RADIOACTIVE SEED IMPLANT/BRACHYTHERAPY IMPLANT;  Surgeon: Bjorn Pippin, MD;  Location: [facility];  Service: Urology;  Laterality: N/A;   SPACE OAR INSTILLATION N/A 11/24/2020   Procedure: SPACE OAR INSTILLATION;  Surgeon: Bjorn Pippin, MD;  Location: [facility];  Service: Urology;  Laterality: N/A;    Social History   Socioeconomic History   Marital status: Married    Spouse name: Not on file   Number of children: 2   Years of education: Not on file   Highest education level: Not on file  Occupational History   Occupation: [place] [employer]    Comment: full time; plans to retire again in May  Tobacco Use    Smoking status: Former    Current packs/day: 0.00    Average packs/day: 1 pack/day for 28.0 years (28.0 ttl pk-yrs)    Types: Cigarettes    Start date: 09/16/1972    Quit date: 09/16/2000    Years since quitting: 22.6   Smokeless tobacco: Never  Vaping Use   Vaping status: Never Used  Substance and Sexual Activity   Alcohol use: No   Drug use: No   Sexual activity: Yes  Other Topics Concern   Not on file  Social History Narrative   Not on file   Social Determinants of Health   Financial Resource Strain: Not on file  Food Insecurity: Not on file  Transportation Needs: Not on file  Physical Activity: Not on file  Stress: Not on file  Social Connections: Not on file  Intimate Partner Violence: Not on file    Family History  Problem Relation Age of Onset   Hypertension Mother    Stroke Father    Prostate cancer Brother    Throat cancer Brother    Prostate cancer Brother    Colon cancer Neg Hx    Colon polyps Neg Hx    Breast cancer Neg Hx    Pancreatic cancer Neg Hx     Anti-infectives: Anti-infectives (From admission, onward)    None       Current Outpatient Medications  Medication Sig Dispense Refill   HYDROcodone-acetaminophen (NORCO) 10-325 MG tablet Take 1 tablet by mouth every 6 (six) hours as needed for moderate pain., 60 tablet 0   methocarbamol (ROBAXIN) 500 MG tablet Take 500 mg by mouth 4 (four) times daily.     Multiple Vitamin (MULTIVITAMIN WITH MINERALS) TABS tablet Take 1 tablet by mouth daily.     nabumetone (RELAFEN) 500 MG tablet TAKE ONE TABLET TWICE A DAY WITH FOOD AS NEEDED. 42 tablet 3   tamsulosin (FLOMAX) 0.4 MG CAPS capsule Take 1 capsule (0.4 mg total) by mouth daily after supper. 90 capsule 3   No current facility-administered medications for this visit.     Objective: Vital signs in last 24 hours: BP 109/65   Pulse 73  Ht 6\' 1"  (1.854 m)   Wt 218 lb (98.9 kg)   BMI 28.76 kg/m   Intake/Output from previous day: No  intake/output data recorded. Intake/Output this shift: @IOTHISSHIFT @   Physical Exam Vitals reviewed.  Constitutional:      Appearance: Normal appearance.  Neurological:     Mental Status: He is alert.      Studies/Results:  Lab Results  Component Value Date   PSA1 0.4 04/03/2023   PSA1 0.5 10/03/2022   PSA1 0.6 04/04/2022   Results for orders placed or performed in visit on 05/01/23 (from the past 24 hour(s))  Urinalysis, Routine w reflex microscopic     Status: Abnormal   Collection Time: 05/01/23 10:33 AM  Result Value Ref Range   Specific Gravity, UA 1.030 1.005 - 1.030   pH, UA 5.5 5.0 - 7.5   Color, UA Yellow Yellow   Appearance Ur Clear Clear   Leukocytes,UA Negative Negative   Protein,UA Negative Negative/Trace   Glucose, UA Negative Negative   Ketones, UA Trace (A) Negative   RBC, UA Negative Negative   Bilirubin, UA Negative Negative   Urobilinogen, Ur 0.2 0.2 - 1.0 mg/dL   Nitrite, UA Negative Negative   Microscopic Examination Comment    Narrative   Performed at:  [address] [facility] [address] [place], [place]  [id number] Lab Director: Chinita Pester MT, Phone:  [phone]      UA is clear  Assessment/Plan: Prostate cancer:  T1c Nx Mx Gleason 7(3+4) prostate cancer.  He is doing well s/p seeds with a further decline in the PSA.  He will return in 6 months with a PSA.      BPH with BOO.  He is voiding ok on tamsulosin   Meds ordered this encounter  Medications   tamsulosin (FLOMAX) 0.4 MG CAPS capsule    Sig: Take 1 capsule (0.4 mg total) by mouth daily after supper.    Dispense:  90 capsule    Refill:  3      Orders Placed This Encounter  Procedures   Urinalysis, Routine w reflex microscopic   PSA    Standing Status:   Future    Standing Expiration Date:   04/30/2024     Return in about 6 months (around 11/01/2023) for with PSA.    CC: Dr. Laroy Apple    Bjorn Pippin 05/02/2023 [phone] ID: Clotilde Dieter, male   DOB: 01/24/1956, 67 y.o.   MRN: [id number]

## 2023-05-07 ENCOUNTER — Ambulatory Visit: Payer: Medicare Other | Admitting: Family Medicine

## 2023-05-08 ENCOUNTER — Telehealth: Payer: Medicare Other | Admitting: Nurse Practitioner

## 2023-05-08 DIAGNOSIS — U071 COVID-19: Secondary | ICD-10-CM | POA: Diagnosis not present

## 2023-05-08 MED ORDER — IPRATROPIUM BROMIDE 0.03 % NA SOLN: 2.0000 | Freq: Two times a day (BID) | NASAL | 12 refills | Status: DC

## 2023-05-08 MED ORDER — NIRMATRELVIR/RITONAVIR (PAXLOVID)TABLET: 3.0000 | ORAL_TABLET | Freq: Two times a day (BID) | ORAL | 0 refills | Status: AC

## 2023-05-08 MED ORDER — BENZONATATE 100 MG PO CAPS: 100.0000 mg | ORAL_CAPSULE | Freq: Three times a day (TID) | ORAL | 0 refills | Status: DC | PRN

## 2023-05-08 NOTE — Progress Notes (Signed)
Virtual Visit Consent   Ishanth Biggins, you are scheduled for a virtual visit with a [facility] provider today. Just as with appointments in the office, your consent must be obtained to participate. Your consent will be active for this visit and any virtual visit you may have with one of our providers in the next 365 days. If you have a MyChart account, a copy of this consent can be sent to you electronically.  As this is a virtual visit, video technology does not allow for your provider to perform a traditional examination. This may limit your provider's ability to fully assess your condition. If your provider identifies any concerns that need to be evaluated in person or the need to arrange testing (such as labs, EKG, etc.), we will make arrangements to do so. Although advances in technology are sophisticated, we cannot ensure that it will always work on either your end or our end. If the connection with a video visit is poor, the visit may have to be switched to a telephone visit. With either a video or telephone visit, we are not always able to ensure that we have a secure connection.  By engaging in this virtual visit, you consent to the provision of healthcare and authorize for your insurance to be billed (if applicable) for the services provided during this visit. Depending on your insurance coverage, you may receive a charge related to this service.  I need to obtain your verbal consent now. Are you willing to proceed with your visit today? Ricardo Pacheco has provided verbal consent on 05/08/2023 for a virtual visit (video or telephone). Viviano Simas, FNP  Date: 05/08/2023 10:13 AM  Virtual Visit via Video Note   I, Viviano Simas, connected with  Ricardo Pacheco  ([id number], [birth year]) on 05/08/23 at 10:15 AM EDT by a video-enabled telemedicine application and verified that I am speaking with the correct person using two identifiers.  Location: Patient: Virtual Visit Location Patient:  Home Provider: Virtual Visit Location Provider: Home Office   I discussed the limitations of evaluation and management by telemedicine and the availability of in person appointments. The patient expressed understanding and agreed to proceed.    History of Present Illness: Ricardo Pacheco is a 67 y.o. who identifies as a male who was assigned male at birth, and is being seen today after testing positive for COVID-19  Symptom onset was 2 days ago  Symptoms today include: Cough, body aches, congestion, fever 100 yesterday, 99.5 today   He has not had COVID in the past   He has been vaccinated for COVID in the past without recent booster   Denies a history of DM, HTN, asthma/COPD  Denies needing inhalers in the past    [facility] in July was 83   Problems:  Patient Active Problem List   Diagnosis Date Noted   Prediabetes 11/07/2022   BPH (benign prostatic hyperplasia) 11/06/2022   Hyperlipidemia 11/06/2022   Malignant neoplasm of prostate ([facility]) 09/26/2020   Chronic back pain 12/11/2012    Allergies: No Known Allergies Medications:  Current Outpatient Medications:    HYDROcodone-acetaminophen (NORCO) 10-325 MG tablet, Take 1 tablet by mouth every 6 (six) hours as needed for moderate pain.,, Disp: 60 tablet, Rfl: 0   methocarbamol (ROBAXIN) 500 MG tablet, Take 500 mg by mouth 4 (four) times daily., Disp: , Rfl:    Multiple Vitamin (MULTIVITAMIN WITH MINERALS) TABS tablet, Take 1 tablet by mouth daily., Disp: , Rfl:    nabumetone (RELAFEN)  500 MG tablet, TAKE ONE TABLET TWICE A DAY WITH FOOD AS NEEDED., Disp: 42 tablet, Rfl: 3   tamsulosin (FLOMAX) 0.4 MG CAPS capsule, Take 1 capsule (0.4 mg total) by mouth daily after supper., Disp: 90 capsule, Rfl: 3  Observations/Objective: Patient is well-developed, well-nourished in no acute distress.  Resting comfortably  at home.  Head is normocephalic, atraumatic.  No labored breathing.  Speech is clear and coherent with logical  content.  Patient is alert and oriented at baseline.    Assessment and Plan:  1. COVID-19  *take anti-viral with food   - nirmatrelvir/ritonavir (PAXLOVID) 20 x 150 MG & 10 x 100MG  TABS; Take 3 tablets by mouth 2 (two) times daily for 5 days. (Take nirmatrelvir 150 mg two tablets twice daily for 5 days and ritonavir 100 mg one tablet twice daily for 5 days) Patient GFR is 83  Dispense: 30 tablet; Refill: 0 - benzonatate (TESSALON) 100 MG capsule; Take 1 capsule (100 mg total) by mouth 3 (three) times daily as needed.  Dispense: 30 capsule; Refill: 0 - ipratropium (ATROVENT) 0.03 % nasal spray; Place 2 sprays into both nostrils every 12 (twelve) hours.  Dispense: 30 mL; Refill: 12    CDC guidelines reviewed for isolation  Manage symptoms with over the counter medications as discussed    Follow Up Instructions: I discussed the assessment and treatment plan with the patient. The patient was provided an opportunity to ask questions and all were answered. The patient agreed with the plan and demonstrated an understanding of the instructions.  A copy of instructions were sent to the patient via MyChart unless otherwise noted below.     The patient was advised to call back or seek an in-person evaluation if the symptoms worsen or if the condition fails to improve as anticipated.  Time:  I spent 13 minutes with the patient via telehealth technology discussing the above problems/concerns.    Viviano Simas, FNP

## 2023-06-14 DIAGNOSIS — M5416 Radiculopathy, lumbar region: Secondary | ICD-10-CM | POA: Diagnosis not present

## 2023-10-24 ENCOUNTER — Other Ambulatory Visit: Payer: Medicare Other

## 2023-10-24 DIAGNOSIS — Z8546 Personal history of malignant neoplasm of prostate: Secondary | ICD-10-CM

## 2023-10-25 LAB — PSA: Prostate Specific Ag, Serum: 0.2 ng/mL (ref 0.0–4.0)

## 2023-10-30 ENCOUNTER — Ambulatory Visit: Payer: Medicare Other | Admitting: Urology

## 2023-10-30 DIAGNOSIS — H25813 Combined forms of age-related cataract, bilateral: Secondary | ICD-10-CM | POA: Diagnosis not present

## 2023-11-06 ENCOUNTER — Encounter: Payer: Self-pay | Admitting: Urology

## 2023-11-06 ENCOUNTER — Ambulatory Visit: Payer: Medicare Other | Admitting: Urology

## 2023-11-06 VITALS — BP 130/79 | HR 69

## 2023-11-06 DIAGNOSIS — R351 Nocturia: Secondary | ICD-10-CM | POA: Diagnosis not present

## 2023-11-06 DIAGNOSIS — Z8546 Personal history of malignant neoplasm of prostate: Secondary | ICD-10-CM | POA: Diagnosis not present

## 2023-11-06 DIAGNOSIS — N138 Other obstructive and reflux uropathy: Secondary | ICD-10-CM | POA: Diagnosis not present

## 2023-11-06 DIAGNOSIS — N401 Enlarged prostate with lower urinary tract symptoms: Secondary | ICD-10-CM

## 2023-11-06 NOTE — Progress Notes (Unsigned)
Subjective: 1. BPH with urinary obstruction   2. Personal history of prostate cancer   3. Nocturia     11/06/23: Ricardo Pacheco returns today in f/u.  His PSA is down further to 0.2 following a seed implant in 3/22. He remains on tamsulosin. He has nocturia 2-3x.  He has daytime frequency with coffee.  He has no hematuria.  He has no weight loss or new bone pain.  He is going to have bilateral cataract extractions.   05/01/23: Ricardo Pacheco returns today in f/u.  His PSA is down to 0.4 from 0.5 in 1/24.   He had a seed implant in 3/22 for GG2 disease. He has an IPSS of 8 with some frequency and nocturia x 3.   He remains no tamsulosin.  He has no ED.     10/10/22: Ricardo Pacheco returns in f/u for his history of prostate cancer treated with a seed implant on 11/24/20.  He had T1c Nx Mx prostate cancer with 2 cores of Gleason 7(3+4) disease with >50% involvement with one on each side  and 8 cores of Gleason 6 disease with up to 80% involvement with 4 on each side.   He is doing well post op and has no significant associated signs or symptoms.  His PSA is down to 0.5 from  0.6 in 7/23.  He has no weight loss or bone pain.    He has had well preserved erectile function .   He has BPH with BOO and is on tamsulosin with moderate LUTS.  His IPSS is 6  with nocturia x 2-3.  He has had no hematuria.  UA is clear.     Gu hx: Ricardo Pacheco is a 68 yo AAM who is sent by Laroy Apple DO for an elevated PSA of 4.6 on 05/24/20 which is up from 2.5 in 10/20 and 2.4 in 9/19.  He has moderate LUTS with some urgency and nocturia that is worse with fluids.  His IPSS is 12.  He has been on tamsulosin for several years.  He is on chronic hydrocodone for chronic back pain but he only takes it intermittently when the epidurals wear off.  He has no history of UTI's, stone or GU surgery.  He has two brother with prostate cancer in their upper 62's.         ROS:  Review of Systems  Musculoskeletal:  Positive for back pain and joint pain.   All other systems reviewed and are negative.   No Known Allergies  Past Medical History:  Diagnosis Date   Arthritis    djd back back brace prn   Cataracts, bilateral    Decreased white blood cell count 2018   resolved    Hypercholesteremia    no meds taken   Polyp of hepatic flexure of colon    Prostate cancer (HCC)    Prostate cancer (HCC)    Wears glasses    Wears partial dentures    lower    Past Surgical History:  Procedure Laterality Date   COLONOSCOPY N/A 07/18/2017   Procedure: COLONOSCOPY;  Surgeon: West Bali, MD;  [facility];  Service: Endoscopy;  Laterality: N/A;  2:15 pm   MENISCUS REPAIR [facility]/2020   [facility]   POLYPECTOMY  07/18/2017   Procedure: POLYPECTOMY;  Surgeon: West Bali, MD;  [facility];  Service: Endoscopy;;  hepatic flexure   PROSTATE BIOPSY     in office   RADIOACTIVE  SEED IMPLANT N/A 11/24/2020   Procedure: RADIOACTIVE SEED IMPLANT/BRACHYTHERAPY IMPLANT;  Surgeon: Bjorn Pippin, MD;  Location: [facility];  Service: Urology;  Laterality: N/A;   SPACE OAR INSTILLATION N/A 11/24/2020   Procedure: SPACE OAR INSTILLATION;  Surgeon: Bjorn Pippin, MD;  Location: [facility];  Service: Urology;  Laterality: N/A;    Social History   Socioeconomic History   Marital status: Married    Spouse name: Not on file   Number of children: 2   Years of education: Not on file   Highest education level: Not on file  Occupational History   Occupation: [employer]    Comment: full time; plans to retire again in May  Tobacco Use   Smoking status: Former    Current packs/day: 0.00    Average packs/day: 1 pack/day for 28.0 years (28.0 ttl pk-yrs)    Types: Cigarettes    Start date: 09/16/1972    Quit date: 09/16/2000    Years since quitting: 23.1   Smokeless tobacco: Never  Vaping Use   Vaping status: Never Used  Substance and Sexual Activity   Alcohol use: No   Drug use: No    Sexual activity: Yes  Other Topics Concern   Not on file  Social History Narrative   Not on file   Social Drivers of Health   Financial Resource Strain: Not on file  Food Insecurity: Not on file  Transportation Needs: Not on file  Physical Activity: Not on file  Stress: Not on file  Social Connections: Not on file  Intimate Partner Violence: Not on file    Family History  Problem Relation Age of Onset   Hypertension Mother    Stroke Father    Prostate cancer Brother    Throat cancer Brother    Prostate cancer Brother    Colon cancer Neg Hx    Colon polyps Neg Hx    Breast cancer Neg Hx    Pancreatic cancer Neg Hx     Anti-infectives: Anti-infectives (From admission, onward)    None       Current Outpatient Medications  Medication Sig Dispense Refill   HYDROcodone-acetaminophen (NORCO) 10-325 MG tablet Take 1 tablet by mouth every 6 (six) hours as needed for moderate pain., 60 tablet 0   methocarbamol (ROBAXIN) 500 MG tablet Take 500 mg by mouth 4 (four) times daily.     Multiple Vitamin (MULTIVITAMIN WITH MINERALS) TABS tablet Take 1 tablet by mouth daily.     nabumetone (RELAFEN) 500 MG tablet TAKE ONE TABLET TWICE A DAY WITH FOOD AS NEEDED. 42 tablet 3   tamsulosin (FLOMAX) 0.4 MG CAPS capsule Take 1 capsule (0.4 mg total) by mouth daily after supper. 90 capsule 3   No current facility-administered medications for this visit.     Objective: Vital signs in last 24 hours: BP 130/79   Pulse 69   Intake/Output from previous day: No intake/output data recorded. Intake/Output this shift: @IOTHISSHIFT @   Physical Exam Vitals reviewed.  Constitutional:      Appearance: Normal appearance.  Neurological:     Mental Status: He is alert.      Studies/Results:  Lab Results  Component Value Date   PSA1 0.2 10/24/2023   PSA1 0.4 04/03/2023   PSA1 0.5 10/03/2022   Results for orders placed or performed in visit on 11/06/23 (from the past 24 hours)   Urinalysis, Routine w reflex microscopic     Status: None   Collection Time: 11/06/23  3:27 PM  Result Value Ref Range   Specific Gravity, UA 1.025 1.005 - 1.030   pH, UA 5.5 5.0 - 7.5   Color, UA Yellow Yellow   Appearance Ur Clear Clear   Leukocytes,UA Negative Negative   Protein,UA Negative Negative/Trace   Glucose, UA Negative Negative   Ketones, UA Negative Negative   RBC, UA Negative Negative   Bilirubin, UA Negative Negative   Urobilinogen, Ur 0.2 0.2 - 1.0 mg/dL   Nitrite, UA Negative Negative   Microscopic Examination Comment    Narrative   Performed at:  [address] - [facility] [address], [place], [place]  [id number] Lab Director: Chinita Pester MT, Phone:  [phone]       UA is clear  Assessment/Plan: Prostate cancer:  T1c Nx Mx Gleason 7(3+4) prostate cancer.  He is doing well s/p seeds with a further decline in the PSA.  He will return in 6 months with a PSA.      BPH with BOO.  He is voiding ok on tamsulosin   No orders of the defined types were placed in this encounter.     Orders Placed This Encounter  Procedures   Urinalysis, Routine w reflex microscopic   PSA    Standing Status:   Future    Expected Date:   05/05/2024    Expiration Date:   11/05/2024     Return in about 6 months (around 05/05/2024) for with sarah with PSA. Marland Kitchen    CC: Dr. Laroy Apple    Bjorn Pippin 11/07/2023 [phone] ID: Clotilde Dieter, male   DOB: Dec 16, 1955, 68 y.o.   MRN: [id number]

## 2023-11-07 LAB — URINALYSIS, ROUTINE W REFLEX MICROSCOPIC
Bilirubin, UA: NEGATIVE
Glucose, UA: NEGATIVE
Ketones, UA: NEGATIVE
Leukocytes,UA: NEGATIVE
Nitrite, UA: NEGATIVE
Protein,UA: NEGATIVE
RBC, UA: NEGATIVE
Specific Gravity, UA: 1.025 (ref 1.005–1.030)
Urobilinogen, Ur: 0.2 mg/dL (ref 0.2–1.0)
pH, UA: 5.5 (ref 5.0–7.5)

## 2023-12-04 DIAGNOSIS — Z5181 Encounter for therapeutic drug level monitoring: Secondary | ICD-10-CM | POA: Diagnosis not present

## 2023-12-04 DIAGNOSIS — M51362 Other intervertebral disc degeneration, lumbar region with discogenic back pain and lower extremity pain: Secondary | ICD-10-CM | POA: Diagnosis not present

## 2023-12-04 DIAGNOSIS — M5416 Radiculopathy, lumbar region: Secondary | ICD-10-CM | POA: Diagnosis not present

## 2023-12-04 DIAGNOSIS — M549 Dorsalgia, unspecified: Secondary | ICD-10-CM | POA: Diagnosis not present

## 2023-12-04 DIAGNOSIS — Z79899 Other long term (current) drug therapy: Secondary | ICD-10-CM | POA: Diagnosis not present

## 2023-12-25 DIAGNOSIS — H01002 Unspecified blepharitis right lower eyelid: Secondary | ICD-10-CM | POA: Diagnosis not present

## 2023-12-25 DIAGNOSIS — H01004 Unspecified blepharitis left upper eyelid: Secondary | ICD-10-CM | POA: Diagnosis not present

## 2023-12-25 DIAGNOSIS — H25813 Combined forms of age-related cataract, bilateral: Secondary | ICD-10-CM | POA: Diagnosis not present

## 2023-12-25 DIAGNOSIS — H01001 Unspecified blepharitis right upper eyelid: Secondary | ICD-10-CM | POA: Diagnosis not present

## 2023-12-26 ENCOUNTER — Ambulatory Visit: Payer: Medicare Other

## 2023-12-26 VITALS — Ht 73.0 in | Wt 218.0 lb

## 2023-12-26 DIAGNOSIS — Z0000 Encounter for general adult medical examination without abnormal findings: Secondary | ICD-10-CM | POA: Diagnosis not present

## 2023-12-26 NOTE — Patient Instructions (Signed)
 Mr. Ricardo Pacheco , Thank you for taking time to come for your Medicare Wellness Visit. I appreciate your ongoing commitment to your health goals. Please review the following plan we discussed and let me know if I can assist you in the future.   Referrals/Orders/Follow-Ups/Clinician Recommendations: Aim for 30 minutes of exercise or brisk walking, 6-8 glasses of water, and 5 servings of fruits and vegetables each day.  This is a list of the screening recommended for you and due dates:  Health Maintenance  Topic Date Due   Hepatitis C Screening  Never done   COVID-19 Vaccine (7 - 2024-25 season) 05/18/2023   Flu Shot  04/16/2024   Medicare Annual Wellness Visit  12/25/2024   Colon Cancer Screening  07/19/2027   DTaP/Tdap/Td vaccine (3 - Td or Tdap) 07/05/2029   Pneumonia Vaccine  Completed   Zoster (Shingles) Vaccine  Completed   HPV Vaccine  Aged Out   Meningitis B Vaccine  Aged Out    Advanced directives: (ACP Link)Information on Advanced Care Planning can be found at [facility] Advance Health Care Directives Advance Health Care Directives. [email or web address]   Next Medicare Annual Wellness Visit scheduled for next year: Yes

## 2023-12-26 NOTE — Progress Notes (Signed)
 Subjective:   Ricardo Pacheco is a 68 y.o. who presents for a Medicare Wellness preventive visit.  Visit Complete: Virtual I connected with  Ricardo Pacheco on 12/26/23 by a audio enabled telemedicine application and verified that I am speaking with the correct person using two identifiers.  Patient Location: Home  Provider Location: Home Office  I discussed the limitations of evaluation and management by telemedicine. The patient expressed understanding and agreed to proceed.  Vital Signs: Because this visit was a virtual/telehealth visit, some criteria may be missing or patient reported. Any vitals not documented were not able to be obtained and vitals that have been documented are patient reported.  VideoDeclined- This patient declined Librarian, academic. Therefore the visit was completed with audio only.  Persons Participating in Visit: Patient.  AWV Questionnaire: Yes: Patient Medicare AWV questionnaire was completed by the patient on 12/22/23; I have confirmed that all information answered by patient is correct and no changes since this date.  Cardiac Risk Factors include: advanced age (>74men, >29 women);male gender;dyslipidemia     Objective:    Today's Vitals   12/26/23 1049  Weight: 218 lb (98.9 kg)  Height: 6\' 1"  (1.854 m)   Body mass index is 28.76 kg/m.     12/26/2023   10:54 AM 12/18/2020   11:45 AM 11/24/2020   10:04 AM 09/29/2020    4:56 PM 09/26/2020   10:55 AM 04/07/2020    1:59 PM 07/18/2017    1:31 PM  Advanced Directives  Does Patient Have a Medical Advance Directive? No No No No No No No  Would patient like information on creating a medical advance directive? Yes (MAU/Ambulatory/Procedural Areas - Information given)  Yes (MAU/Ambulatory/Procedural Areas - Information given) No - Patient declined No - Patient declined No - Patient declined Yes (MAU/Ambulatory/Procedural Areas - Information given)    Current Medications  (verified) Outpatient Encounter Medications as of 12/26/2023  Medication Sig   HYDROcodone-acetaminophen (NORCO) 10-325 MG tablet Take 1 tablet by mouth every 6 (six) hours as needed for moderate pain.,   methocarbamol (ROBAXIN) 500 MG tablet Take 500 mg by mouth 4 (four) times daily.   Multiple Vitamin (MULTIVITAMIN WITH MINERALS) TABS tablet Take 1 tablet by mouth daily.   nabumetone (RELAFEN) 500 MG tablet TAKE ONE TABLET TWICE A DAY WITH FOOD AS NEEDED.   tamsulosin (FLOMAX) 0.4 MG CAPS capsule Take 1 capsule (0.4 mg total) by mouth daily after supper.   No facility-administered encounter medications on file as of 12/26/2023.    Allergies (verified) Patient has no known allergies.   History: Past Medical History:  Diagnosis Date   Arthritis    djd back back brace prn   Cataracts, bilateral    Decreased white blood cell count 2018   resolved    Hypercholesteremia    no meds taken   Polyp of hepatic flexure of colon    Prostate cancer (HCC)    Prostate cancer (HCC)    Wears glasses    Wears partial dentures    lower   Past Surgical History:  Procedure Laterality Date   COLONOSCOPY N/A 07/18/2017   Procedure: COLONOSCOPY;  Surgeon: West Bali, MD;  [facility];  Service: Endoscopy;  Laterality: N/A;  2:15 pm   MENISCUS REPAIR Left 03/2020   [facility]   POLYPECTOMY  07/18/2017   Procedure: POLYPECTOMY;  Surgeon: West Bali, MD;  [facility];  Service: Endoscopy;;  hepatic flexure  PROSTATE BIOPSY     in office   RADIOACTIVE SEED IMPLANT N/A 11/24/2020   Procedure: RADIOACTIVE SEED IMPLANT/BRACHYTHERAPY IMPLANT;  Surgeon: Bjorn Pippin, MD;  Location[facility];  Service: Urology;  Laterality: N/A;   SPACE OAR INSTILLATION N/A 11/24/2020   Procedure: SPACE OAR INSTILLATION;  Surgeon: Bjorn Pippin, MD;  Location: [facility];  Service: Urology;  Laterality: N/A;   Family History  Problem Relation Age  of Onset   Hypertension Mother    Stroke Father    Prostate cancer Brother    Throat cancer Brother    Prostate cancer Brother    Colon cancer Neg Hx    Colon polyps Neg Hx    Breast cancer Neg Hx    Pancreatic cancer Neg Hx    Social History   Socioeconomic History   Marital status: Married    Spouse name: Not on file   Number of children: 2   Years of education: Not on file   Highest education level: 12th grade  Occupational History   Occupation: [employer]    Comment: full time; plans to retire again in May  Tobacco Use   Smoking status: Former    Current packs/day: 0.00    Average packs/day: 1 pack/day for 28.0 years (28.0 ttl pk-yrs)    Types: Cigarettes    Start date: 09/16/1972    Quit date: 09/16/2000    Years since quitting: 23.2   Smokeless tobacco: Never  Vaping Use   Vaping status: Never Used  Substance and Sexual Activity   Alcohol use: No   Drug use: No   Sexual activity: Yes  Other Topics Concern   Not on file  Social History Narrative   Not on file   Social Drivers of Health   Financial Resource Strain: Low Risk  (12/22/2023)   Overall Financial Resource Strain (CARDIA)    Difficulty of Paying Living Expenses: Not hard at all  Food Insecurity: No Food Insecurity (12/22/2023)   Hunger Vital Sign    Worried About Running Out of Food in the Last Year: Never true    Ran Out of Food in the Last Year: Never true  Transportation Needs: No Transportation Needs (12/22/2023)   PRAPARE - Administrator, Civil Service (Medical): No    Lack of Transportation (Non-Medical): No  Physical Activity: Insufficiently Active (12/22/2023)   Exercise Vital Sign    Days of Exercise per Week: 2 days    Minutes of Exercise per Session: 30 min  Stress: No Stress Concern Present (12/22/2023)   [employer] of Occupational Health - Occupational Stress Questionnaire    Feeling of Stress : Not at all  Social Connections: Moderately Isolated (12/22/2023)   Social  Connection and Isolation Panel [NHANES]    Frequency of Communication with Friends and Family: Once a week    Frequency of Social Gatherings with Friends and Family: Once a week    Attends Religious Services: More than 4 times per year    Active Member of [employer] or Organizations: No    Attends Engineer, structural: Never    Marital Status: Married    Tobacco Counseling Counseling given: Not Answered    Clinical Intake:  Pre-visit preparation completed: Yes  Pain : No/denies pain     Diabetes: No  No results found for: "HGBA1C"   How often do you need to have someone help you when you read instructions, pamphlets, or other written materials from your  doctor or pharmacy?: 1 - Never  Interpreter Needed?: No  Information entered by :: Kandis Fantasia LPN   Activities of Daily Living     12/26/2023   10:49 AM  In your present state of health, do you have any difficulty performing the following activities:  Hearing? 0  Vision? 0  Difficulty concentrating or making decisions? 0  Walking or climbing stairs? 0  Dressing or bathing? 0  Doing errands, shopping? 0  Preparing Food and eating ? N  Using the Toilet? N  In the past six months, have you accidently leaked urine? N  Do you have problems with loss of bowel control? N  Managing your Medications? N  Managing your Finances? N  Housekeeping or managing your Housekeeping? N    Patient Care Team: Tommie Sams, DO as PCP - General (Family Medicine) Felicita Gage, RN as Oncology Nurse Navigator Clinic, Ala Bent, MD as Consulting Physician (Physical Medicine and Rehabilitation) Bjorn Pippin, MD as Attending Physician (Urology) Daisy Lazar, DO (Optometry)  Indicate any recent Medical Services you may have received from other than [facility] providers in the past year (date may be approximate).     Assessment:   This is a routine wellness examination for [place].  Hearing/Vision screen Hearing  Screening - Comments:: Denies hearing difficulties   Vision Screening - Comments:: Wears rx glasses - up to date with routine eye exams with Dr. Charise Killian @ [facility]. [facility]    Goals Addressed             This Visit's Progress    Remain active and independent         Depression Screen     12/26/2023   10:50 AM 11/06/2022    9:39 AM 08/06/2022    8:59 AM 05/15/2021    8:53 AM 07/06/2019   10:54 AM 05/30/2017    9:23 AM  PHQ 2/9 Scores  PHQ - 2 Score 0 0 1 0 0 0  PHQ- 9 Score  1 1       Fall Risk     12/26/2023   10:54 AM 11/06/2022    9:39 AM 08/06/2022    8:52 AM 05/15/2021    8:53 AM 05/29/2020    8:28 AM  Fall Risk   Falls in the past year? 0 0 0 0 0  Number falls in past yr: 0 0 0 0   Injury with Fall? 0 0 0 0   Risk for fall due to : No Fall Risks No Fall Risks No Fall Risks No Fall Risks   Follow up Falls prevention discussed;Education provided;Falls evaluation completed Falls evaluation completed Falls evaluation completed Falls evaluation completed Falls evaluation completed    MEDICARE RISK AT HOME:  Medicare Risk at Home Any stairs in or around the home?: No If so, are there any without handrails?: No Home free of loose throw rugs in walkways, pet beds, electrical cords, etc?: Yes Adequate lighting in your home to reduce risk of falls?: Yes Life alert?: No Use of a cane, walker or w/c?: No Grab bars in the bathroom?: Yes Shower chair or bench in shower?: No Elevated toilet seat or a handicapped toilet?: Yes  TIMED UP AND GO:  Was the test performed?  No  Cognitive Function: 6CIT completed        12/26/2023   10:54 AM  6CIT Screen  What Year? 0 points  What month? 0 points  What time? 0 points  Count back from 20  0 points  Months in reverse 0 points  Repeat phrase 0 points  Total Score 0 points    Immunizations Immunization History  Administered Date(s) Administered   Fluad Quad(high Dose 65+) 06/15/2021   Influenza,inj,Quad PF,6+ Mos  07/06/2019, 08/22/2022   Influenza-Unspecified 06/09/2013, 06/08/2014, 06/08/2015, 06/05/2016, 05/29/2017, 06/16/2018, 07/27/2020, 06/15/2021, 07/25/2023   Moderna Covid-19 Vaccine Bivalent Booster 55yrs & up 06/15/2021   Moderna Sars-Covid-2 Vaccination 11/15/2019, 12/13/2019, 09/15/2020   PFIZER(Purple Top)SARS-COV-2 Vaccination 11/21/2019, 12/12/2019   PNEUMOCOCCAL CONJUGATE-20 08/22/2022   Td 05/07/2005   Tdap 07/06/2019   Zoster Recombinant(Shingrix) 05/27/2022, 07/25/2022   Zoster, Live 05/17/2016    Screening Tests Health Maintenance  Topic Date Due   Hepatitis C Screening  Never done   COVID-19 Vaccine (7 - 2024-25 season) 05/18/2023   INFLUENZA VACCINE  04/16/2024   Medicare Annual Wellness (AWV)  12/25/2024   Colonoscopy  07/19/2027   DTaP/Tdap/Td (3 - Td or Tdap) 07/05/2029   Pneumonia Vaccine 39+ Years old  Completed   Zoster Vaccines- Shingrix  Completed   HPV VACCINES  Aged Out   Meningococcal B Vaccine  Aged Out    Health Maintenance  Health Maintenance Due  Topic Date Due   Hepatitis C Screening  Never done   COVID-19 Vaccine (7 - 2024-25 season) 05/18/2023    Additional Screening:  Vision Screening: Recommended annual ophthalmology exams for early detection of glaucoma and other disorders of the eye.  Dental Screening: Recommended annual dental exams for proper oral hygiene  Community Resource Referral / Chronic Care Management: CRR required this visit?  No   CCM required this visit?  No     Plan:     I have personally reviewed and noted the following in the patient's chart:   Medical and social history Use of alcohol, tobacco or illicit drugs  Current medications and supplements including opioid prescriptions. Patient is not currently taking opioid prescriptions. Functional ability and status Nutritional status Physical activity Advanced directives List of other physicians Hospitalizations, surgeries, and ER visits in previous 12  months Vitals Screenings to include cognitive, depression, and falls Referrals and appointments  In addition, I have reviewed and discussed with patient certain preventive protocols, quality metrics, and best practice recommendations. A written personalized care plan for preventive services as well as general preventive health recommendations were provided to patient.     Kandis Fantasia [place], [place]   1/61/0960   After Visit Summary: (MyChart) Due to this being a telephonic visit, the after visit summary with patients personalized plan was offered to patient via MyChart   Notes: Nothing significant to report at this time.

## 2023-12-27 DIAGNOSIS — M5416 Radiculopathy, lumbar region: Secondary | ICD-10-CM | POA: Diagnosis not present

## 2024-02-05 DIAGNOSIS — H25812 Combined forms of age-related cataract, left eye: Secondary | ICD-10-CM | POA: Diagnosis not present

## 2024-02-11 NOTE — H&P (Signed)
 Surgical History & Physical  Patient Name: Ricardo Pacheco  DOB: 07-Feb-1956  Surgery: Cataract extraction with intraocular lens implant phacoemulsification; Left Eye Surgeon: [name] MD Surgery Date: 02/13/2024 Pre-Op Date: 12/25/2023  HPI: A 10 Yr. old male patient  1. The patient is here for Cataract eval Ref: Dr. Barbra Boone. Pt. complains of difficulty when viewing TV, reading closed caption, news scrolls on TV. Both eyes are affected. The episode is constant. Symptoms occur when the patient is driving and reading. The complaint is associated with blurry vision. This is negatively affecting the patients quality of life and the patient is unable to function adequately in life with the current level of vision.  HPI was performed by [name] .  Medical History: Cataracts  Chronic back pain. knee Musculoskeletal Pain  Review of Systems Musculoskeletal chronic back and knee pains All recorded systems are negative except as noted above.  Social Never smoked   Medication Hydrocodone -acetaminophen , Methocarbamol, Nabumetone , Tamsulosin , Prednisolone acetate, Ilevro, Moxifloxacin  Sx/Procedures Knee Arthroscopy, Prostate  Drug Allergies  NKDA  History & Physical: Heent: cataracts NECK: supple without bruits LUNGS: lungs clear to auscultation CV: regular rate and rhythm Abdomen: soft and non-tender  Impression & Plan: Assessment: 1.  COMBINED FORMS AGE RELATED CATARACT; Both Eyes (H25.813) 2.  BLEPHARITIS; Right Upper Lid, Right Lower Lid, Left Upper Lid, Left Lower Lid (H01.001, H01.002,H01.004,H01.005) 3.  DERMATOCHALASIS, no surgery; Right Upper Lid, Left Upper Lid (H02.831, H02.834) 4.  Pinguecula; Both Eyes (H11.153) 5.  ASTIGMATISM, REGULAR; Both Eyes (H52.223)  Plan: 1.  Cataract accounts for the patient's decreased vision. This visual impairment is not correctable with a tolerable change in glasses or contact lenses. Cataract surgery with an implantation of a new  lens should significantly improve the visual and functional status of the patient. Discussed all risks, benefits, alternatives, and potential complications. Discussed the procedures and recovery. Patient desires to have surgery. A-scan ordered and performed today for intra-ocular lens calculations. The surgery will be performed in order to improve vision for driving, reading, and for eye examinations. Recommend phacoemulsification with intra-ocular lens. Recommend Dextenza  for post-operative pain and inflammation. left eye worse - first Dilates well - shugarcaine by protocol. Patient is myopic - wishes to keep myopia - set goal for -2.50OU.  2.  Blepharitis is present - recommend regular lid cleaning.  3.  Asymptomatic, recommend observation for now. Findings, prognosis and treatment options reviewed.  4.  Observe; Artificial tears as needed for irritation.  5.  Do not need to treat due to near goal.

## 2024-02-12 ENCOUNTER — Other Ambulatory Visit: Payer: Self-pay

## 2024-02-12 ENCOUNTER — Encounter (HOSPITAL_COMMUNITY)
Admission: RE | Admit: 2024-02-12 | Discharge: 2024-02-12 | Disposition: A | Discharge status: Home / Self Care | Source: Ambulatory Visit | Attending: Ophthalmology | Admitting: Ophthalmology

## 2024-02-12 ENCOUNTER — Encounter (HOSPITAL_COMMUNITY): Payer: Self-pay

## 2024-02-13 ENCOUNTER — Ambulatory Visit (HOSPITAL_COMMUNITY): Admitting: Anesthesiology

## 2024-02-13 ENCOUNTER — Ambulatory Visit (HOSPITAL_COMMUNITY)
Admission: RE | Admit: 2024-02-13 | Discharge: 2024-02-13 | Disposition: A | Discharge status: Home / Self Care | Attending: Ophthalmology | Admitting: Ophthalmology

## 2024-02-13 ENCOUNTER — Encounter (HOSPITAL_COMMUNITY)
Admission: RE | Disposition: A | Discharge status: Home / Self Care | Payer: Self-pay | Source: Home / Self Care | Attending: Ophthalmology

## 2024-02-13 ENCOUNTER — Encounter (HOSPITAL_COMMUNITY): Payer: Self-pay | Admitting: Ophthalmology

## 2024-02-13 DIAGNOSIS — H0100A Unspecified blepharitis right eye, upper and lower eyelids: Secondary | ICD-10-CM | POA: Insufficient documentation

## 2024-02-13 DIAGNOSIS — I10 Essential (primary) hypertension: Secondary | ICD-10-CM | POA: Diagnosis not present

## 2024-02-13 DIAGNOSIS — H02834 Dermatochalasis of left upper eyelid: Secondary | ICD-10-CM | POA: Insufficient documentation

## 2024-02-13 DIAGNOSIS — H52223 Regular astigmatism, bilateral: Secondary | ICD-10-CM | POA: Diagnosis not present

## 2024-02-13 DIAGNOSIS — H02831 Dermatochalasis of right upper eyelid: Secondary | ICD-10-CM | POA: Insufficient documentation

## 2024-02-13 DIAGNOSIS — H25812 Combined forms of age-related cataract, left eye: Secondary | ICD-10-CM | POA: Diagnosis not present

## 2024-02-13 DIAGNOSIS — H0100B Unspecified blepharitis left eye, upper and lower eyelids: Secondary | ICD-10-CM | POA: Insufficient documentation

## 2024-02-13 DIAGNOSIS — Z87891 Personal history of nicotine dependence: Secondary | ICD-10-CM | POA: Diagnosis not present

## 2024-02-13 DIAGNOSIS — H5712 Ocular pain, left eye: Secondary | ICD-10-CM | POA: Insufficient documentation

## 2024-02-13 HISTORY — PX: INSERTION, STENT, DRUG-ELUTING, LACRIMAL CANALICULUS: SHX7453

## 2024-02-13 HISTORY — PX: CATARACT EXTRACTION W/PHACO: SHX586

## 2024-02-13 SURGERY — PHACOEMULSIFICATION, CATARACT, WITH IOL INSERTION
Anesthesia: Monitor Anesthesia Care | Site: Eye | Laterality: Left

## 2024-02-13 MED ORDER — MIDAZOLAM HCL 5 MG/5ML IJ SOLN
INTRAMUSCULAR | Status: DC | PRN
  Administered 2024-02-13: 1.5 mg via INTRAVENOUS

## 2024-02-13 MED ORDER — MOXIFLOXACIN HCL 5 MG/ML IO SOLN
INTRAOCULAR | Status: DC | PRN
  Administered 2024-02-13: .2 mL via INTRACAMERAL

## 2024-02-13 MED ORDER — LIDOCAINE HCL (PF) 1 % IJ SOLN
INTRAMUSCULAR | Status: DC | PRN
  Administered 2024-02-13: 1 mL

## 2024-02-13 MED ORDER — TROPICAMIDE 1 % OP SOLN
1.0000 [drp] | OPHTHALMIC | Status: AC | PRN
  Administered 2024-02-13 (×3): 1 [drp] via OPHTHALMIC

## 2024-02-13 MED ORDER — BSS IO SOLN
INTRAOCULAR | Status: DC | PRN
  Administered 2024-02-13: 15 mL via INTRAOCULAR

## 2024-02-13 MED ORDER — MIDAZOLAM HCL 2 MG/2ML IJ SOLN
INTRAMUSCULAR | Status: AC
  Filled 2024-02-13: qty 2

## 2024-02-13 MED ORDER — LIDOCAINE HCL 3.5 % OP GEL
1.0000 | Freq: Once | OPHTHALMIC | Status: AC
  Administered 2024-02-13: 1 via OPHTHALMIC

## 2024-02-13 MED ORDER — SODIUM HYALURONATE 10 MG/ML IO SOLUTION
PREFILLED_SYRINGE | INTRAOCULAR | Status: DC | PRN
Start: 2024-02-13 — End: 2024-02-13
  Administered 2024-02-13: .85 mL via INTRAOCULAR

## 2024-02-13 MED ORDER — SODIUM HYALURONATE 23MG/ML IO SOSY
PREFILLED_SYRINGE | INTRAOCULAR | Status: DC | PRN
Start: 2024-02-13 — End: 2024-02-13
  Administered 2024-02-13: .6 mL via INTRAOCULAR

## 2024-02-13 MED ORDER — DEXAMETHASONE 0.4 MG OP INST
VAGINAL_INSERT | OPHTHALMIC | Status: DC | PRN
  Administered 2024-02-13: .4 mg via OPHTHALMIC

## 2024-02-13 MED ORDER — PHENYLEPHRINE HCL 2.5 % OP SOLN
1.0000 [drp] | OPHTHALMIC | Status: AC | PRN
Start: 2024-02-13 — End: 2024-02-13
  Administered 2024-02-13 (×3): 1 [drp] via OPHTHALMIC

## 2024-02-13 MED ORDER — PHENYLEPHRINE-KETOROLAC 1-0.3 % IO SOLN
INTRAOCULAR | Status: DC | PRN
  Administered 2024-02-13: 500 mL via OPHTHALMIC

## 2024-02-13 MED ORDER — STERILE WATER FOR IRRIGATION IR SOLN
Status: DC | PRN
  Administered 2024-02-13: 200 mL

## 2024-02-13 MED ORDER — TETRACAINE HCL 0.5 % OP SOLN
1.0000 [drp] | OPHTHALMIC | Status: AC | PRN
  Administered 2024-02-13 (×3): 1 [drp] via OPHTHALMIC

## 2024-02-13 MED ORDER — POVIDONE-IODINE 5 % OP SOLN
OPHTHALMIC | Status: DC | PRN
Start: 2024-02-13 — End: 2024-02-13
  Administered 2024-02-13: 1 via OPHTHALMIC

## 2024-02-13 MED ORDER — LACTATED RINGERS IV SOLN: INTRAVENOUS | Status: DC

## 2024-02-13 SURGICAL SUPPLY — 12 items
CATARACT SUITE SIGHTPATH (MISCELLANEOUS) ×1 IMPLANT
CLOTH BEACON ORANGE TIMEOUT ST (SAFETY) ×2 IMPLANT
EYE SHIELD UNIVERSAL CLEAR (GAUZE/BANDAGES/DRESSINGS) IMPLANT
FEE CATARACT SUITE SIGHTPATH (MISCELLANEOUS) ×2 IMPLANT
GLOVE BIOGEL PI IND STRL 7.0 (GLOVE) ×4 IMPLANT
LENS IOL TECNIS EYHANCE 25.0 (Intraocular Lens) IMPLANT
NDL HYPO 18GX1.5 BLUNT FILL (NEEDLE) ×2 IMPLANT
NEEDLE HYPO 18GX1.5 BLUNT FILL (NEEDLE) ×1 IMPLANT
PAD ARMBOARD POSITIONER FOAM (MISCELLANEOUS) ×2 IMPLANT
SYR TB 1ML LL NO SAFETY (SYRINGE) ×2 IMPLANT
TAPE SURG TRANSPORE 1 IN (GAUZE/BANDAGES/DRESSINGS) IMPLANT
WATER STERILE IRR 250ML POUR (IV SOLUTION) ×2 IMPLANT

## 2024-02-13 NOTE — Discharge Instructions (Addendum)
 Please discharge patient when stable, will follow up today with Dr. June Leap at the [facility] office immediately following discharge.  Leave shield in place until visit.  All paperwork with discharge instructions will be given at the office.  [facility] Address:  [address]  [place], [place] [address]

## 2024-02-13 NOTE — Transfer of Care (Signed)
 Immediate Anesthesia Transfer of Care Note  Patient: Ricardo Pacheco.  Procedure(s) Performed: PHACOEMULSIFICATION, CATARACT, WITH IOL INSERTION (Left: Eye) INSERTION, STENT, DRUG-ELUTING, LACRIMAL CANALICULUS (Left: Eye)  Patient Location: PACU  Anesthesia Type:General  Level of Consciousness: awake and alert   Airway & Oxygen Therapy: Patient Spontanous Breathing and Patient connected to nasal cannula oxygen  Post-op Assessment: Report given to RN and Post -op Vital signs reviewed and stable  Post vital signs: Reviewed and stable  Last Vitals:  Vitals Value Taken Time  BP 122/79 02/13/24 1244  Temp 36.4 C 02/13/24 1244  Pulse 69 02/13/24 1244  Resp 16 02/13/24 1244  SpO2 98 % 02/13/24 1244    Last Pain:  Vitals:   02/13/24 1244  TempSrc: Oral  PainSc: 0-No pain         Complications: No notable events documented.

## 2024-02-13 NOTE — Op Note (Signed)
 Date of procedure: 02/13/24  Pre-operative diagnosis: Visually significant age-related combined cataract, Left Eye (H25.812)  Post-operative diagnosis:  Visually significant age-related combined cataract, Left Eye (H25.812) 2.   Pain and inflammation following cataract surgery, Left Eye (H57.12)  Procedure:  Removal of cataract via phacoemulsification and insertion of intra-ocular lens Johnson and Johnson DIB00 +25.0D into the capsular bag of the Left Eye 2. Placement of Dextenza  Implant, Left Lower Lid  Attending surgeon: Pleas Brill. Evalisse Prajapati, MD, MA  Anesthesia: MAC, Topical Akten  Complications: None  Estimated Blood Loss: <58mL (minimal)  Specimens: None  Implants: As above  Indications:  Visually significant age-related cataract, Left Eye  Procedure:  The patient was seen and identified in the pre-operative area. The operative eye was identified and dilated.  The operative eye was marked.  Topical anesthesia was administered to the operative eye.     The patient was then to the operative suite and placed in the supine position.  A timeout was performed confirming the patient, procedure to be performed, and all other relevant information.   The patient's face was prepped and draped in the usual fashion for intra-ocular surgery.  A lid speculum was placed into the operative eye and the surgical microscope moved into place and focused.  An inferotemporal paracentesis was created using a 20 gauge paracentesis blade.  Shugarcaine was injected into the anterior chamber.  Viscoelastic was injected into the anterior chamber.  A temporal clear-corneal main wound incision was created using a 2.59mm microkeratome.  A continuous curvilinear capsulorrhexis was initiated using an irrigating cystitome and completed using capsulorrhexis forceps.  Hydrodissection and hydrodeliniation were performed.  Viscoelastic was injected into the anterior chamber.  A phacoemulsification handpiece and a chopper as a  second instrument were used to remove the nucleus and epinucleus. The irrigation/aspiration handpiece was used to remove any remaining cortical material.   The capsular bag was reinflated with viscoelastic, checked, and found to be intact.  The intraocular lens was inserted into the capsular bag.  The irrigation/aspiration handpiece was used to remove any remaining viscoelastic.  The clear corneal wound and paracentesis wounds were then hydrated and checked with Weck-Cels to be watertight.  0.1mL of moxifloxacin was injected into the anterior chamber.  The lid-speculum was removed. The lower punctum was dilated. A Dextenza  implant was placed in the lower canaliculus without complication.   The drape was removed.  The patient's face was cleaned with a wet and dry 4x4.   A clear shield was taped over the eye. The patient was taken to the post-operative care unit in good condition, having tolerated the procedure well.  Post-Op Instructions: The patient will follow up at [facility] for a same day post-operative evaluation and will receive all other orders and instructions.

## 2024-02-13 NOTE — Anesthesia Preprocedure Evaluation (Signed)
Anesthesia Evaluation  Patient identified by MRN, date of birth, ID band Patient awake    Reviewed: Allergy & Precautions, H&P , NPO status , Patient's Chart, lab work & pertinent test results, reviewed documented beta blocker date and time   Airway Mallampati: II  TM Distance: >3 FB Neck ROM: full    Dental no notable dental hx.    Pulmonary neg pulmonary ROS, former smoker   Pulmonary exam normal breath sounds clear to auscultation       Cardiovascular Exercise Tolerance: Good hypertension, negative cardio ROS  Rhythm:regular Rate:Normal     Neuro/Psych negative neurological ROS  negative psych ROS   GI/Hepatic negative GI ROS, Neg liver ROS,,,  Endo/Other  negative endocrine ROS    Renal/GU negative Renal ROS  negative genitourinary   Musculoskeletal   Abdominal   Peds  Hematology negative hematology ROS (+)   Anesthesia Other Findings   Reproductive/Obstetrics negative OB ROS                             Anesthesia Physical Anesthesia Plan  ASA: 2  Anesthesia Plan: MAC   Post-op Pain Management:    Induction:   PONV Risk Score and Plan:   Airway Management Planned:   Additional Equipment:   Intra-op Plan:   Post-operative Plan:   Informed Consent: I have reviewed the patients History and Physical, chart, labs and discussed the procedure including the risks, benefits and alternatives for the proposed anesthesia with the patient or authorized representative who has indicated his/her understanding and acceptance.     Dental Advisory Given  Plan Discussed with: CRNA  Anesthesia Plan Comments:         Anesthesia Quick Evaluation  

## 2024-02-13 NOTE — Interval H&P Note (Signed)
 History and Physical Interval Note:  02/13/2024 12:15 PM  Ricardo Pacheco.  has presented today for surgery, with the diagnosis of combined forms age related cataract, left eye.  The various methods of treatment have been discussed with the patient and family. After consideration of risks, benefits and other options for treatment, the patient has consented to  Procedure(s) with comments: PHACOEMULSIFICATION, CATARACT, WITH IOL INSERTION (Left) - CDE: INSERTION, STENT, DRUG-ELUTING, LACRIMAL CANALICULUS (Left) as a surgical intervention.  The patient's history has been reviewed, patient examined, no change in status, stable for surgery.  I have reviewed the patient's chart and labs.  Questions were answered to the patient's satisfaction.     [name]

## 2024-02-15 NOTE — Anesthesia Postprocedure Evaluation (Signed)
 Anesthesia Post Note  Patient: Ricardo Virrueta Jr.  Procedure(s) Performed: PHACOEMULSIFICATION, CATARACT, WITH IOL INSERTION (Left: Eye) INSERTION, STENT, DRUG-ELUTING, LACRIMAL CANALICULUS (Left: Eye)  Patient location during evaluation: Phase II Anesthesia Type: MAC Level of consciousness: awake Pain management: pain level controlled Vital Signs Assessment: post-procedure vital signs reviewed and stable Respiratory status: spontaneous breathing and respiratory function stable Cardiovascular status: blood pressure returned to baseline and stable Postop Assessment: no headache and no apparent nausea or vomiting Anesthetic complications: no Comments: Late entry   No notable events documented.   Last Vitals:  Vitals:   02/13/24 1145 02/13/24 1244  BP: 101/70 122/79  Pulse:  69  Resp: 16 16  Temp: 36.7 C 36.4 C  SpO2: 98% 98%    Last Pain:  Vitals:   02/13/24 1244  TempSrc: Oral  PainSc: 0-No pain                 Coretha Dew

## 2024-02-16 ENCOUNTER — Encounter (HOSPITAL_COMMUNITY): Payer: Self-pay | Admitting: Ophthalmology

## 2024-02-17 DIAGNOSIS — I10 Essential (primary) hypertension: Secondary | ICD-10-CM

## 2024-02-17 DIAGNOSIS — H25812 Combined forms of age-related cataract, left eye: Secondary | ICD-10-CM

## 2024-03-01 ENCOUNTER — Encounter (HOSPITAL_COMMUNITY)
Admission: RE | Admit: 2024-03-01 | Discharge: 2024-03-01 | Disposition: A | Discharge status: Home / Self Care | Source: Ambulatory Visit | Attending: Ophthalmology | Admitting: Ophthalmology

## 2024-03-01 ENCOUNTER — Encounter (HOSPITAL_COMMUNITY): Payer: Self-pay

## 2024-03-01 DIAGNOSIS — H25811 Combined forms of age-related cataract, right eye: Secondary | ICD-10-CM | POA: Diagnosis not present

## 2024-03-04 NOTE — H&P (Signed)
 Surgical History & Physical  Patient Name: Ricardo Pacheco  DOB: Apr 30, 1956  Surgery: Cataract extraction with intraocular lens implant phacoemulsification; Right Eye Surgeon: [name] MD Surgery Date: 03/05/2024 Pre-Op Date: 02/16/2024  HPI: A 82 Yr. old male patient 1.  The patient is returning for a cataract follow-up of the left eye. Since the last visit, the affected area is doing well. The patient's vision is stable. The condition's severity is constant. Patient is following medication instructions. Pt. is ready to proceed with cataract surgery in OD, due to blurred far distance and glare. This is negatively affecting the patient's quality of life and the patient is unable to function adequately in life with the current level of vision. HPI Completed by Dr. [name]  Medical History: Cataracts  Chronic back pain. Knee  Review of Systems Musculoskeletal chronic back and knee pains All recorded systems are negative except as noted above.  Social Never smoked   Medication Prednisolone-moxiflox-bromfen,  Hydrocodone -acetaminophen , Methocarbamol, Nabumetone , Tamsulosin   Sx/Procedures Knee Arthroscopy, Prostate, Phaco c IOL OS - dextenza   Drug Allergies  NKDA  History & Physical: Heent: cataract NECK: supple without bruits LUNGS: lungs clear to auscultation CV: regular rate and rhythm Abdomen: soft and non-tender  Impression & Plan: Assessment: 1.  CATARACT EXTRACTION STATUS; Left Eye (Z98.42) 2.  COMBINED FORMS AGE RELATED CATARACT; Right Eye (H25.811)  Plan: 1.  3 days after cataract surgery. Doing well - vision improved. IOP slightly above normal. Continue Combo drops 3x/day for 4 more days then stop - Dextenza .  2.  Cataract accounts for the patient's decreased vision. This visual impairment is not correctable with a tolerable change in glasses or contact lenses. Cataract surgery with an implantation of a new lens should significantly improve the visual and  functional status of the patient. Discussed all risks, benefits, alternatives, and potential complications. Discussed the procedures and recovery. Patient desires to have surgery. A-scan ordered and performed today for intra-ocular lens calculations. The surgery will be performed in order to improve vision for driving, reading, and for eye examinations. Recommend phacoemulsification with intra-ocular lens. Recommend Dextenza  for post-operative pain and inflammation. Right eye. Dilates well - shugarcaine by protocol. Patient is myopic - wishes to keep myopia - set goal for -2.50OU.

## 2024-03-05 ENCOUNTER — Ambulatory Visit (HOSPITAL_COMMUNITY): Admitting: Anesthesiology

## 2024-03-05 ENCOUNTER — Ambulatory Visit (HOSPITAL_COMMUNITY)
Admission: RE | Admit: 2024-03-05 | Discharge: 2024-03-05 | Disposition: A | Discharge status: Home / Self Care | Attending: Ophthalmology | Admitting: Ophthalmology

## 2024-03-05 ENCOUNTER — Encounter (HOSPITAL_COMMUNITY)
Admission: RE | Disposition: A | Discharge status: Home / Self Care | Payer: Self-pay | Source: Home / Self Care | Attending: Ophthalmology

## 2024-03-05 ENCOUNTER — Ambulatory Visit (HOSPITAL_BASED_OUTPATIENT_CLINIC_OR_DEPARTMENT_OTHER): Admitting: Anesthesiology

## 2024-03-05 DIAGNOSIS — H5711 Ocular pain, right eye: Secondary | ICD-10-CM | POA: Diagnosis not present

## 2024-03-05 DIAGNOSIS — H25811 Combined forms of age-related cataract, right eye: Secondary | ICD-10-CM | POA: Diagnosis not present

## 2024-03-05 DIAGNOSIS — I10 Essential (primary) hypertension: Secondary | ICD-10-CM

## 2024-03-05 DIAGNOSIS — Z87891 Personal history of nicotine dependence: Secondary | ICD-10-CM | POA: Insufficient documentation

## 2024-03-05 HISTORY — PX: INSERTION, STENT, DRUG-ELUTING, LACRIMAL CANALICULUS: SHX7453

## 2024-03-05 HISTORY — PX: CATARACT EXTRACTION W/PHACO: SHX586

## 2024-03-05 SURGERY — PHACOEMULSIFICATION, CATARACT, WITH IOL INSERTION
Anesthesia: Monitor Anesthesia Care | Site: Eye | Laterality: Right

## 2024-03-05 MED ORDER — TROPICAMIDE 1 % OP SOLN
1.0000 [drp] | OPHTHALMIC | Status: AC | PRN
  Administered 2024-03-05 (×3): 1 [drp] via OPHTHALMIC

## 2024-03-05 MED ORDER — PHENYLEPHRINE-KETOROLAC 1-0.3 % IO SOLN
INTRAOCULAR | Status: DC | PRN
  Administered 2024-03-05: 500 mL via OPHTHALMIC

## 2024-03-05 MED ORDER — DEXAMETHASONE 0.4 MG OP INST
VAGINAL_INSERT | OPHTHALMIC | Status: AC
Start: 2024-03-05 — End: 2024-03-05
  Filled 2024-03-05: qty 1

## 2024-03-05 MED ORDER — TETRACAINE HCL 0.5 % OP SOLN
1.0000 [drp] | OPHTHALMIC | Status: AC | PRN
  Administered 2024-03-05 (×3): 1 [drp] via OPHTHALMIC

## 2024-03-05 MED ORDER — BSS IO SOLN
INTRAOCULAR | Status: DC | PRN
  Administered 2024-03-05: 15 mL via INTRAOCULAR

## 2024-03-05 MED ORDER — SODIUM HYALURONATE 10 MG/ML IO SOLUTION
PREFILLED_SYRINGE | INTRAOCULAR | Status: DC | PRN
  Administered 2024-03-05: .85 mL via INTRAOCULAR

## 2024-03-05 MED ORDER — MIDAZOLAM HCL 2 MG/2ML IJ SOLN
INTRAMUSCULAR | Status: AC
  Filled 2024-03-05: qty 2

## 2024-03-05 MED ORDER — MOXIFLOXACIN HCL 5 MG/ML IO SOLN
INTRAOCULAR | Status: DC | PRN
  Administered 2024-03-05: .2 mL via OPHTHALMIC

## 2024-03-05 MED ORDER — DEXAMETHASONE 0.4 MG OP INST
VAGINAL_INSERT | OPHTHALMIC | Status: DC | PRN
  Administered 2024-03-05: .4 mg via OPHTHALMIC

## 2024-03-05 MED ORDER — MIDAZOLAM HCL 2 MG/2ML IJ SOLN
INTRAMUSCULAR | Status: DC | PRN
Start: 2024-03-05 — End: 2024-03-05
  Administered 2024-03-05: 1 mg via INTRAVENOUS

## 2024-03-05 MED ORDER — SODIUM HYALURONATE 23MG/ML IO SOSY
PREFILLED_SYRINGE | INTRAOCULAR | Status: DC | PRN
  Administered 2024-03-05: .6 mL via INTRAOCULAR

## 2024-03-05 MED ORDER — STERILE WATER FOR IRRIGATION IR SOLN
Status: DC | PRN
  Administered 2024-03-05: 250 mL

## 2024-03-05 MED ORDER — SODIUM CHLORIDE 0.9% FLUSH
INTRAVENOUS | Status: DC | PRN
Start: 2024-03-05 — End: 2024-03-05
  Administered 2024-03-05: 8 mL via INTRAVENOUS

## 2024-03-05 MED ORDER — POVIDONE-IODINE 5 % OP SOLN
OPHTHALMIC | Status: DC | PRN
  Administered 2024-03-05: 1 via OPHTHALMIC

## 2024-03-05 MED ORDER — LACTATED RINGERS IV SOLN: INTRAVENOUS | Status: DC

## 2024-03-05 MED ORDER — PHENYLEPHRINE HCL 2.5 % OP SOLN
1.0000 [drp] | OPHTHALMIC | Status: AC | PRN
  Administered 2024-03-05 (×3): 1 [drp] via OPHTHALMIC

## 2024-03-05 MED ORDER — LIDOCAINE HCL 3.5 % OP GEL
1.0000 | Freq: Once | OPHTHALMIC | Status: AC
  Administered 2024-03-05: 1 via OPHTHALMIC

## 2024-03-05 SURGICAL SUPPLY — 12 items
CATARACT SUITE SIGHTPATH (MISCELLANEOUS) ×2 IMPLANT
CLOTH BEACON ORANGE TIMEOUT ST (SAFETY) ×3 IMPLANT
EYE SHIELD UNIVERSAL CLEAR (GAUZE/BANDAGES/DRESSINGS) IMPLANT
FEE CATARACT SUITE SIGHTPATH (MISCELLANEOUS) ×3 IMPLANT
GLOVE BIOGEL PI IND STRL 7.0 (GLOVE) ×6 IMPLANT
LENS IOL TECNIS EYHANCE 24.0 (Intraocular Lens) IMPLANT
NDL HYPO 18GX1.5 BLUNT FILL (NEEDLE) ×3 IMPLANT
NEEDLE HYPO 18GX1.5 BLUNT FILL (NEEDLE) ×2 IMPLANT
PAD ARMBOARD POSITIONER FOAM (MISCELLANEOUS) ×3 IMPLANT
SYR TB 1ML LL NO SAFETY (SYRINGE) ×3 IMPLANT
TAPE SURG TRANSPORE 1 IN (GAUZE/BANDAGES/DRESSINGS) IMPLANT
WATER STERILE IRR 250ML POUR (IV SOLUTION) ×3 IMPLANT

## 2024-03-05 NOTE — Transfer of Care (Signed)
 Immediate Anesthesia Transfer of Care Note  Patient: Ricardo Pacheco.  Procedure(s) Performed: PHACOEMULSIFICATION, CATARACT, WITH IOL INSERTION (Right: Eye) INSERTION, STENT, DRUG-ELUTING, LACRIMAL CANALICULUS (Right)  Patient Location: Short Stay  Anesthesia Type:MAC  Level of Consciousness: awake, alert , and oriented  Airway & Oxygen Therapy: Patient Spontanous Breathing  Post-op Assessment: Report given to RN and Post -op Vital signs reviewed and stable  Post vital signs: Reviewed and stable  Last Vitals:  Vitals Value Taken Time  BP    Temp    Pulse    Resp    SpO2      Last Pain:  Vitals:   03/05/24 0728  TempSrc: Oral  PainSc: 4       Patients Stated Pain Goal: 8 (03/05/24 0728)  Complications: No notable events documented.

## 2024-03-05 NOTE — Interval H&P Note (Signed)
 History and Physical Interval Note:  03/05/2024 8:19 AM  Ricardo Pacheco.  has presented today for surgery, with the diagnosis of combined forms age related cataract, right eye.  The various methods of treatment have been discussed with the patient and family. After consideration of risks, benefits and other options for treatment, the patient has consented to  Procedure(s) with comments: PHACOEMULSIFICATION, CATARACT, WITH IOL INSERTION (Right) - CDE: INSERTION, STENT, DRUG-ELUTING, LACRIMAL CANALICULUS (Right) as a surgical intervention.  The patient's history has been reviewed, patient examined, no change in status, stable for surgery.  I have reviewed the patient's chart and labs.  Questions were answered to the patient's satisfaction.     Ricardo Pacheco

## 2024-03-05 NOTE — Op Note (Signed)
 Date of procedure: 03/05/24  Pre-operative diagnosis:  Visually significant combined form age-related cataract, Right Eye (H25.811)  Post-operative diagnosis:   1. Visually significant combined form age-related cataract, Right Eye (H25.811) 2. Pain and inflammation following cataract surgery Right Eye (H57.11)  Procedure:  Removal of cataract via phacoemulsification and insertion of intra-ocular lens Johnson and Johnson DIB00 +24.0D into the capsular bag of the Right Eye 2. Placement of Dextenza  insert, Right Eye  Attending surgeon: Pleas Brill. Wiletta Bermingham, MD, MA  Anesthesia: MAC, Topical Akten   Complications: None  Estimated Blood Loss: <71mL (minimal)  Specimens: None  Implants: As above  Indications:  Visually significant age-related cataract, Right Eye  Procedure:  The patient was seen and identified in the pre-operative area. The operative eye was identified and dilated.  The operative eye was marked.  Topical anesthesia was administered to the operative eye.     The patient was then to the operative suite and placed in the supine position.  A timeout was performed confirming the patient, procedure to be performed, and all other relevant information.   The patient's face was prepped and draped in the usual fashion for intra-ocular surgery.  A lid speculum was placed into the operative eye and the surgical microscope moved into place and focused.  A superotemporal paracentesis was created using a 20 gauge paracentesis blade. Omidria  was injected into the anterior chamber. Shugarcaine was injected into the anterior chamber.  Viscoelastic was injected into the anterior chamber.  A temporal clear-corneal main wound incision was created using a 2.10mm microkeratome.  A continuous curvilinear capsulorrhexis was initiated using an irrigating cystitome and completed using capsulorrhexis forceps.  Hydrodissection and hydrodeliniation were performed.  Viscoelastic was injected into the anterior  chamber.  A phacoemulsification handpiece and a chopper as a second instrument were used to remove the nucleus and epinucleus. The irrigation/aspiration handpiece was used to remove any remaining cortical material.   The capsular bag was reinflated with viscoelastic, checked, and found to be intact.  The intraocular lens was inserted into the capsular bag.  The irrigation/aspiration handpiece was used to remove any remaining viscoelastic.  The clear corneal wound and paracentesis wounds were then hydrated and checked with Weck-Cels to be watertight. 0.1mL of moxifloxacin  was injected into the anterior chamber.  The lid-speculum was removed. The lower punctum was dilated. A Dextenza  implant was placed in the lower canaliculus without complication.  The drape was removed.  The patient's face was cleaned with a wet and dry 4x4. A clear shield was taped over the eye. The patient was taken to the post-operative care unit in good condition, having tolerated the procedure well.  Post-Op Instructions: The patient will follow up at [facility] for a same day post-operative evaluation and will receive all other orders and instructions.

## 2024-03-05 NOTE — Discharge Instructions (Signed)
 Please discharge patient when stable, will follow up today with Dr. June Leap at the [facility] office immediately following discharge.  Leave shield in place until visit.  All paperwork with discharge instructions will be given at the office.  [facility] Address:  [address]  [place], [place] [address]

## 2024-03-05 NOTE — Anesthesia Procedure Notes (Signed)
 Procedure Name: MAC Date/Time: 03/05/2024 8:22 AM  Performed by: Sherwin Donate, CRNAPre-anesthesia Checklist: Patient identified, Emergency Drugs available, Suction available and Patient being monitored Patient Re-evaluated:Patient Re-evaluated prior to induction Oxygen Delivery Method: Nasal cannula Placement Confirmation: positive ETCO2

## 2024-03-05 NOTE — Anesthesia Preprocedure Evaluation (Signed)
Anesthesia Evaluation  Patient identified by MRN, date of birth, ID band Patient awake    Reviewed: Allergy & Precautions, H&P , NPO status , Patient's Chart, lab work & pertinent test results, reviewed documented beta blocker date and time   Airway Mallampati: II  TM Distance: >3 FB Neck ROM: full    Dental no notable dental hx.    Pulmonary neg pulmonary ROS, former smoker   Pulmonary exam normal breath sounds clear to auscultation       Cardiovascular Exercise Tolerance: Good hypertension, negative cardio ROS  Rhythm:regular Rate:Normal     Neuro/Psych negative neurological ROS  negative psych ROS   GI/Hepatic negative GI ROS, Neg liver ROS,,,  Endo/Other  negative endocrine ROS    Renal/GU negative Renal ROS  negative genitourinary   Musculoskeletal   Abdominal   Peds  Hematology negative hematology ROS (+)   Anesthesia Other Findings   Reproductive/Obstetrics negative OB ROS                             Anesthesia Physical Anesthesia Plan  ASA: 2  Anesthesia Plan: MAC   Post-op Pain Management:    Induction:   PONV Risk Score and Plan:   Airway Management Planned:   Additional Equipment:   Intra-op Plan:   Post-operative Plan:   Informed Consent: I have reviewed the patients History and Physical, chart, labs and discussed the procedure including the risks, benefits and alternatives for the proposed anesthesia with the patient or authorized representative who has indicated his/her understanding and acceptance.     Dental Advisory Given  Plan Discussed with: CRNA  Anesthesia Plan Comments:         Anesthesia Quick Evaluation  

## 2024-03-06 NOTE — Anesthesia Postprocedure Evaluation (Signed)
 Anesthesia Post Note  Patient: Ricardo Walmsley Jr.  Procedure(s) Performed: PHACOEMULSIFICATION, CATARACT, WITH IOL INSERTION (Right: Eye) INSERTION, STENT, DRUG-ELUTING, LACRIMAL CANALICULUS (Right)  Patient location during evaluation: Phase II Anesthesia Type: MAC Level of consciousness: awake Pain management: pain level controlled Vital Signs Assessment: post-procedure vital signs reviewed and stable Respiratory status: spontaneous breathing and respiratory function stable Cardiovascular status: blood pressure returned to baseline and stable Postop Assessment: no headache and no apparent nausea or vomiting Anesthetic complications: no Comments: Late entry   No notable events documented.   Last Vitals:  Vitals:   03/05/24 0728 03/05/24 0844  BP: 119/66 118/84  Pulse: 70 69  Resp:  14  Temp: 36.5 C 36.4 C  SpO2: 97% 97%    Last Pain:  Vitals:   03/05/24 0844  TempSrc: Oral  PainSc: 0-No pain                 Yvonna JINNY Bosworth

## 2024-03-08 ENCOUNTER — Encounter (HOSPITAL_COMMUNITY): Payer: Self-pay | Admitting: Ophthalmology

## 2024-04-30 ENCOUNTER — Other Ambulatory Visit: Payer: Medicare Other

## 2024-05-06 ENCOUNTER — Ambulatory Visit: Payer: Medicare Other | Admitting: Urology

## 2024-05-26 DIAGNOSIS — Z5181 Encounter for therapeutic drug level monitoring: Secondary | ICD-10-CM | POA: Diagnosis not present

## 2024-05-26 DIAGNOSIS — M5416 Radiculopathy, lumbar region: Secondary | ICD-10-CM | POA: Diagnosis not present

## 2024-06-08 DIAGNOSIS — M5416 Radiculopathy, lumbar region: Secondary | ICD-10-CM | POA: Diagnosis not present

## 2024-06-30 ENCOUNTER — Other Ambulatory Visit

## 2024-06-30 DIAGNOSIS — N138 Other obstructive and reflux uropathy: Secondary | ICD-10-CM | POA: Diagnosis not present

## 2024-06-30 DIAGNOSIS — Z8546 Personal history of malignant neoplasm of prostate: Secondary | ICD-10-CM

## 2024-07-01 LAB — PSA: Prostate Specific Ag, Serum: 0.2 ng/mL (ref 0.0–4.0)

## 2024-07-02 ENCOUNTER — Ambulatory Visit: Payer: Self-pay

## 2024-07-07 ENCOUNTER — Other Ambulatory Visit: Payer: Self-pay

## 2024-07-07 ENCOUNTER — Ambulatory Visit: Admitting: Urology

## 2024-07-07 VITALS — BP 127/74 | HR 76

## 2024-07-07 DIAGNOSIS — Z8546 Personal history of malignant neoplasm of prostate: Secondary | ICD-10-CM | POA: Diagnosis not present

## 2024-07-07 DIAGNOSIS — N401 Enlarged prostate with lower urinary tract symptoms: Secondary | ICD-10-CM | POA: Diagnosis not present

## 2024-07-07 DIAGNOSIS — R351 Nocturia: Secondary | ICD-10-CM

## 2024-07-07 DIAGNOSIS — N138 Other obstructive and reflux uropathy: Secondary | ICD-10-CM

## 2024-07-07 LAB — URINALYSIS, ROUTINE W REFLEX MICROSCOPIC
Bilirubin, UA: NEGATIVE
Glucose, UA: NEGATIVE
Ketones, UA: NEGATIVE
Leukocytes,UA: NEGATIVE
Nitrite, UA: NEGATIVE
Protein,UA: NEGATIVE
RBC, UA: NEGATIVE
Specific Gravity, UA: 1.01 (ref 1.005–1.030)
Urobilinogen, Ur: 0.2 mg/dL (ref 0.2–1.0)
pH, UA: 7 (ref 5.0–7.5)

## 2024-07-07 MED ORDER — TAMSULOSIN HCL 0.4 MG PO CAPS: 0.4000 mg | ORAL_CAPSULE | Freq: Every day | ORAL | 3 refills | Status: DC

## 2024-07-07 MED ORDER — TAMSULOSIN HCL 0.4 MG PO CAPS: 0.4000 mg | ORAL_CAPSULE | Freq: Every day | ORAL | 3 refills | Status: AC

## 2024-07-07 NOTE — Progress Notes (Signed)
 07/07/2024 2:02 PM   Ricardo Pacheco. 06-Apr-1956 [id number]  Referring provider: Cook, Jayce G, DO [address] [place],  [place] [address]  Followup BPH and prostate cancer   HPI: Ricardo Pacheco is a 68yo here for followup for BPH and prostate cancer. He underwent brachytherapy in 11/2020. PSA 0.2. IPSS 5 QOL 3 on flomax  0.4mg  daily. Nocturia 1-3x depending on fluid consumption. Uirne stream strong. No straining to urinate.    PMH: Past Medical History:  Diagnosis Date   Arthritis    djd back back brace prn   Cataracts, bilateral    Decreased white blood cell count 2018   resolved    Hypercholesteremia    no meds taken   Polyp of hepatic flexure of colon    Prostate cancer (HCC)    Prostate cancer (HCC)    Wears glasses    Wears partial dentures    lower    Surgical History: Past Surgical History:  Procedure Laterality Date   CATARACT EXTRACTION W/PHACO Left 02/13/2024   Procedure: PHACOEMULSIFICATION, CATARACT, WITH IOL INSERTION;  Surgeon: Ricardo Agent, MD;  Location: [facility];  Service: Ophthalmology;  Laterality: Left;  CDE: 5.52   CATARACT EXTRACTION W/PHACO Right 03/05/2024   Procedure: PHACOEMULSIFICATION, CATARACT, WITH IOL INSERTION;  Surgeon: Ricardo Agent, MD;  Location: [facility];  Service: Ophthalmology;  Laterality: Right;  CDE: 5.57   COLONOSCOPY N/A 07/18/2017   Procedure: COLONOSCOPY;  Surgeon: Ricardo Margo CROME, MD;  Location: [facility];  Service: Endoscopy;  Laterality: N/A;  2:15 pm   INSERTION, STENT, DRUG-ELUTING, LACRIMAL CANALICULUS Left 02/13/2024   Procedure: INSERTION, STENT, DRUG-ELUTING, LACRIMAL CANALICULUS;  Surgeon: Ricardo Agent, MD;  Location: [facility];  Service: Ophthalmology;  Laterality: Left;   INSERTION, STENT, DRUG-ELUTING, LACRIMAL CANALICULUS Right 03/05/2024   Procedure: INSERTION, STENT, DRUG-ELUTING, LACRIMAL CANALICULUS;  Surgeon: Ricardo Agent, MD;  Location: [facility];  Service: Ophthalmology;  Laterality: Right;   MENISCUS  REPAIR Left 03/2020   [facility]   POLYPECTOMY  07/18/2017   Procedure: POLYPECTOMY;  Surgeon: Ricardo Margo CROME, MD;  Location: [facility];  Service: Endoscopy;;  hepatic flexure   PROSTATE BIOPSY     in office   RADIOACTIVE SEED IMPLANT N/A 11/24/2020   Procedure: RADIOACTIVE SEED IMPLANT/BRACHYTHERAPY IMPLANT;  Surgeon: Ricardo Rush, MD;  Location: [facility];  Service: Urology;  Laterality: N/A;   SPACE OAR INSTILLATION N/A 11/24/2020   Procedure: SPACE OAR INSTILLATION;  Surgeon: Ricardo Rush, MD;  Location: [facility];  Service: Urology;  Laterality: N/A;    Home Medications:  Allergies as of 07/07/2024   No Known Allergies      Medication List        Accurate as of July 07, 2024  2:02 PM. If you have any questions, ask your nurse or doctor.          HYDROcodone -acetaminophen  10-325 MG tablet Commonly known as: NORCO Take 1 tablet by mouth every 6 (six) hours as needed for moderate pain.,   methocarbamol 500 MG tablet Commonly known as: ROBAXIN Take 500 mg by mouth 4 (four) times daily.   multivitamin with minerals Tabs tablet Take 1 tablet by mouth daily.   nabumetone  500 MG tablet Commonly known as: RELAFEN  TAKE ONE TABLET TWICE A DAY WITH FOOD AS NEEDED.   tamsulosin  0.4 MG Caps capsule Commonly known as: FLOMAX  Take 1 capsule (0.4 mg total) by mouth daily after supper.  Allergies: No Known Allergies  Family History: Family History  Problem Relation Age of Onset   Hypertension Mother    Stroke Father    Prostate cancer Brother    Throat cancer Brother    Prostate cancer Brother    Colon cancer Neg Hx    Colon polyps Neg Hx    Breast cancer Neg Hx    Pancreatic cancer Neg Hx     Social History:  reports that he quit smoking about 23 years ago. His smoking use included cigarettes. He started smoking about 51 years ago. He has a 28 pack-year smoking history. He has never used smokeless tobacco.  He reports that he does not drink alcohol and does not use drugs.  ROS: All other review of systems were reviewed and are negative except what is noted above in HPI  Physical Exam: BP 127/74   Pulse 76   Constitutional:  Alert and oriented, No acute distress. HEENT:  AT, moist mucus membranes.  Trachea midline, no masses. Cardiovascular: No clubbing, cyanosis, or edema. Respiratory: Normal respiratory effort, no increased work of breathing. GI: Abdomen is soft, nontender, nondistended, no abdominal masses GU: No CVA tenderness.  Lymph: No cervical or inguinal lymphadenopathy. Skin: No rashes, bruises or suspicious lesions. Neurologic: Grossly intact, no focal deficits, moving all 4 extremities. Psychiatric: Normal mood and affect.  Laboratory Data: Lab Results  Component Value Date   WBC 3.3 (L) 11/22/2020   HGB 13.1 11/22/2020   HCT 41.3 11/22/2020   MCV 88.2 11/22/2020   PLT 336 11/22/2020    Lab Results  Component Value Date   CREATININE 0.78 11/22/2020    Lab Results  Component Value Date   PSA 1.35 05/06/2014   PSA 1.42 04/24/2013    No results found for: TESTOSTERONE  No results found for: HGBA1C  Urinalysis    Component Value Date/Time   APPEARANCEUR Clear 11/06/2023 1527   GLUCOSEU Negative 11/06/2023 1527   BILIRUBINUR Negative 11/06/2023 1527   PROTEINUR Negative 11/06/2023 1527   NITRITE Negative 11/06/2023 1527   LEUKOCYTESUR Negative 11/06/2023 1527    Lab Results  Component Value Date   LABMICR Comment 11/06/2023    Pertinent Imaging:  No results found for this or any previous visit.  No results found for this or any previous visit.  No results found for this or any previous visit.  No results found for this or any previous visit.  No results found for this or any previous visit.  No results found for this or any previous visit.  No results found for this or any previous visit.  No results found for this or any previous  visit.   Assessment & Plan:    1. BPH with urinary obstruction (Primary) Flomax  0.4mg  daily - Urinalysis, Routine w reflex microscopic  2. Nocturia Flomax  0.4mg  daily  3. Personal history of prostate cancer Followup 1 year with a PSA   No follow-ups on file.  [name] Clara, MD  [facility] [place]

## 2024-07-13 ENCOUNTER — Encounter: Payer: Self-pay | Admitting: Urology

## 2024-07-13 NOTE — Patient Instructions (Signed)

## 2024-12-31 ENCOUNTER — Ambulatory Visit

## 2025-06-27 ENCOUNTER — Other Ambulatory Visit

## 2025-07-08 ENCOUNTER — Ambulatory Visit: Admitting: Urology
